# Patient Record
Sex: Female | Born: 1940
Health system: Southern US, Community
[De-identification: ages and names within clinical notes are randomized; demographics above are authoritative.]

## PROBLEM LIST (undated history)

## (undated) DIAGNOSIS — M1990 Unspecified osteoarthritis, unspecified site: Secondary | ICD-10-CM

## (undated) DIAGNOSIS — F3289 Other specified depressive episodes: Secondary | ICD-10-CM

## (undated) DIAGNOSIS — M899 Disorder of bone, unspecified: Secondary | ICD-10-CM

## (undated) DIAGNOSIS — T7840XA Allergy, unspecified, initial encounter: Secondary | ICD-10-CM

## (undated) DIAGNOSIS — K635 Polyp of colon: Secondary | ICD-10-CM

## (undated) DIAGNOSIS — K649 Unspecified hemorrhoids: Secondary | ICD-10-CM

## (undated) DIAGNOSIS — K5790 Diverticulosis of intestine, part unspecified, without perforation or abscess without bleeding: Secondary | ICD-10-CM

## (undated) DIAGNOSIS — E079 Disorder of thyroid, unspecified: Secondary | ICD-10-CM

## (undated) DIAGNOSIS — K219 Gastro-esophageal reflux disease without esophagitis: Secondary | ICD-10-CM

## (undated) DIAGNOSIS — K8020 Calculus of gallbladder without cholecystitis without obstruction: Secondary | ICD-10-CM

## (undated) DIAGNOSIS — K644 Residual hemorrhoidal skin tags: Secondary | ICD-10-CM

## (undated) DIAGNOSIS — E7800 Pure hypercholesterolemia, unspecified: Secondary | ICD-10-CM

## (undated) DIAGNOSIS — F411 Generalized anxiety disorder: Secondary | ICD-10-CM

## (undated) DIAGNOSIS — D649 Anemia, unspecified: Secondary | ICD-10-CM

## (undated) DIAGNOSIS — H409 Unspecified glaucoma: Secondary | ICD-10-CM

## (undated) DIAGNOSIS — H269 Unspecified cataract: Secondary | ICD-10-CM

## (undated) DIAGNOSIS — F329 Major depressive disorder, single episode, unspecified: Secondary | ICD-10-CM

## (undated) DIAGNOSIS — I10 Essential (primary) hypertension: Secondary | ICD-10-CM

## (undated) DIAGNOSIS — R7303 Prediabetes: Secondary | ICD-10-CM

## (undated) DIAGNOSIS — J309 Allergic rhinitis, unspecified: Secondary | ICD-10-CM

## (undated) DIAGNOSIS — M949 Disorder of cartilage, unspecified: Secondary | ICD-10-CM

## (undated) DIAGNOSIS — N6019 Diffuse cystic mastopathy of unspecified breast: Secondary | ICD-10-CM

## (undated) HISTORY — DX: Allergy, unspecified, initial encounter: T78.40XA

## (undated) HISTORY — DX: Unspecified cataract: H26.9

## (undated) HISTORY — PX: BREAST EXCISIONAL BIOPSY: SUR124

## (undated) HISTORY — DX: Generalized anxiety disorder: F41.1

## (undated) HISTORY — DX: Disorder of bone, unspecified: M94.9

## (undated) HISTORY — DX: Gastro-esophageal reflux disease without esophagitis: K21.9

## (undated) HISTORY — DX: Anemia, unspecified: D64.9

## (undated) HISTORY — DX: Unspecified glaucoma: H40.9

## (undated) HISTORY — DX: Other specified depressive episodes: F32.89

## (undated) HISTORY — DX: Disorder of thyroid, unspecified: E07.9

## (undated) HISTORY — DX: Unspecified hemorrhoids: K64.9

## (undated) HISTORY — DX: Polyp of colon: K63.5

## (undated) HISTORY — PX: TONSILLECTOMY: SUR1361

## (undated) HISTORY — DX: Diverticulosis of intestine, part unspecified, without perforation or abscess without bleeding: K57.90

## (undated) HISTORY — DX: Calculus of gallbladder without cholecystitis without obstruction: K80.20

## (undated) HISTORY — DX: Allergic rhinitis, unspecified: J30.9

## (undated) HISTORY — DX: Pure hypercholesterolemia, unspecified: E78.00

## (undated) HISTORY — DX: Essential (primary) hypertension: I10

## (undated) HISTORY — DX: Unspecified osteoarthritis, unspecified site: M19.90

## (undated) HISTORY — DX: Major depressive disorder, single episode, unspecified: F32.9

## (undated) HISTORY — PX: POLYPECTOMY: SHX149

## (undated) HISTORY — PX: CHOLECYSTECTOMY: SHX55

## (undated) HISTORY — DX: Diffuse cystic mastopathy of unspecified breast: N60.19

## (undated) HISTORY — DX: Disorder of bone, unspecified: M89.9

## (undated) HISTORY — DX: Residual hemorrhoidal skin tags: K64.4

## (undated) HISTORY — PX: HAND SURGERY: SHX662

## (undated) HISTORY — DX: Disorder of cartilage, unspecified: M89.9

## (undated) HISTORY — PX: HEMORRHOID SURGERY: SHX153

---

## 1998-06-16 ENCOUNTER — Other Ambulatory Visit: Admission: RE | Admit: 1998-06-16 | Discharge: 1998-06-16 | Payer: Self-pay | Admitting: Obstetrics and Gynecology

## 1999-05-02 ENCOUNTER — Other Ambulatory Visit: Admission: RE | Admit: 1999-05-02 | Discharge: 1999-05-02 | Payer: Self-pay | Admitting: Obstetrics and Gynecology

## 1999-05-02 ENCOUNTER — Encounter (INDEPENDENT_AMBULATORY_CARE_PROVIDER_SITE_OTHER): Payer: Self-pay | Admitting: Specialist

## 1999-10-14 HISTORY — PX: BUNIONECTOMY: SHX129

## 2000-03-18 ENCOUNTER — Other Ambulatory Visit: Admission: RE | Admit: 2000-03-18 | Discharge: 2000-03-18 | Payer: Self-pay | Admitting: Obstetrics and Gynecology

## 2000-06-13 HISTORY — PX: OTHER SURGICAL HISTORY: SHX169

## 2001-05-21 ENCOUNTER — Other Ambulatory Visit: Admission: RE | Admit: 2001-05-21 | Discharge: 2001-05-21 | Payer: Self-pay | Admitting: Family Medicine

## 2002-01-13 HISTORY — PX: COLONOSCOPY: SHX174

## 2003-04-16 HISTORY — PX: OTHER SURGICAL HISTORY: SHX169

## 2003-05-04 ENCOUNTER — Other Ambulatory Visit: Admission: RE | Admit: 2003-05-04 | Discharge: 2003-05-04 | Payer: Self-pay | Admitting: Family Medicine

## 2003-07-07 ENCOUNTER — Other Ambulatory Visit: Payer: Self-pay

## 2004-03-07 ENCOUNTER — Ambulatory Visit (HOSPITAL_COMMUNITY): Admission: RE | Admit: 2004-03-07 | Discharge: 2004-03-07 | Payer: Self-pay | Admitting: Obstetrics and Gynecology

## 2004-03-07 ENCOUNTER — Encounter (INDEPENDENT_AMBULATORY_CARE_PROVIDER_SITE_OTHER): Payer: Self-pay | Admitting: Specialist

## 2004-06-06 ENCOUNTER — Ambulatory Visit: Payer: Self-pay | Admitting: Family Medicine

## 2004-07-04 ENCOUNTER — Other Ambulatory Visit: Admission: RE | Admit: 2004-07-04 | Discharge: 2004-07-04 | Payer: Self-pay | Admitting: Family Medicine

## 2004-07-04 ENCOUNTER — Ambulatory Visit: Payer: Self-pay | Admitting: Family Medicine

## 2004-07-04 LAB — CONVERTED CEMR LAB: Pap Smear: NORMAL

## 2004-07-10 ENCOUNTER — Ambulatory Visit: Payer: Self-pay | Admitting: Family Medicine

## 2004-07-20 ENCOUNTER — Ambulatory Visit: Payer: Self-pay | Admitting: Family Medicine

## 2005-01-04 ENCOUNTER — Ambulatory Visit: Payer: Self-pay | Admitting: Family Medicine

## 2005-02-14 ENCOUNTER — Ambulatory Visit: Payer: Self-pay | Admitting: Family Medicine

## 2005-03-27 ENCOUNTER — Ambulatory Visit: Payer: Self-pay | Admitting: Family Medicine

## 2005-04-26 ENCOUNTER — Ambulatory Visit: Payer: Self-pay | Admitting: Internal Medicine

## 2005-05-10 ENCOUNTER — Ambulatory Visit: Payer: Self-pay | Admitting: Internal Medicine

## 2005-08-26 ENCOUNTER — Ambulatory Visit: Payer: Self-pay | Admitting: Family Medicine

## 2005-11-13 ENCOUNTER — Ambulatory Visit: Payer: Self-pay | Admitting: Family Medicine

## 2006-03-13 ENCOUNTER — Ambulatory Visit: Payer: Self-pay | Admitting: Family Medicine

## 2006-04-15 DIAGNOSIS — N6019 Diffuse cystic mastopathy of unspecified breast: Secondary | ICD-10-CM

## 2006-04-15 HISTORY — DX: Diffuse cystic mastopathy of unspecified breast: N60.19

## 2006-10-24 ENCOUNTER — Encounter: Payer: Self-pay | Admitting: Family Medicine

## 2006-10-24 DIAGNOSIS — F411 Generalized anxiety disorder: Secondary | ICD-10-CM | POA: Insufficient documentation

## 2006-10-24 DIAGNOSIS — K649 Unspecified hemorrhoids: Secondary | ICD-10-CM | POA: Insufficient documentation

## 2006-10-24 DIAGNOSIS — M1990 Unspecified osteoarthritis, unspecified site: Secondary | ICD-10-CM | POA: Insufficient documentation

## 2006-10-24 DIAGNOSIS — F418 Other specified anxiety disorders: Secondary | ICD-10-CM

## 2006-10-24 DIAGNOSIS — N6019 Diffuse cystic mastopathy of unspecified breast: Secondary | ICD-10-CM

## 2006-10-24 DIAGNOSIS — E7800 Pure hypercholesterolemia, unspecified: Secondary | ICD-10-CM | POA: Insufficient documentation

## 2006-10-24 DIAGNOSIS — I10 Essential (primary) hypertension: Secondary | ICD-10-CM

## 2006-11-03 ENCOUNTER — Ambulatory Visit: Payer: Self-pay | Admitting: Family Medicine

## 2007-01-05 ENCOUNTER — Ambulatory Visit: Payer: Self-pay | Admitting: Family Medicine

## 2007-01-05 DIAGNOSIS — M810 Age-related osteoporosis without current pathological fracture: Secondary | ICD-10-CM | POA: Insufficient documentation

## 2007-01-05 DIAGNOSIS — M8580 Other specified disorders of bone density and structure, unspecified site: Secondary | ICD-10-CM

## 2007-01-07 LAB — CONVERTED CEMR LAB
ALT: 17 units/L (ref 0–35)
AST: 21 units/L (ref 0–37)
Albumin: 3.9 g/dL (ref 3.5–5.2)
Alkaline Phosphatase: 73 units/L (ref 39–117)
BUN: 20 mg/dL (ref 6–23)
Basophils Absolute: 0 10*3/uL (ref 0.0–0.1)
Basophils Relative: 0 % (ref 0.0–1.0)
Bilirubin, Direct: 0.1 mg/dL (ref 0.0–0.3)
CO2: 29 meq/L (ref 19–32)
Calcium: 10.4 mg/dL (ref 8.4–10.5)
Chloride: 102 meq/L (ref 96–112)
Cholesterol: 180 mg/dL (ref 0–200)
Creatinine, Ser: 0.8 mg/dL (ref 0.4–1.2)
Eosinophils Absolute: 0.1 10*3/uL (ref 0.0–0.6)
Eosinophils Relative: 1.8 % (ref 0.0–5.0)
GFR calc Af Amer: 93 mL/min
GFR calc non Af Amer: 77 mL/min
Glucose, Bld: 85 mg/dL (ref 70–99)
HCT: 35.9 % — ABNORMAL LOW (ref 36.0–46.0)
HDL: 40.9 mg/dL (ref >39.0)
Hemoglobin: 12.3 g/dL (ref 12.0–15.0)
LDL Cholesterol: 122 mg/dL — ABNORMAL HIGH (ref 0–99)
Lymphocytes Relative: 43.8 % (ref 12.0–46.0)
MCHC: 34.1 g/dL (ref 30.0–36.0)
MCV: 95.3 fL (ref 78.0–100.0)
Monocytes Absolute: 0.6 10*3/uL (ref 0.2–0.7)
Monocytes Relative: 11.5 % — ABNORMAL HIGH (ref 3.0–11.0)
Neutro Abs: 2.1 10*3/uL (ref 1.4–7.7)
Neutrophils Relative %: 42.9 % — ABNORMAL LOW (ref 43.0–77.0)
Platelets: 228 10*3/uL (ref 150–400)
Potassium: 4.3 meq/L (ref 3.5–5.1)
RBC: 3.77 M/uL — ABNORMAL LOW (ref 3.87–5.11)
RDW: 13.1 % (ref 11.5–14.6)
Sodium: 140 meq/L (ref 135–145)
TSH: 2.04 microintl units/mL (ref 0.35–5.50)
Total Bilirubin: 0.8 mg/dL (ref 0.3–1.2)
Total CHOL/HDL Ratio: 4.4
Total Protein: 6.6 g/dL (ref 6.0–8.3)
Triglycerides: 84 mg/dL (ref 0–149)
VLDL: 17 mg/dL (ref 0–40)
WBC: 5 10*3/uL (ref 4.5–10.5)

## 2007-03-26 ENCOUNTER — Ambulatory Visit: Payer: Self-pay | Admitting: Family Medicine

## 2007-07-28 ENCOUNTER — Telehealth: Payer: Self-pay | Admitting: Family Medicine

## 2007-09-23 ENCOUNTER — Encounter: Admission: RE | Admit: 2007-09-23 | Discharge: 2007-09-23 | Payer: Self-pay | Admitting: Orthopedic Surgery

## 2007-11-16 ENCOUNTER — Telehealth (INDEPENDENT_AMBULATORY_CARE_PROVIDER_SITE_OTHER): Payer: Self-pay | Admitting: *Deleted

## 2007-11-16 ENCOUNTER — Encounter: Payer: Self-pay | Admitting: Family Medicine

## 2008-02-26 ENCOUNTER — Ambulatory Visit: Payer: Self-pay | Admitting: Family Medicine

## 2008-03-02 ENCOUNTER — Encounter: Payer: Self-pay | Admitting: Family Medicine

## 2008-03-02 ENCOUNTER — Other Ambulatory Visit: Admission: RE | Admit: 2008-03-02 | Discharge: 2008-03-02 | Payer: Self-pay | Admitting: Family Medicine

## 2008-03-02 ENCOUNTER — Ambulatory Visit: Payer: Self-pay | Admitting: Family Medicine

## 2008-03-02 DIAGNOSIS — J309 Allergic rhinitis, unspecified: Secondary | ICD-10-CM | POA: Insufficient documentation

## 2008-03-07 ENCOUNTER — Ambulatory Visit: Payer: Self-pay | Admitting: Family Medicine

## 2008-03-08 LAB — CONVERTED CEMR LAB
ALT: 15 U/L
AST: 18 U/L
Albumin: 3.5 g/dL
Alkaline Phosphatase: 59 U/L
BUN: 24 mg/dL — ABNORMAL HIGH
Basophils Absolute: 0 K/uL
Basophils Relative: 0.4 %
Bilirubin, Direct: 0.1 mg/dL
CO2: 30 meq/L
Calcium: 9.4 mg/dL
Chloride: 104 meq/L
Cholesterol: 165 mg/dL
Creatinine, Ser: 0.7 mg/dL
Eosinophils Absolute: 0 K/uL
Eosinophils Relative: 1 %
GFR calc Af Amer: 108 mL/min
GFR calc non Af Amer: 89 mL/min
Glucose, Bld: 84 mg/dL
HCT: 37.1 %
HDL: 40.4 mg/dL
Hemoglobin: 12.8 g/dL
LDL Cholesterol: 114 mg/dL — ABNORMAL HIGH
Lymphocytes Relative: 40 %
MCHC: 34.4 g/dL
MCV: 93.1 fL
Monocytes Absolute: 0.6 K/uL
Monocytes Relative: 11.2 %
Neutro Abs: 2.4 K/uL
Neutrophils Relative %: 47.4 %
Platelets: 208 K/uL
Potassium: 4.4 meq/L
RBC: 3.98 M/uL
RDW: 12.4 %
Sodium: 139 meq/L
TSH: 1.65 u[IU]/mL
Total Bilirubin: 0.9 mg/dL
Total CHOL/HDL Ratio: 4.1
Total Protein: 6.7 g/dL
Triglycerides: 53 mg/dL
VLDL: 11 mg/dL
WBC: 5 10*3/microliter

## 2008-03-09 LAB — CONVERTED CEMR LAB: Vit D, 1,25-Dihydroxy: 49 (ref 30–89)

## 2008-04-07 ENCOUNTER — Ambulatory Visit: Payer: Self-pay | Admitting: Family Medicine

## 2008-04-07 LAB — CONVERTED CEMR LAB
OCCULT 1: NEGATIVE
OCCULT 2: NEGATIVE
OCCULT 3: NEGATIVE

## 2008-04-18 ENCOUNTER — Encounter: Payer: Self-pay | Admitting: Family Medicine

## 2008-04-25 ENCOUNTER — Encounter (INDEPENDENT_AMBULATORY_CARE_PROVIDER_SITE_OTHER): Payer: Self-pay | Admitting: *Deleted

## 2008-08-22 ENCOUNTER — Telehealth: Payer: Self-pay | Admitting: Family Medicine

## 2009-02-16 ENCOUNTER — Ambulatory Visit: Payer: Self-pay | Admitting: Family Medicine

## 2009-04-10 ENCOUNTER — Telehealth: Payer: Self-pay | Admitting: Family Medicine

## 2009-12-19 ENCOUNTER — Telehealth: Payer: Self-pay | Admitting: Family Medicine

## 2010-02-23 ENCOUNTER — Telehealth (INDEPENDENT_AMBULATORY_CARE_PROVIDER_SITE_OTHER): Payer: Self-pay | Admitting: *Deleted

## 2010-02-26 ENCOUNTER — Ambulatory Visit: Payer: Self-pay | Admitting: Family Medicine

## 2010-02-26 LAB — CONVERTED CEMR LAB
ALT: 13 units/L (ref 0–35)
Basophils Absolute: 0 10*3/uL (ref 0.0–0.1)
Calcium: 9.6 mg/dL (ref 8.4–10.5)
Creatinine, Ser: 0.8 mg/dL (ref 0.4–1.2)
Glucose, Bld: 101 mg/dL — ABNORMAL HIGH (ref 70–99)
HCT: 35.9 % — ABNORMAL LOW (ref 36.0–46.0)
HDL: 49.3 mg/dL (ref >39.00)
Lymphs Abs: 1.5 10*3/uL (ref 0.7–4.0)
MCV: 94.7 fL (ref 78.0–100.0)
Monocytes Absolute: 0.4 10*3/uL (ref 0.1–1.0)
Neutrophils Relative %: 47.4 % (ref 43.0–77.0)
Phosphorus: 3.5 mg/dL (ref 2.3–4.6)
Platelets: 196 10*3/uL (ref 150.0–400.0)
Potassium: 4.5 meq/L (ref 3.5–5.1)
RDW: 13.7 % (ref 11.5–14.6)
Sodium: 139 meq/L (ref 135–145)
TSH: 2.4 microintl units/mL (ref 0.35–5.50)
Total Bilirubin: 0.6 mg/dL (ref 0.3–1.2)
Total CHOL/HDL Ratio: 4
Total Protein: 6.4 g/dL (ref 6.0–8.3)
Triglycerides: 71 mg/dL (ref 0.0–149.0)
VLDL: 14.2 mg/dL (ref 0.0–40.0)

## 2010-02-28 ENCOUNTER — Ambulatory Visit: Payer: Self-pay | Admitting: Family Medicine

## 2010-03-09 ENCOUNTER — Encounter: Admission: RE | Admit: 2010-03-09 | Discharge: 2010-03-09 | Payer: Self-pay | Admitting: Family Medicine

## 2010-03-09 LAB — HM MAMMOGRAPHY: HM Mammogram: NORMAL

## 2010-03-15 ENCOUNTER — Encounter: Payer: Self-pay | Admitting: Family Medicine

## 2010-04-13 ENCOUNTER — Encounter: Payer: Self-pay | Admitting: Internal Medicine

## 2010-05-14 ENCOUNTER — Ambulatory Visit
Admission: RE | Admit: 2010-05-14 | Discharge: 2010-05-14 | Payer: Self-pay | Source: Home / Self Care | Attending: Family Medicine | Admitting: Family Medicine

## 2010-05-14 DIAGNOSIS — J069 Acute upper respiratory infection, unspecified: Secondary | ICD-10-CM | POA: Insufficient documentation

## 2010-05-15 NOTE — Letter (Signed)
Summary: Results Follow up Letter  [place] [facility]  [address] [place], [place] [address]   Phone: [phone]  Fax: [phone]    03/15/2010 MRN: [id number]  RITTA HAMMES [address] [place], [place]  [address]  Dear Ms. GLADWIN,  The following are the results of your recent test(s):  Test         Result    Pap Smear:        Normal _____  Not Normal _____ Comments: ______________________________________________________ Cholesterol: LDL(Bad cholesterol):         Your goal is less than:         HDL (Good cholesterol):       Your goal is more than: Comments:  ______________________________________________________ Mammogram:        Normal __X___  Not Normal _____ Comments: Please repeat in one year.  ___________________________________________________________________ Hemoccult:        Normal _____  Not normal _______ Comments:    _____________________________________________________________________ Other Tests:    We routinely do not discuss normal results over the telephone.  If you desire a copy of the results, or you have any questions about this information we can discuss them at your next office visit.   Sincerely,     Dr. Roxy Manns

## 2010-05-15 NOTE — Progress Notes (Signed)
----   Converted from flag ---- ---- 02/22/2010 6:41 PM, Colon Flattery Tower MD wrote: please check renal / hepatic, cbc with diff/ tsh and lipid for 401.1 and 272  ---- 02/22/2010 1:24 PM, Liane Comber CMA (AAMA) wrote: Lab orders please! Good Morning! This pt is scheduled for cpx labs Monday, which labs to draw and dx codes to use? Thanks Tasha ------------------------------

## 2010-05-15 NOTE — Assessment & Plan Note (Signed)
Summary: CPX/RI   Vital Signs:  Patient profile:   70 year old female Height:      61.5 inches Weight:      133.12 pounds BMI:     24.83 Temp:     98.4 degrees F oral Pulse rate:   64 / minute Pulse rhythm:   regular BP sitting:   120 / 78  (left arm) Cuff size:   large  Vitals Entered By: Benny Lennert CMA Duncan Dull) (February 28, 2010 10:34 AM)  History of Present Illness: Chief complaint management of medications and check up of chronic med problems also rev health mt list   is doing very well    wt is stable with bmi 24  120/78- great HTN control   osteopenia - dexa 05-- due for bone density test  ca and D-- is good with that  is exercising - walking and swimming    colonosc 07- r echeck 5 y with hx of polyp and fam hx of ca  pap 09 no symptoms   mam 1/10  is overdue  self exam = no lumps , but has fibrocystic    td 07 flu shot today pneumovax  zoster status  good labs chol up a bit with LDL 134 from 114 is still eating very very well - totally low fat  no red meat or fried foods , does eat egg yolks  loves shrimp    Allergies (verified): No Known Drug Allergies  Past History:  Past Medical History: Last updated: 03/02/2008 Anxiety Depression Hypertension Osteoarthritis- hands osteopenia  colon polyps all rhinitis- post nasal drip  Past Surgical History: Last updated: 03/02/2008 Cholecystectomy Hemorrhoidectomy Hand surgery, DIP fusion- left Bunion surgery left foot (10/1999) Dexa- osteopenia (06/2000) Colonoscopy- polyp, ext hemorrhoids, diverticulosis (01/2002) Dexa- stable (04/2003) Colonoscopy- diverticulosis (04/2005)  Family History: Last updated: 03/02/2008 Father: arthritis, lung cancer- smoker Mother: stroke, CAD- at 51 Siblings: none MGM hemm cva, uterine ca P uncle bone ca brother - colon and liver cancer (? primary colon )   Social History: Last updated: 03/02/2008 Marital Status: Married Children:  2 Occupation: works for [employer]  Never Smoked occasional alcohol - wine   Risk Factors: Smoking Status: never (02/26/2008)  Review of Systems General:  Denies fatigue, loss of appetite, and malaise. Eyes:  Denies blurring and eye irritation. CV:  Denies chest pain or discomfort, lightheadness, and palpitations. Resp:  Denies cough and shortness of breath. GI:  Denies abdominal pain, change in bowel habits, indigestion, and nausea. GU:  Denies discharge and urinary frequency. MS:  Denies joint pain, joint swelling, muscle aches, cramps, and muscle weakness. Derm:  Denies itching, lesion(s), poor wound healing, and rash. Neuro:  Denies numbness and tingling. Psych:  Complains of depression; denies anxiety; depression is very well controlled on current med . Endo:  Denies cold intolerance, excessive thirst, excessive urination, and heat intolerance. Heme:  Denies abnormal bruising and bleeding.  Physical Exam  Breasts:  No mass, nodules, thickening, tenderness, bulging, retraction, inflamation, nipple discharge or skin changes noted.   Skin:  small seb cyst on back of neck   Impression & Recommendations:  Problem # 1:  OTHER SCREENING MAMMOGRAM (ICD-V76.12) Assessment Comment Only annual mammogram scheduled adv pt to continue regular self breast exams non remarkable breast exam today  Orders: Radiology Referral (Radiology)  Problem # 2:  OSTEOPENIA (ICD-733.90) Assessment: Unchanged hold evista since on it for at least 5 y check dexa rev ca and vit D and exercise req Her  updated medication list for this problem includes:    Evista 60 Mg Tabs (Raloxifene hcl) .Marland Kitchen... Take one by mouth daily  Orders: Radiology Referral (Radiology)  Problem # 3:  Hx of HYPERCHOLESTEROLEMIA (ICD-272.0) Assessment: Deteriorated  rev labs LDL is up despite the good diet  ? genetic  continue to watch over time and rec statin in future if needed rev low sat fat diet   Labs  Reviewed: SGOT: 18 (02/26/2010)   SGPT: 13 (02/26/2010)   HDL:49.30 (02/26/2010), 40.4 (03/07/2008)  LDL:134 (02/26/2010), 114 (03/07/2008)  Chol:197 (02/26/2010), 165 (03/07/2008)  Trig:71.0 (02/26/2010), 53 (03/07/2008)  Problem # 4:  HYPERTENSION (ICD-401.9) Assessment: Unchanged  good control after wt loss and with lifestyle  will continue to follow  labs rev with pt   BP today: 120/78 Prior BP: 130/84 (03/02/2008)  Labs Reviewed: K+: 4.5 (02/26/2010) Creat: : 0.8 (02/26/2010)   Chol: 197 (02/26/2010)   HDL: 49.30 (02/26/2010)   LDL: 134 (02/26/2010)   TG: 71.0 (02/26/2010)  Problem # 5:  DEPRESSION (ICD-311) Assessment: Unchanged very well controlled with prozac - will continue without change  enc further exercise  Her updated medication list for this problem includes:    Prozac 20 Mg Caps (Fluoxetine hcl) .Marland Kitchen... Take one by mouth daily  Complete Medication List: 1)  Allegra 60 Mg Tabs (Fexofenadine hcl) .... Take one by mouth daily 2)  Prozac 20 Mg Caps (Fluoxetine hcl) .... Take one by mouth daily 3)  Evista 60 Mg Tabs (Raloxifene hcl) .... Take one by mouth daily 4)  Calcium With Vit D 1200 Mg  .... Take by mouth as directed 5)  Multiple Vitamins Tabs (Multiple vitamin) .... Take by mouth as directed 6)  Naprosyn 500 Mg Tabs (Naproxen) .Marland Kitchen.. 1 by mouth two times a day prn 7)  Fish Oil 300 Mg Caps (Omega-3 fatty acids) .... One tablet daily  Other Orders: Flu Vaccine 13yrs + MEDICARE PATIENTS (T5573) Administration Flu vaccine - MCR (G0008) Pneumococcal Vaccine (22025) Admin 1st Vaccine (42706)  Patient Instructions: 1)  you can raise your HDL (good cholesterol) by increasing exercise and eating omega 3 fatty acid supplement like fish oil or flax seed oil over the counter 2)  you can lower LDL (bad cholesterol) by limiting saturated fats in diet like red meat, fried foods, egg yolks, fatty breakfast meats, high fat dairy products and shellfish  3)  if you are interested  in shingles vaccine - call your ins co 4)  call us to schedule in jan/ feb if you want one  5)  we will schedule mammogram at check out  Prescriptions: PROZAC 20 MG  CAPS (FLUOXETINE HCL) take one by mouth daily  #90 x 3   Entered and Authorized by:   Judith Part MD   Signed by:   Judith Part MD on 02/28/2010   Method used:   Print then Give to Patient   RxID:   [id number]    Orders Added: 1)  Flu Vaccine 7yrs + MEDICARE PATIENTS [Q2039] 2)  Administration Flu vaccine - MCR [G0008] 3)  Pneumococcal Vaccine [90732] 4)  Admin 1st Vaccine [37106] 5)  Radiology Referral [Radiology] 6)  Radiology Referral [Radiology] 7)  Est. Patient Level IV [26948]   Immunizations Administered:  Pneumonia Vaccine:    Vaccine Type: Pneumovax (Medicare)    Site: right deltoid    Mfr: Merck    Dose: 0.5 ml    Route: [place]    Given by: Benny Lennert CMA (AAMA)  Exp. Date: 08/10/2011    Lot #: [id number]    VIS given: 03/20/09 version given February 28, 2010.   Immunizations Administered:  Pneumonia Vaccine:    Vaccine Type: Pneumovax (Medicare)    Site: right deltoid    Mfr: Merck    Dose: 0.5 ml    Route: [place]    Given by: Benny Lennert CMA (AAMA)    Exp. Date: 08/10/2011    Lot #: [id number]    VIS given: 03/20/09 version given February 28, 2010.  Current Allergies (reviewed today): No known allergies              Flu Vaccine Consent Questions     Do you have a history of severe allergic reactions to this vaccine? no    Any prior history of allergic reactions to egg and/or gelatin? no    Do you have a sensitivity to the preservative Thimersol? no    Do you have a past history of Guillan-Barre Syndrome? no    Do you currently have an acute febrile illness? no    Have you ever had a severe reaction to latex? no    Vaccine information given and explained to patient? yes    Are you currently pregnant? no    Lot Number:[id number]   Exp Date:10/13/2010   Site Given   Left Deltoid [email or web address]

## 2010-05-15 NOTE — Progress Notes (Signed)
Summary: Fluoxetine 20mg   Phone Note Refill Request Call back at [phone] Message from:  [facility] [address] on December 19, 2009 4:30 PM  Refills Requested: Medication #1:  PROZAC 20 MG  CAPS take one by mouth daily   Last Refilled: 10/23/2009 [facility] [address] electronically request refill on Prozac 20mg . last refilled 10/23/09. I called pt and scheduled CPX on Mon 12/25/09 at 9:30am.Please advise. Lewanda Rife LPN  December 19, 2009 4:38 PM    Method Requested: Telephone to Pharmacy Initial call taken by: Lewanda Rife LPN,  December 19, 2009 4:38 PM  Follow-up for Phone Call        Medication phoned to [facility] [address] pharmacy as instructed. Lewanda Rife LPN  December 19, 2009 5:13 PM     New/Updated Medications: PROZAC 20 MG  CAPS (FLUOXETINE HCL) take one by mouth daily Prescriptions: PROZAC 20 MG  CAPS (FLUOXETINE HCL) take one by mouth daily  #30 x 1   Entered and Authorized by:   Judith Part MD   Signed by:   Lewanda Rife LPN on 45/40/9811   Method used:   Telephoned to ...       [facility] [address] (retail)       [address], [address]       [place], [place]  [address]       Ph: [phone]       Fax: [phone]   RxID:   [id number]

## 2010-05-17 NOTE — Letter (Signed)
Summary: Colonoscopy Letter  [facility]  [address] [place], [place] [address]   Phone: [phone]  Fax: [phone]      April 13, 2010 MRN: [id number]   DENELL COTHERN [address] [place], [place]  [address]   Dear Ms. CATALFAMO,   According to your medical record, it is time for you to schedule a Colonoscopy. The American Cancer Society recommends this procedure as a method to detect early colon cancer. Patients with a family history of colon cancer, or a personal history of colon polyps or inflammatory bowel disease are at increased risk.  This letter has been generated based on the recommendations made at the time of your procedure. If you feel that in your particular situation this may no longer apply, please contact our office.  Please call our office at [phone] to schedule this appointment or to update your records at your earliest convenience.  Thank you for cooperating with [place] to provide you with the very best care possible.   Sincerely,  Wilhemina Bonito. Marina Goodell, M.D.  [facility] [phone]

## 2010-05-22 ENCOUNTER — Telehealth: Payer: Self-pay | Admitting: Family Medicine

## 2010-05-23 NOTE — Assessment & Plan Note (Signed)
Summary: DISCUSS BONE DENSITY,ZOSTAVAX/CLE   Vital Signs:  Patient profile:   70 year old female Height:      61.5 inches Weight:      137.50 pounds BMI:     25.65 Temp:     98.4 degrees F oral Pulse rate:   60 / minute Pulse rhythm:   regular BP sitting:   142 / 80  (left arm) Cuff size:   large  Vitals Entered By: Lewanda Rife LPN (May 14, 2010 4:03 PM) CC: discuss bone density, head congested, when blows nose has yellow mucus and rt eye watering with non productive cough..   History of Present Illness: dexa shows osteopenia - slt worse since last check  LS -1.6 T score FN is -1.8  is good about ca and exercise no broken bones  no one in family has OP she is petite -- that is a risk factor   never been on bisphosphenates no gerd  never had jaw tumor  no major dental problems   used to have acid reflux before weight loss would rather avoid med if possible - bisphosphenates   she was on evista for over 5 years   calcium and vit D  bad cold -- with congestion and yellow mucous  very watery r eye  also blew a blood vessel in that eye -- last week  no colored discharge from her eye  sneezing and blowing nose a lot  nyquil and dayquil is getting better now  no fever now - getting better   will not get shingles shot since is sick    Allergies (verified): No Known Drug Allergies  Past History:  Past Medical History: Last updated: 03/02/2008 Anxiety Depression Hypertension Osteoarthritis- hands osteopenia  colon polyps all rhinitis- post nasal drip  Past Surgical History: Last updated: 03/02/2008 Cholecystectomy Hemorrhoidectomy Hand surgery, DIP fusion- left Bunion surgery left foot (10/1999) Dexa- osteopenia (06/2000) Colonoscopy- polyp, ext hemorrhoids, diverticulosis (01/2002) Dexa- stable (04/2003) Colonoscopy- diverticulosis (04/2005)  Family History: Last updated: 03/02/2008 Father: arthritis, lung cancer- smoker Mother: stroke,  CAD- at 3 Siblings: none MGM hemm cva, uterine ca P uncle bone ca brother - colon and liver cancer (? primary colon )   Social History: Last updated: 03/02/2008 Marital Status: Married Children: 2 Occupation: works for [employer]  Never Smoked occasional alcohol - wine   Risk Factors: Smoking Status: never (02/26/2008)  Review of Systems General:  Complains of fatigue; denies chills and fever. Eyes:  Denies blurring and eye irritation. ENT:  Complains of hoarseness, nasal congestion, postnasal drainage, sinus pressure, and sore throat; denies earache. CV:  Denies chest pain or discomfort and palpitations. Resp:  Complains of cough; denies pleuritic and sputum productive. GI:  Denies abdominal pain, indigestion, nausea, and vomiting. GU:  Denies urinary frequency. Derm:  Denies itching, lesion(s), poor wound healing, and rash. Neuro:  Denies numbness and tingling. Endo:  Denies excessive thirst and excessive urination. Heme:  Denies abnormal bruising and bleeding.  Physical Exam  General:  Well-developed,well-nourished,in no acute distress; alert,appropriate and cooperative throughout examination Head:  normocephalic, atraumatic, and no abnormalities observed.  no sinus tenderness Eyes:  resolving conj hemorrhage in R eye medially some clear tearing Ears:  R ear normal and L ear normal.   Nose:  nares are injected and congested bilaterally  Mouth:  pharynx pink and moist, no erythema, and no exudates.  some post nasal drip Neck:  supple with full rom and no masses or thyromegally, no JVD or carotid  bruit  Chest Wall:  No deformities, masses, or tenderness noted. Lungs:  Normal respiratory effort, chest expands symmetrically. Lungs are clear to auscultation, no crackles or wheezes. Msk:  petite frame no kyphosis  Skin:  Intact without suspicious lesions or rashes Cervical Nodes:  No lymphadenopathy noted Psych:  normal affect, talkative and pleasant    Impression &  Recommendations:  Problem # 1:  OSTEOPENIA (ICD-733.90) Assessment Deteriorated had a long discussion about osteopenia and choices for tx  no major risk factors except petite frame  off evista since over 5 years disc opt of bisphosphenate pt choose to hold off for now and continue ca/ d / exercise will re check dexa 2 y disc safety as well given handout from [facility]af[facility] on OP  The following medications were removed from the medication list:    Evista 60 Mg Tabs (Raloxifene hcl) .Marland Kitchen... Take one by mouth daily  Problem # 2:  VIRAL URI (ICD-465.9) Assessment: New with watery eyes (worse on R due to recent conj hem)  recommend sympt care- see pt instructions   pt advised to update me if symptoms worsen or do not improve will put off zostavax until better with this  Her updated medication list for this problem includes:    Allegra 60 Mg Tabs (Fexofenadine hcl) .Marland Kitchen... Take one by mouth daily    Naprosyn 500 Mg Tabs (Naproxen) .Marland Kitchen... 1 by mouth two times a day as needed  Problem # 3:  Preventive Health Care (ICD-V70.0) Assessment: Comment Only will return for zostavax later when completely over her uri  Complete Medication List: 1)  Allegra 60 Mg Tabs (Fexofenadine hcl) .... Take one by mouth daily 2)  Prozac 20 Mg Caps (Fluoxetine hcl) .... Take one by mouth daily 3)  Calcium With Vit D 1200 Mg  .... Take by mouth as directed 4)  Multiple Vitamins Tabs (Multiple vitamin) .... Take by mouth as directed 5)  Naprosyn 500 Mg Tabs (Naproxen) .Marland Kitchen.. 1 by mouth two times a day as needed 6)  Fish Oil 300 Mg Caps (Omega-3 fatty acids) .... One tablet daily  Patient Instructions: 1)  continue calcium and vitamin d  2)  continue good exercise  3)  we will plan to check bone density again in about 2 years  4)  you can try mucinex over the counter twice daily as directed and nasal saline spray for congestion 5)  tylenol over the counter as directed may help with aches, headache and fever 6)  call if  symptoms worsen or if not improved in 4-5 days  7)  if eye worsens - let me or your eye doctor know    Orders Added: 1)  Est. Patient Level IV [04540]    Current Allergies (reviewed today): No known allergies

## 2010-05-25 ENCOUNTER — Encounter: Payer: Self-pay | Admitting: Family Medicine

## 2010-05-31 NOTE — Progress Notes (Signed)
Summary: wants rx for shingles vaccine  Phone Note Call from Patient Call back at [phone]   Caller: Patient Call For: Judith Part MD Summary of Call: Patient is asking for wrtten rx for the shingles vaccine to pick up. She has already called her ins to verify that is is covered. Please call patient when rx is ready. Initial call taken by: Melody Comas,  May 22, 2010 4:06 PM  Follow-up for Phone Call        printed in put in nurse in box for pickup  Follow-up by: Judith Part MD,  May 22, 2010 4:53 PM  Additional Follow-up for Phone Call Additional follow up Details #1::        Patient notified as instructed by telephone. Prescription left at front desk. Lewanda Rife LPN  May 23, 2010 11:23 AM     New/Updated Medications: ZOSTAVAX 29562 UNT/0.65ML SOLR (ZOSTER VACCINE LIVE) inject times one as directed Prescriptions: ZOSTAVAX 13086 UNT/0.65ML SOLR (ZOSTER VACCINE LIVE) inject times one as directed  #1 dose x 0   Entered and Authorized by:   Judith Part MD   Signed by:   Judith Part MD on 05/22/2010   Method used:   Print then Give to Patient   RxID:   [id number]

## 2010-05-31 NOTE — Miscellaneous (Signed)
Summary: Zostavax    Clinical Lists Changes  Observations: Added new observation of ZOSTAVAX: Zostavax (05/24/2010 15:45)      Immunization History:  Zostavax History:    Zostavax:  zostavax (05/24/2010) Zostavax was given 05/24/10 at [facility] [phone]. Lewanda Rife LPN  May 25, 2010 3:46 PM

## 2010-08-31 NOTE — Op Note (Signed)
NAME:  Sierra Sparks, DUET NO.:  [id number]   MEDICAL RECORD NO.:  [id number]          PATIENT TYPE:  AMB   LOCATION:  SDC                           FACILITY:  [facility]   PHYSICIAN:  Miguel Aschoff, M.D.       DATE OF BIRTH:  08/28/1940   DATE OF PROCEDURE:  03/07/2004  DATE OF DISCHARGE:                                 OPERATIVE REPORT   PREOPERATIVE DIAGNOSES:  Postmenopausal bleeding, cervical polyp.   POSTOPERATIVE DIAGNOSES:  Postmenopausal bleeding, cervical polyp. Large  anterior wall endometrial polyp.   PROCEDURE:  Cervical dilatation, hysteroscopy, removal of endometrial polyp,  uterine curettage.   SURGEON:  Miguel Aschoff, M.D.   ANESTHESIA:  General.   COMPLICATIONS:  None.   JUSTIFICATION:  The patient is a 70 year old white female who presented to  the office with postmenopausal bleeding who was noted to have a cervical  polyp. The polyp was removed without difficulty but the patient returned to  the office three weeks later reporting renewed vaginal bleeding at this  time.  Additional polypoid tissue was seen coming from the cervix. She is  therefore being brought to the operating room at this time to undergo  hysteroscopy and D&C to see if there are multiple polyps within the confines  of the uterine cavity. The risks and benefits of the procedure had been  discussed with the patient, informed consent has been obtained.   DESCRIPTION OF PROCEDURE:  The patient was taken to the operating room,  placed in a supine position and general anesthesia was administered without  difficulty. She was then placed in the dorsal lithotomy position, prepped  and draped in the usual sterile fashion.  The bladder was catheterized,  examination was carried out. This revealed normal external genitalia, normal  Bartholin and Skene's gland, normal urethra. The vaginal vault was without  gross lesion. The cervix had what appeared to be another cervical polyp  presenting. The  uterus was noted to be anterior, normal size and shape. The  adnexa revealed no masses. At this point, a speculum was placed in the  vaginal vault, the anterior cervical lip was grasped with a tenaculum and  the endocervical canal was dilated using serial Pratt dilators until a #25  Pratt dilator could be passed.  At this point, the hysteroscope was advanced  under direct visualization, the posterior surface of the uterine cavity was  unremarkable, no lesions were noted.  However, anteriorly a large polypoid  mass was found.  At this point, the hysteroscope was removed, polyp forceps  were introduced and it was possible to remove this very large endometrial  polyp in toto and this was sent for histologic examination.  Following this,  the hysteroscope was reintroduced, the anterior mass was noted to be absent  and at this point the hysteroscope was removed and vigorous curettage was  carried out and additional curettings were sent for histologic study.  The  estimated blood loss from the procedure was approximately 20 mL with no  other abnormalities being noted. The procedure was completed, all  instruments were removed. The patient was reversed  from the anesthetic and  taken to the recovery room in satisfactory condition.   Plans are for the patient to be discharged home.   MEDICATIONS:  Cipro 200 mg twice a day x3 days, Darvocet-N 100, 1 every 4  hours as needed for pain.   FOLLOW UP:  She is to call Wednesday, November 30, for her pathology report.  She is to call for any problems such as fever, pain or heavy bleeding.      AR/MEDQ  D:  03/07/2004  T:  03/07/2004  Job:  [id number]

## 2011-01-14 ENCOUNTER — Telehealth: Payer: Self-pay | Admitting: *Deleted

## 2011-01-14 NOTE — Telephone Encounter (Signed)
Patient injured her thumb Thursday evening driving a golf cart. Patient states that her thumb and hand are purple, swollen and painful. Patient wants to be seen. No appointments available today or tomorrow. Please advise.

## 2011-01-14 NOTE — Telephone Encounter (Signed)
Please let her know that I am sorry about the turnaround time and am still an hour behind so cannot see her today  If it really hurts I would have her go to an urgent care tonight or put in for first availible tomorrow  I could add tomorrow at 4:15 if it is open (unsure if it is )- but she may not want to wait that long -- and would need to make sure Terri would still be here if I don't get to her until after 5  Also I think [facility] in [place] does and after hours clinic-may be a good idea

## 2011-01-14 NOTE — Telephone Encounter (Signed)
Patient notified as instructed by telephone. Pt said she is having fair amt of pain in the thumb and she will go to urgent care across from [facility]. Pt said she will ask them to send record to Dr Milinda Antis and will call back with diagnosis and how she is doing tomorrow.

## 2011-01-17 NOTE — Telephone Encounter (Signed)
Left vm for pt to callback 

## 2011-01-18 NOTE — Telephone Encounter (Signed)
I'm glad she is doing ok -- I would have ended up sending her there anyway if she had a fx Wish her speedy healing and let me know if she needs anything Please send for not -thanks

## 2011-01-18 NOTE — Telephone Encounter (Signed)
Pt called back and said that her thumb is broken.  She went to [facility] and had thumb set, with cast.  She just wanted to let you know.

## 2011-01-18 NOTE — Telephone Encounter (Signed)
Patient notified as instructed by telephone. Pt has already requested Delbert Harness send notes to Dr Milinda Antis but I will ck next week also.-

## 2011-01-23 NOTE — Telephone Encounter (Signed)
Spoke with Bonita Quin at [facility] and pt has not signed record release. Spoke with pt and she was seen yesterday at [facility] and recasted and everything looks good; bone in alignment. Pt has appt with ortho dr in 3 weeks again and will sign record release.

## 2011-02-13 ENCOUNTER — Ambulatory Visit (INDEPENDENT_AMBULATORY_CARE_PROVIDER_SITE_OTHER): Payer: Medicare Other

## 2011-02-13 DIAGNOSIS — Z23 Encounter for immunization: Secondary | ICD-10-CM

## 2011-02-21 ENCOUNTER — Other Ambulatory Visit: Payer: Self-pay

## 2011-02-28 ENCOUNTER — Encounter: Payer: Self-pay | Admitting: Family Medicine

## 2011-03-02 ENCOUNTER — Other Ambulatory Visit: Payer: Self-pay | Admitting: Family Medicine

## 2011-03-04 NOTE — Telephone Encounter (Signed)
Will refill electronically  

## 2011-04-16 HISTORY — PX: COLONOSCOPY: SHX174

## 2011-05-06 ENCOUNTER — Encounter: Payer: Self-pay | Admitting: Family Medicine

## 2011-05-07 ENCOUNTER — Ambulatory Visit (INDEPENDENT_AMBULATORY_CARE_PROVIDER_SITE_OTHER): Payer: Medicare Other | Admitting: Family Medicine

## 2011-05-07 ENCOUNTER — Telehealth: Payer: Self-pay | Admitting: Family Medicine

## 2011-05-07 ENCOUNTER — Encounter: Payer: Self-pay | Admitting: Family Medicine

## 2011-05-07 VITALS — BP 150/92 | HR 66 | Temp 98.9°F | Ht 62.0 in | Wt 130.0 lb

## 2011-05-07 DIAGNOSIS — I10 Essential (primary) hypertension: Secondary | ICD-10-CM

## 2011-05-07 DIAGNOSIS — H6500 Acute serous otitis media, unspecified ear: Secondary | ICD-10-CM

## 2011-05-07 DIAGNOSIS — M949 Disorder of cartilage, unspecified: Secondary | ICD-10-CM

## 2011-05-07 DIAGNOSIS — E7800 Pure hypercholesterolemia, unspecified: Secondary | ICD-10-CM

## 2011-05-07 MED ORDER — FLUTICASONE PROPIONATE 50 MCG/ACT NA SUSP: 2.0000 | Freq: Every day | NASAL | Status: DC

## 2011-05-07 NOTE — Telephone Encounter (Signed)
Message copied by Judy Pimple on Tue May 07, 2011  8:16 PM ------      Message from: Baldomero Lamy      Created: Thu May 02, 2011 10:47 AM      Regarding: Cpx labs Wed  1/23       Please order  future cpx labs for pt's upcomming lab appt.      Thanks      Rodney Booze

## 2011-05-07 NOTE — Progress Notes (Signed)
  Subjective:    Patient ID: Sierra Sparks, female    DOB: 12-Nov-1940, 71 y.o.   MRN: [id number]  HPI CC: sinusitis?  Several weeks ago with flu sxs - aching, fever, cough.  Now feeling R ear congestion, clogged up, muffled.  Now continued cough as well productive of mild mucous.  Never ear pain.  No ringing in ear.  No PNDrainage  Tried alka seltzer night time.  Also tried mucous relief.  No fevers/chills in over 1 wk.  No abd pain, n/v/d, ST, tooth pain.  No h/o smoking, no h/o asthma, COPD.  Grandchildren sick at home.  BP Readings from Last 3 Encounters:  05/07/11 150/92  05/14/10 142/80  02/28/10 120/78    Review of Systems Per HPI    Objective:   Physical Exam  Vitals reviewed. Constitutional: She appears well-developed and well-nourished. No distress.  HENT:  Head: Normocephalic and atraumatic.  Right Ear: Hearing, external ear and ear canal normal.  Left Ear: Hearing, tympanic membrane, external ear and ear canal normal.  Nose: No mucosal edema or rhinorrhea. Right sinus exhibits no maxillary sinus tenderness and no frontal sinus tenderness. Left sinus exhibits no maxillary sinus tenderness and no frontal sinus tenderness.  Mouth/Throat: Uvula is midline, oropharynx is clear and moist and mucous membranes are normal. No oropharyngeal exudate, posterior oropharyngeal edema, posterior oropharyngeal erythema or tonsillar abscesses.       R TM retracted, fluid behind TM, no bulging or erythema  Eyes: Conjunctivae and EOM are normal. Pupils are equal, round, and reactive to light. No scleral icterus.  Neck: Normal range of motion. Neck supple.  Cardiovascular: Normal rate, regular rhythm, normal heart sounds and intact distal pulses.   No murmur heard. Pulmonary/Chest: Effort normal and breath sounds normal. No respiratory distress. She has no wheezes. She has no rales.  Lymphadenopathy:    She has no cervical adenopathy.  Skin: Skin is warm and dry. No rash noted.     Assessment & Plan:

## 2011-05-07 NOTE — Patient Instructions (Addendum)
I think you have residual serous otitis or fluid in the ear leading to pressure change. Should get better with time May use nasal saline and flonase (nasal steroid) for symptomatic relief and hopefully help speed recovery process. Push fluids and rest. Lungs sound clear today. If not improving after another 2 weeks, let me know.

## 2011-05-07 NOTE — Assessment & Plan Note (Signed)
On right side. Supportive care. Reassured should improve with time.  If not, consider referral to ENT. May use flonase and nasal saline. Cough - anticipate post viral process.  Should improve with time.  Declines cough syrup.

## 2011-05-08 ENCOUNTER — Other Ambulatory Visit (INDEPENDENT_AMBULATORY_CARE_PROVIDER_SITE_OTHER): Payer: Medicare Other

## 2011-05-08 DIAGNOSIS — I10 Essential (primary) hypertension: Secondary | ICD-10-CM

## 2011-05-08 DIAGNOSIS — E7800 Pure hypercholesterolemia, unspecified: Secondary | ICD-10-CM

## 2011-05-08 DIAGNOSIS — M949 Disorder of cartilage, unspecified: Secondary | ICD-10-CM

## 2011-05-08 LAB — CBC WITH DIFFERENTIAL/PLATELET
Basophils Absolute: 0 10*3/uL (ref 0.0–0.1)
Eosinophils Absolute: 0 10*3/uL (ref 0.0–0.7)
HCT: 34.4 % — ABNORMAL LOW (ref 36.0–46.0)
Lymphs Abs: 1.5 10*3/uL (ref 0.7–4.0)
MCHC: 34.1 g/dL (ref 30.0–36.0)
MCV: 93.6 fl (ref 78.0–100.0)
Monocytes Absolute: 0.7 10*3/uL (ref 0.1–1.0)
Platelets: 355 10*3/uL (ref 150.0–400.0)
RDW: 13.8 % (ref 11.5–14.6)

## 2011-05-08 LAB — COMPREHENSIVE METABOLIC PANEL
ALT: 15 U/L (ref 0–35)
Alkaline Phosphatase: 82 U/L (ref 39–117)
Glucose, Bld: 89 mg/dL (ref 70–99)
Sodium: 141 mEq/L (ref 135–145)
Total Bilirubin: 0.5 mg/dL (ref 0.3–1.2)
Total Protein: 7.5 g/dL (ref 6.0–8.3)

## 2011-05-08 LAB — LIPID PANEL
Cholesterol: 169 mg/dL (ref 0–200)
LDL Cholesterol: 118 mg/dL — ABNORMAL HIGH (ref 0–99)
VLDL: 13.6 mg/dL (ref 0.0–40.0)

## 2011-05-09 LAB — VITAMIN D 25 HYDROXY (VIT D DEFICIENCY, FRACTURES): Vit D, 25-Hydroxy: 60 ng/mL (ref 30–89)

## 2011-05-15 ENCOUNTER — Encounter: Payer: Self-pay | Admitting: Family Medicine

## 2011-05-15 ENCOUNTER — Ambulatory Visit (INDEPENDENT_AMBULATORY_CARE_PROVIDER_SITE_OTHER): Payer: Medicare Other | Admitting: Family Medicine

## 2011-05-15 ENCOUNTER — Encounter: Payer: Self-pay | Admitting: Internal Medicine

## 2011-05-15 VITALS — BP 140/76 | HR 76 | Temp 98.9°F | Ht 62.0 in | Wt 128.2 lb

## 2011-05-15 DIAGNOSIS — I10 Essential (primary) hypertension: Secondary | ICD-10-CM

## 2011-05-15 DIAGNOSIS — E7800 Pure hypercholesterolemia, unspecified: Secondary | ICD-10-CM

## 2011-05-15 DIAGNOSIS — M949 Disorder of cartilage, unspecified: Secondary | ICD-10-CM

## 2011-05-15 DIAGNOSIS — H6500 Acute serous otitis media, unspecified ear: Secondary | ICD-10-CM

## 2011-05-15 DIAGNOSIS — Z86010 Personal history of colonic polyps: Secondary | ICD-10-CM

## 2011-05-15 DIAGNOSIS — Z1231 Encounter for screening mammogram for malignant neoplasm of breast: Secondary | ICD-10-CM | POA: Insufficient documentation

## 2011-05-15 DIAGNOSIS — D649 Anemia, unspecified: Secondary | ICD-10-CM | POA: Insufficient documentation

## 2011-05-15 DIAGNOSIS — M899 Disorder of bone, unspecified: Secondary | ICD-10-CM

## 2011-05-15 MED ORDER — FLUTICASONE PROPIONATE 50 MCG/ACT NA SUSP: 2.0000 | Freq: Every day | NASAL | Status: DC

## 2011-05-15 NOTE — Patient Instructions (Signed)
We will schedule colonoscopy and mammogram at check out  Keep up great diet  Get back to exercise  Start flonase for your ears , Update if not starting to improve in a week or if worsening

## 2011-05-15 NOTE — Progress Notes (Signed)
Subjective:    Patient ID: Sierra Sparks, female    DOB: [birth year], 71 y.o.   MRN: [id number]  HPI Here for check up of chronic medical conditions and to review health mt list  Also serous OM Is more forgetful lately - forgets proper names of places  Not getting lost , no confusion Family does not notice problems    Not swimming since breaking her hand in fall  Now can finally getting back to it      Wt is down 2 lb with bmi of 23  bp is  140/76   Today-- less exercise  No cp or palpitations or headaches or edema  No side effects to medicines  Having troubles with serous OM - fluid in ears Saw Dr Reece Agar - and is not getting any better  Was in  R ear for 1 mo and now in L  Started using flonase-- did not get a px -- and instead got cromolyn sodium  No fever or pain  Head feels like it is in a barrel    Labs show new anemia 11.8 hb Lab Results  Component Value Date   WBC 6.0 05/08/2011   HGB 11.8* 05/08/2011   HCT 34.4* 05/08/2011   MCV 93.6 05/08/2011   PLT 355.0 05/08/2011   last hb 12.3- Does not eat red meat anymore  Last year was denied giving blood due to mild anemia  colonosc 1/07 with tics-- due for her 5 year f/u   Lipids are well controlled Lab Results  Component Value Date   CHOL 169 05/08/2011   CHOL 197 02/26/2010   CHOL 165 03/07/2008   Lab Results  Component Value Date   HDL 37.10* 05/08/2011   HDL 49.30 02/26/2010   HDL 40.4 03/07/2008   Lab Results  Component Value Date   LDLCALC 118* 05/08/2011   LDLCALC 134* 02/26/2010   LDLCALC 114* 03/07/2008   Lab Results  Component Value Date   TRIG 68.0 05/08/2011   TRIG 71.0 02/26/2010   TRIG 53 03/07/2008   Lab Results  Component Value Date   CHOLHDL 5 05/08/2011   CHOLHDL 4 02/26/2010   CHOLHDL 4.1 CALC 03/07/2008   No results found for this basename: LDLDIRECT   with diet   mammo 11/11 Self exam - no lumps on self exam   Last gyn exam - was one year ago  No symptoms, no abn paps or new  partners   dexa 11/11- osteopenia  Ca and D  -- is taking that  Getting back to exercise   Patient Active Problem List  Diagnoses  . HYPERCHOLESTEROLEMIA  . ANXIETY  . DEPRESSION  . HYPERTENSION  . HEMORRHOIDS  . ALLERGIC RHINITIS  . FIBROCYSTIC BREAST DISEASE  . OSTEOARTHRITIS  . OSTEOPENIA  . VIRAL URI  . Acute serous otitis media  . Other screening mammogram  . History of colon polyps  . Anemia, mild   Past Medical History  Diagnosis Date  . Anxiety state, unspecified   . Depressive disorder, not elsewhere classified   . Unspecified essential hypertension   . Osteoarthrosis, unspecified whether generalized or localized, unspecified site     hands  . Disorder of bone and cartilage, unspecified   . Colon polyps   . Allergic rhinitis, cause unspecified   . Diffuse cystic mastopathy   . Unspecified hemorrhoids without mention of complication   . Pure hypercholesterolemia   . Diverticulosis   . External hemorrhoids    Past Surgical History  Procedure Date  . Cholecystectomy   . Hemorrhoid surgery   . Hand surgery     DIP fusion-left  . Bunionectomy 7/01    left   . Colonoscopy 10/03    polyp; hemorrhoids;diverticulosis  . Dexa 3/02    osteopenia  . Colonoscopy 1/07    diverticulosis  . Dexa 1/05    Stable   History  Substance Use Topics  . Smoking status: Never Smoker   . Smokeless tobacco: Not on file  . Alcohol Use: Yes     Occasional-wine   Family History  Problem Relation Age of Onset  . Arthritis Father   . Lung cancer Father     + smoker  . Stroke Mother   . Coronary artery disease Mother 92  . Stroke Maternal Grandmother     hemorrhagic  . Uterine cancer Maternal Grandmother   . Bone cancer Paternal Uncle   . Colon cancer Brother     ? primary colon  . Liver cancer Brother    No Known Allergies Current Outpatient Prescriptions on File Prior to Visit  Medication Sig Dispense Refill  . Calcium Carbonate-Vit D-Min (CALCIUM 1200)  1200-1000 MG-UNIT CHEW Chew by mouth daily.      . fexofenadine (ALLEGRA) 60 MG tablet Take 60 mg by mouth daily.      Marland Kitchen FLUoxetine (PROZAC) 20 MG capsule TAKE ONE CAPSULE BY MOUTH EVERY DAY  90 capsule  3  . Multiple Vitamin (MULTIVITAMIN) tablet Take 1 tablet by mouth daily.      . naproxen (NAPROSYN) 500 MG tablet Take 500 mg by mouth 2 (two) times daily with a meal.      . Omega-3 Fatty Acids (FISH OIL) 300 MG CAPS Take 1 capsule by mouth daily.         Review of Systems Review of Systems  Constitutional: Negative for fever, appetite change, fatigue and unexpected weight change.  Eyes: Negative for pain and visual disturbance.  ENT pos for ear fullness and diff hearing  Respiratory: Negative for cough and shortness of breath.   Cardiovascular: Negative for cp or palpitations    Gastrointestinal: Negative for nausea, diarrhea and constipation.  Genitourinary: Negative for urgency and frequency.  Skin: Negative for pallor or rash   Neurological: Negative for weakness, light-headedness, numbness and headaches.  Hematological: Negative for adenopathy. Does not bruise/bleed easily.  Psychiatric/Behavioral: Negative for dysphoric mood. The patient is not nervous/anxious.          Objective:   Physical Exam  Constitutional: She appears well-developed and well-nourished. No distress.  HENT:  Head: Normocephalic and atraumatic.  Right Ear: External ear normal.  Left Ear: External ear normal.  Nose: Nose normal.  Mouth/Throat: Oropharynx is clear and moist. No oropharyngeal exudate.       Nares boggy TMs dull No sinus tenderness  Eyes: Conjunctivae and EOM are normal. Pupils are equal, round, and reactive to light. Right eye exhibits no discharge. Left eye exhibits no discharge.  Neck: Normal range of motion. Neck supple. No JVD present. Carotid bruit is not present. No thyromegaly present.  Cardiovascular: Normal rate, regular rhythm, normal heart sounds and intact distal pulses.   Exam reveals no gallop.   Pulmonary/Chest: Effort normal and breath sounds normal. No respiratory distress. She has no wheezes. She exhibits no tenderness.  Abdominal: Soft. Bowel sounds are normal. She exhibits no distension, no abdominal bruit and no mass. There is no tenderness.  Genitourinary: No breast swelling, tenderness, discharge or bleeding.  Breast exam: No mass, nodules, thickening, tenderness, bulging, retraction, inflamation, nipple discharge or skin changes noted.  No axillary or clavicular LA.  Chaperoned exam.    Musculoskeletal: Normal range of motion. She exhibits no edema and no tenderness.  Lymphadenopathy:    She has no cervical adenopathy.  Neurological: She is alert. She has normal reflexes. No cranial nerve deficit. She exhibits normal muscle tone. Coordination normal.  Skin: Skin is warm and dry. No rash noted. No erythema. No pallor.  Psychiatric: She has a normal mood and affect.          Assessment & Plan:

## 2011-06-27 ENCOUNTER — Ambulatory Visit (AMBULATORY_SURGERY_CENTER): Payer: Medicare Other | Admitting: *Deleted

## 2011-06-27 VITALS — Ht 62.0 in | Wt 131.2 lb

## 2011-06-27 DIAGNOSIS — Z1211 Encounter for screening for malignant neoplasm of colon: Secondary | ICD-10-CM

## 2011-06-27 MED ORDER — PEG-KCL-NACL-NASULF-NA ASC-C 100 G PO SOLR: 1.0000 | Freq: Once | ORAL | Status: DC

## 2011-07-03 ENCOUNTER — Ambulatory Visit: Payer: Self-pay | Admitting: Family Medicine

## 2011-07-05 ENCOUNTER — Encounter: Payer: Self-pay | Admitting: Family Medicine

## 2011-07-08 ENCOUNTER — Encounter: Payer: Self-pay | Admitting: *Deleted

## 2011-07-11 ENCOUNTER — Ambulatory Visit (AMBULATORY_SURGERY_CENTER): Payer: Medicare Other | Admitting: Internal Medicine

## 2011-07-11 ENCOUNTER — Telehealth: Payer: Self-pay | Admitting: Internal Medicine

## 2011-07-11 ENCOUNTER — Encounter: Payer: Self-pay | Admitting: Internal Medicine

## 2011-07-11 VITALS — BP 145/89 | HR 60 | Temp 98.3°F | Resp 14 | Ht 62.0 in | Wt 131.0 lb

## 2011-07-11 DIAGNOSIS — Z1211 Encounter for screening for malignant neoplasm of colon: Secondary | ICD-10-CM

## 2011-07-11 DIAGNOSIS — Z86010 Personal history of colonic polyps: Secondary | ICD-10-CM

## 2011-07-11 MED ORDER — SODIUM CHLORIDE 0.9 % IV SOLN: 500.0000 mL | INTRAVENOUS | Status: DC

## 2011-07-11 NOTE — Patient Instructions (Signed)
YOU HAD AN ENDOSCOPIC PROCEDURE TODAY AT THE [facility]: Refer to the procedure report that was given to you for any specific questions about what was found during the examination.  If the procedure report does not answer your questions, please call your gastroenterologist to clarify.  If you requested that your care partner not be given the details of your procedure findings, then the procedure report has been included in a sealed envelope for you to review at your convenience later.  YOU SHOULD EXPECT: Some feelings of bloating in the abdomen. Passage of more gas than usual.  Walking can help get rid of the air that was put into your GI tract during the procedure and reduce the bloating. If you had a lower endoscopy (such as a colonoscopy or flexible sigmoidoscopy) you may notice spotting of blood in your stool or on the toilet paper. If you underwent a bowel prep for your procedure, then you may not have a normal bowel movement for a few days.  DIET: Your first meal following the procedure should be a light meal and then it is ok to progress to your normal diet.  A half-sandwich or bowl of soup is an example of a good first meal.  Heavy or fried foods are harder to digest and may make you feel nauseous or bloated.  Likewise meals heavy in dairy and vegetables can cause extra gas to form and this can also increase the bloating.  Drink plenty of fluids but you should avoid alcoholic beverages for 24 hours.  ACTIVITY: Your care partner should take you home directly after the procedure.  You should plan to take it easy, moving slowly for the rest of the day.  You can resume normal activity the day after the procedure however you should NOT DRIVE or use heavy machinery for 24 hours (because of the sedation medicines used during the test).    SYMPTOMS TO REPORT IMMEDIATELY: A gastroenterologist can be reached at any hour.  During normal business hours, 8:30 AM to 5:00 PM Monday through Friday,  call [phone].  After hours and on weekends, please call the GI answering service at [phone] who will take a message and have the physician on call contact you.   Following lower endoscopy (colonoscopy or flexible sigmoidoscopy):  Excessive amounts of blood in the stool  Significant tenderness or worsening of abdominal pains  Swelling of the abdomen that is new, acute  Fever of 100F or higher  Black, tarry-looking stools  FOLLOW UP: If any biopsies were taken you will be contacted by phone or by letter within the next 1-3 weeks.  Call your gastroenterologist if you have not heard about the biopsies in 3 weeks.  Our staff will call the home number listed on your records the next business day following your procedure to check on you and address any questions or concerns that you may have at that time regarding the information given to you following your procedure. This is a courtesy call and so if there is no answer at the home number and we have not heard from you through the emergency physician on call, we will assume that you have returned to your regular daily activities without incident.  SIGNATURES/CONFIDENTIALITY: You and/or your care partner have signed paperwork which will be entered into your electronic medical record.  These signatures attest to the fact that that the information above on your After Visit Summary has been reviewed and is understood.  Full responsibility of   the confidentiality of this discharge information lies with you and/or your care-partner.    Resume all medications. Information given on diverticulosis and high fiber diet with discharge instructions.

## 2011-07-11 NOTE — Progress Notes (Signed)
Patient did not experience any of the following events: a burn prior to discharge; a fall within the facility; wrong site/side/patient/procedure/implant event; or a hospital transfer or hospital admission upon discharge from the facility. (G8907) Patient did not have preoperative order for IV antibiotic SSI prophylaxis. (G8918)  

## 2011-07-11 NOTE — Op Note (Signed)
[facility] [address] [place], [place]  [address]  COLONOSCOPY PROCEDURE REPORT  PATIENT:  Sierra Sparks, Ju  MR#:  [id number] BIRTHDATE:  1940-10-10, 70 yrs. old  GENDER:  female ENDOSCOPIST:  Wilhemina Bonito. Eda Keys, MD REF. BY:  Surveillance Program Recall, PROCEDURE DATE:  07/11/2011 PROCEDURE:  Surveillance Colonoscopy ASA CLASS:  Class II INDICATIONS:  history of pre-cancerous (adenomatous) colon polyps, surveillance and high-risk screening ; index 2003 (TA); f/u 2007 MEDICATIONS:   MAC sedation, administered by CRNA, propofol (Diprivan) 250 mg IV  DESCRIPTION OF PROCEDURE:   After the risks benefits and alternatives of the procedure were thoroughly explained, informed consent was obtained.  Digital rectal exam was performed and revealed no abnormalities.   The LB 180AL [id number] endoscope was introduced through the anus and advanced to the cecum, which was identified by both the appendix and ileocecal valve, without limitations.  The quality of the prep was excellent, using MoviPrep.  The instrument was then slowly withdrawn as the colon was fully examined. <<PROCEDUREIMAGES>>  FINDINGS:  Scattered diverticula were found.  Otherwise normal colonoscopy without polyps, masses, vascular ectasias, or inflammatory changes.   Retroflexed views in the rectum revealed no abnormalities.    The time to cecum = 5:25  minutes. The scope was then withdrawn in  10:38  minutes from the cecum and the procedure completed.  COMPLICATIONS:  None  ENDOSCOPIC IMPRESSION: 1) Diverticula, scattered 2) Otherwise normal colonoscopy  RECOMMENDATIONS: 1) Follow up colonoscopy in 5 years  ______________________________ Wilhemina Bonito. Eda Keys, MD  CC:  The Patient;  Judy Pimple, MD  n. Rosalie DoctorWilhemina Bonito. Eda Keys at 07/11/2011 08:50 AM  Gaylan Gerold, [phone]

## 2011-07-11 NOTE — Telephone Encounter (Signed)
Error. No encounter 

## 2011-07-15 ENCOUNTER — Telehealth: Payer: Self-pay | Admitting: *Deleted

## 2011-07-15 NOTE — Telephone Encounter (Signed)
  Follow up Call-  Call back number 07/11/2011  Post procedure Call Back phone  # [phone]  Permission to leave phone message Yes     Patient questions:     Left mess. that we called to check on pt. After procedure last week.

## 2012-04-01 ENCOUNTER — Ambulatory Visit (INDEPENDENT_AMBULATORY_CARE_PROVIDER_SITE_OTHER): Payer: Medicare Other | Admitting: Family Medicine

## 2012-04-01 ENCOUNTER — Encounter: Payer: Self-pay | Admitting: Family Medicine

## 2012-04-01 VITALS — BP 146/82 | HR 63 | Temp 97.7°F | Ht 62.0 in | Wt 133.2 lb

## 2012-04-01 DIAGNOSIS — L723 Sebaceous cyst: Secondary | ICD-10-CM

## 2012-04-01 NOTE — Patient Instructions (Addendum)
We will do dermatology referral at check out  Keep area clean and cover if necessary

## 2012-04-01 NOTE — Assessment & Plan Note (Signed)
Did try to express material from it today- not successful Pt wants this removed in total No signs of infection Ref to dermatology for removal

## 2012-04-01 NOTE — Progress Notes (Signed)
Subjective:    Patient ID: Sierra Sparks, female    DOB: [birth year], 71 y.o.   MRN: [id number]  HPI Here for a lump on the back of her neck Thinks it is a cyst  Wants it gone No pain  Has been there for about 2 months- is getting bigger   Has not been sick or had infx or fever   Patient Active Problem List  Diagnosis  . HYPERCHOLESTEROLEMIA  . ANXIETY  . DEPRESSION  . HYPERTENSION  . HEMORRHOIDS  . ALLERGIC RHINITIS  . FIBROCYSTIC BREAST DISEASE  . OSTEOARTHRITIS  . OSTEOPENIA  . VIRAL URI  . Acute serous otitis media  . Other screening mammogram  . History of colon polyps  . Anemia, mild   Past Medical History  Diagnosis Date  . Anxiety state, unspecified   . Depressive disorder, not elsewhere classified   . Unspecified essential hypertension   . Osteoarthrosis, unspecified whether generalized or localized, unspecified site     hands  . Disorder of bone and cartilage, unspecified   . Colon polyps   . Allergic rhinitis, cause unspecified   . Diffuse cystic mastopathy   . Unspecified hemorrhoids without mention of complication   . Pure hypercholesterolemia   . Diverticulosis   . External hemorrhoids   . Anemia   . Osteoporosis    Past Surgical History  Procedure Date  . Cholecystectomy   . Hemorrhoid surgery   . Hand surgery     DIP fusion-left  . Bunionectomy 7/01    left   . Colonoscopy 10/03    polyp; hemorrhoids;diverticulosis  . Dexa 3/02    osteopenia  . Colonoscopy 1/07    diverticulosis  . Dexa 1/05    Stable  . Cesarean section 1981  . Tonsillectomy     childhood   History  Substance Use Topics  . Smoking status: Never Smoker   . Smokeless tobacco: Never Used  . Alcohol Use: 0.0 oz/week    0.5 drink(s) per week     Comment: Occasional-wine   Family History  Problem Relation Age of Onset  . Arthritis Father   . Lung cancer Father     + smoker  . Stroke Mother   . Coronary artery disease Mother 61  . Stroke Maternal  Grandmother     hemorrhagic  . Uterine cancer Maternal Grandmother   . Bone cancer Paternal Uncle   . Colon cancer Brother     ? primary colon  . Liver cancer Brother   . Esophageal cancer Neg Hx   . Rectal cancer Neg Hx   . Stomach cancer Neg Hx    No Known Allergies Current Outpatient Prescriptions on File Prior to Visit  Medication Sig Dispense Refill  . Calcium Carbonate-Vit D-Min (CALCIUM 1200) 1200-1000 MG-UNIT CHEW Chew by mouth daily.      . fexofenadine (ALLEGRA) 60 MG tablet Take 60 mg by mouth daily.      Marland Kitchen FLUoxetine (PROZAC) 20 MG capsule TAKE ONE CAPSULE BY MOUTH EVERY DAY  90 capsule  3  . Multiple Vitamin (MULTIVITAMIN) tablet Take 1 tablet by mouth daily.      . naproxen (NAPROSYN) 500 MG tablet Take 500 mg by mouth 2 (two) times daily with a meal.          Review of Systems     Objective:   Physical Exam  Constitutional: She appears well-developed and well-nourished. No distress.  HENT:  Head: Atraumatic.  Neck: Normal range of motion.  Neck supple.  Lymphadenopathy:    She has no cervical adenopathy.  Skin: Skin is warm and dry. No rash noted. No erythema. No pallor.       .5- 1 cm firm round superficial knot on back of neck- no redness or warmth or tenderness Attempt to express any material was unsuccessful  Psychiatric: She has a normal mood and affect.          Assessment & Plan:

## 2012-04-14 ENCOUNTER — Ambulatory Visit (INDEPENDENT_AMBULATORY_CARE_PROVIDER_SITE_OTHER): Payer: Medicare Other

## 2012-04-14 DIAGNOSIS — Z23 Encounter for immunization: Secondary | ICD-10-CM

## 2012-06-20 ENCOUNTER — Other Ambulatory Visit: Payer: Self-pay | Admitting: Family Medicine

## 2012-06-22 NOTE — Telephone Encounter (Signed)
She can have 12 mo of refils, thanks

## 2012-06-22 NOTE — Telephone Encounter (Signed)
Ok to refill 

## 2012-08-04 ENCOUNTER — Telehealth: Payer: Self-pay | Admitting: Family Medicine

## 2012-08-04 DIAGNOSIS — M949 Disorder of cartilage, unspecified: Secondary | ICD-10-CM

## 2012-08-04 DIAGNOSIS — E7800 Pure hypercholesterolemia, unspecified: Secondary | ICD-10-CM

## 2012-08-04 DIAGNOSIS — I10 Essential (primary) hypertension: Secondary | ICD-10-CM

## 2012-08-04 DIAGNOSIS — D649 Anemia, unspecified: Secondary | ICD-10-CM

## 2012-08-04 NOTE — Telephone Encounter (Signed)
Message copied by Judy Pimple on Tue Aug 04, 2012  5:52 PM ------      Message from: Alvina Chou      Created: Tue Jul 28, 2012 11:56 AM      Regarding: lab orders for Wednesday, 4.23.14       Patient is scheduled for CPX labs, please order future labs, Thanks , Terri       ------

## 2012-08-05 ENCOUNTER — Other Ambulatory Visit (INDEPENDENT_AMBULATORY_CARE_PROVIDER_SITE_OTHER): Payer: Medicare PPO

## 2012-08-05 DIAGNOSIS — E7800 Pure hypercholesterolemia, unspecified: Secondary | ICD-10-CM

## 2012-08-05 DIAGNOSIS — I10 Essential (primary) hypertension: Secondary | ICD-10-CM

## 2012-08-05 DIAGNOSIS — D649 Anemia, unspecified: Secondary | ICD-10-CM

## 2012-08-05 LAB — CBC WITH DIFFERENTIAL/PLATELET
Basophils Relative: 0.6 % (ref 0.0–3.0)
Eosinophils Relative: 2.9 % (ref 0.0–5.0)
HCT: 38.7 % (ref 36.0–46.0)
MCV: 92.6 fl (ref 78.0–100.0)
Monocytes Absolute: 0.4 10*3/uL (ref 0.1–1.0)
Monocytes Relative: 10.8 % (ref 3.0–12.0)
Neutrophils Relative %: 43.6 % (ref 43.0–77.0)
Platelets: 217 10*3/uL (ref 150.0–400.0)
RBC: 4.17 Mil/uL (ref 3.87–5.11)
WBC: 4 10*3/uL — ABNORMAL LOW (ref 4.5–10.5)

## 2012-08-05 LAB — COMPREHENSIVE METABOLIC PANEL
Albumin: 3.9 g/dL (ref 3.5–5.2)
BUN: 18 mg/dL (ref 6–23)
CO2: 29 mEq/L (ref 19–32)
Calcium: 9.6 mg/dL (ref 8.4–10.5)
GFR: 81.28 mL/min (ref >60.00)
Glucose, Bld: 90 mg/dL (ref 70–99)
Potassium: 4.1 mEq/L (ref 3.5–5.1)
Sodium: 139 mEq/L (ref 135–145)
Total Protein: 7 g/dL (ref 6.0–8.3)

## 2012-08-05 LAB — LIPID PANEL
Cholesterol: 201 mg/dL — ABNORMAL HIGH (ref 0–200)
Total CHOL/HDL Ratio: 5
Triglycerides: 117 mg/dL (ref 0.0–149.0)

## 2012-08-12 ENCOUNTER — Ambulatory Visit (INDEPENDENT_AMBULATORY_CARE_PROVIDER_SITE_OTHER): Payer: Medicare PPO | Admitting: Family Medicine

## 2012-08-12 ENCOUNTER — Encounter: Payer: Self-pay | Admitting: Family Medicine

## 2012-08-12 VITALS — BP 128/90 | HR 60 | Temp 98.4°F | Ht 61.75 in | Wt 131.2 lb

## 2012-08-12 DIAGNOSIS — J309 Allergic rhinitis, unspecified: Secondary | ICD-10-CM

## 2012-08-12 DIAGNOSIS — I10 Essential (primary) hypertension: Secondary | ICD-10-CM

## 2012-08-12 DIAGNOSIS — E7800 Pure hypercholesterolemia, unspecified: Secondary | ICD-10-CM

## 2012-08-12 DIAGNOSIS — M899 Disorder of bone, unspecified: Secondary | ICD-10-CM

## 2012-08-12 DIAGNOSIS — M949 Disorder of cartilage, unspecified: Secondary | ICD-10-CM

## 2012-08-12 DIAGNOSIS — Z0000 Encounter for general adult medical examination without abnormal findings: Secondary | ICD-10-CM

## 2012-08-12 DIAGNOSIS — H409 Unspecified glaucoma: Secondary | ICD-10-CM | POA: Insufficient documentation

## 2012-08-12 MED ORDER — AZELASTINE HCL 0.15 % NA SOLN: 2.0000 | Freq: Two times a day (BID) | NASAL | Status: DC

## 2012-08-12 NOTE — Progress Notes (Signed)
Subjective:    Patient ID: Sierra Sparks, female    DOB: Aug 18, 1940, 72 y.o.   MRN: [id number]  HPI I have personally reviewed the Medicare Annual Wellness questionnaire and have noted 1. The patient's medical and social history 2. Their use of alcohol, tobacco or illicit drugs 3. Their current medications and supplements 4. The patient's functional ability including ADL's, fall risks, home safety risks and hearing or visual             impairment. 5. Diet and physical activities 6. Evidence for depression or mood disorders  The patients weight, height, BMI have been recorded in the chart and visual acuity is per eye clinic.  I have made referrals, counseling and provided education to the patient based review of the above and I have provided the pt with a written personalized care plan for preventive services.  Is feeling great overall  [facility] - and had laser procedure - it went very well  Vision is ok   She has tickle in her throat- she cannot get rid of  Thinks it is allergies / post nasal drip related  She does clear her throat often No wheeze No heartburn On generic zyrtec A lot of sneezing  Not really congested   Brother died of throat cancer   See scanned forms.  Routine anticipatory guidance given to patient.  See health maintenance. Flu 12/13 vaccine Shingles 2/12 vaccine PNA 11/11 Tetanus 8/07 Colonoscopy 3/13 5 year recall due to family history Breast cancer screening 3/13 so is due - gets her mammograms  Self exam- no lumps or changes  Falls-none (in good shape and she exercises) Mood is fine , on prozac , stays very motivated and stays upbeat (prozac works so well for her ! Long term med) and no side effects  Pap was 11/09 -normal, and no abn pap smears and no gyn symptoms , no new partners  Advance directive - has a POA and living will  Cognitive function addressed- see scanned forms- and if abnormal then additional  documentation follows.  No problems whatsoever   Takes prozac- does great with that   PMH and SH reviewed  Meds, vitals, and allergies reviewed.   ROS: See HPI.  Otherwise negative.     bp is stable today  No cp or palpitations or headaches or edema  No side effects to medicines  BP Readings from Last 3 Encounters:  08/12/12 128/90  04/01/12 146/82  07/11/11 145/89      Hyperlipidemia Lab Results  Component Value Date   CHOL 201* 08/05/2012   CHOL 169 05/08/2011   CHOL 197 02/26/2010   Lab Results  Component Value Date   HDL 39.40 08/05/2012   HDL 01.02* 05/08/2011   HDL 49.30 02/26/2010   Lab Results  Component Value Date   LDLCALC 118* 05/08/2011   LDLCALC 134* 02/26/2010   LDLCALC 114* 03/07/2008   Lab Results  Component Value Date   TRIG 117.0 08/05/2012   TRIG 68.0 05/08/2011   TRIG 71.0 02/26/2010   Lab Results  Component Value Date   CHOLHDL 5 08/05/2012   CHOLHDL 5 05/08/2011   CHOLHDL 4 02/26/2010   Lab Results  Component Value Date   LDLDIRECT 143.2 08/05/2012   diet has not changed - very little fat/ she is very careful  Some shellfish - has been to the beach   Hx of anemia On iron- and hb is normal  Lab Results  Component Value Date   WBC 4.0* 08/05/2012   HGB 13.2 08/05/2012   HCT 38.7 08/05/2012   MCV 92.6 08/05/2012   PLT 217.0 08/05/2012    Patient Active Problem List   Diagnosis Date Noted  . Encounter for Medicare annual wellness exam 08/12/2012  . Glaucoma 08/12/2012  . Sebaceous cyst 04/01/2012  . Other screening mammogram 05/15/2011  . History of colon polyps 05/15/2011  . Anemia, mild 05/15/2011  . ALLERGIC RHINITIS 03/02/2008  . OSTEOPENIA 01/05/2007  . HYPERCHOLESTEROLEMIA 10/24/2006  . ANXIETY 10/24/2006  . DEPRESSION 10/24/2006  . HYPERTENSION 10/24/2006  . HEMORRHOIDS 10/24/2006  . FIBROCYSTIC BREAST DISEASE 10/24/2006  . OSTEOARTHRITIS 10/24/2006   Past Medical History  Diagnosis Date  . Anxiety state, unspecified    . Depressive disorder, not elsewhere classified   . Unspecified essential hypertension   . Osteoarthrosis, unspecified whether generalized or localized, unspecified site     hands  . Disorder of bone and cartilage, unspecified   . Colon polyps   . Allergic rhinitis, cause unspecified   . Diffuse cystic mastopathy   . Unspecified hemorrhoids without mention of complication   . Pure hypercholesterolemia   . Diverticulosis   . External hemorrhoids   . Anemia   . Osteoporosis    Past Surgical History  Procedure Laterality Date  . Cholecystectomy    . Hemorrhoid surgery    . Hand surgery      DIP fusion-left  . Bunionectomy  7/01    left   . Colonoscopy  10/03    polyp; hemorrhoids;diverticulosis  . Dexa  3/02    osteopenia  . Colonoscopy  1/07    diverticulosis  . Dexa  1/05    Stable  . Cesarean section  1981  . Tonsillectomy      childhood   History  Substance Use Topics  . Smoking status: Never Smoker   . Smokeless tobacco: Never Used  . Alcohol Use: 0.0 oz/week    0.5 drink(s) per week     Comment: Occasional-wine   Family History  Problem Relation Age of Onset  . Arthritis Father   . Lung cancer Father     + smoker  . Stroke Mother   . Coronary artery disease Mother 45  . Stroke Maternal Grandmother     hemorrhagic  . Uterine cancer Maternal Grandmother   . Bone cancer Paternal Uncle   . Colon cancer Brother     ? primary colon  . Liver cancer Brother   . Esophageal cancer Neg Hx   . Rectal cancer Neg Hx   . Stomach cancer Neg Hx    No Known Allergies Current Outpatient Prescriptions on File Prior to Visit  Medication Sig Dispense Refill  . Calcium Carbonate-Vit D-Min (CALCIUM 1200) 1200-1000 MG-UNIT CHEW Chew 1 tablet by mouth 2 (two) times daily.       Marland Kitchen FLUoxetine (PROZAC) 20 MG capsule TAKE ONE CAPSULE BY MOUTH EVERY DAY  90 capsule  3  . IRON PO Take 1 tablet by mouth daily.      . Multiple Vitamin (MULTIVITAMIN) tablet Take 1 tablet by mouth  daily.      . naproxen (NAPROSYN) 500 MG tablet Take 500 mg by mouth 2 (two) times daily with a meal.       No current facility-administered medications on file prior to visit.     Review of Systems Review of Systems  Constitutional: Negative for fever, appetite change, fatigue and unexpected weight change.  Eyes:  Negative for pain and visual disturbance.  ENT pos for tickle in throat/ cough/ neg for ear or sinus pain  Respiratory: Negative for cough and shortness of breath.   Cardiovascular: Negative for cp or palpitations    Gastrointestinal: Negative for nausea, diarrhea and constipation.  Genitourinary: Negative for urgency and frequency.  Skin: Negative for pallor or rash   Neurological: Negative for weakness, light-headedness, numbness and headaches.  Hematological: Negative for adenopathy. Does not bruise/bleed easily.  Psychiatric/Behavioral: Negative for dysphoric mood. The patient is not nervous/anxious.         Objective:   Physical Exam  Constitutional: She appears well-developed and well-nourished. No distress.  HENT:  Head: Normocephalic and atraumatic.  Right Ear: External ear normal.  Left Ear: External ear normal.  Nose: Nose normal.  Mouth/Throat: Oropharynx is clear and moist.  Nares are boggy Throat is clear   Eyes: Conjunctivae and EOM are normal. Pupils are equal, round, and reactive to light. Right eye exhibits no discharge. Left eye exhibits no discharge. No scleral icterus.  Neck: Normal range of motion. Neck supple. No JVD present. Carotid bruit is not present. No thyromegaly present.  Cardiovascular: Normal rate, regular rhythm, normal heart sounds and intact distal pulses.  Exam reveals no gallop.   Pulmonary/Chest: Effort normal and breath sounds normal. No respiratory distress. She has no wheezes.  No crackles  Abdominal: Soft. Bowel sounds are normal. She exhibits no distension, no abdominal bruit and no mass. There is no tenderness.   Genitourinary: No breast swelling, tenderness, discharge or bleeding.  Breast exam: No mass, nodules, thickening, tenderness, bulging, retraction, inflamation, nipple discharge or skin changes noted.  No axillary or clavicular LA.  Chaperoned exam.    Musculoskeletal: She exhibits no edema and no tenderness.  Lymphadenopathy:    She has no cervical adenopathy.  Neurological: She is alert. She has normal reflexes. No cranial nerve deficit. She exhibits normal muscle tone. Coordination normal.  Skin: Skin is warm and dry. No rash noted. No erythema. No pallor.  Psychiatric: She has a normal mood and affect.          Assessment & Plan:

## 2012-08-12 NOTE — Patient Instructions (Addendum)
Don't forget to make your mammogram appt at [facility]  Avoid red meat/ fried foods/ egg yolks/ fatty breakfast meats/ butter, cheese and high fat dairy/ and shellfish  -esp shellfish We will continue to watch cholesterol level  Try astepro nasal spray and follow up if no improvement  Continue the zyrtec

## 2012-08-13 NOTE — Assessment & Plan Note (Signed)
Suspect this is causing throat irritation/ drip/cough Trial of astapro ns and update

## 2012-08-13 NOTE — Assessment & Plan Note (Signed)
Rev last dexa - disc imp of ca and D No falls or fractures Good exercise

## 2012-08-13 NOTE — Assessment & Plan Note (Signed)
bp in fair control at this time  No changes needed  Disc lifstyle change with low sodium diet and exercise  Labs reviewed  

## 2012-08-13 NOTE — Assessment & Plan Note (Signed)
Lipids are up a bit - despite good diet Disc goals for lipids and reasons to control them Rev labs with pt Rev low sat fat diet in detail Will follow closely

## 2012-08-13 NOTE — Assessment & Plan Note (Signed)
Reviewed health habits including diet and exercise and skin cancer prevention Also reviewed health mt list, fam hx and immunizations  See HPI Very good self care

## 2013-03-17 ENCOUNTER — Ambulatory Visit (INDEPENDENT_AMBULATORY_CARE_PROVIDER_SITE_OTHER): Payer: Medicare PPO

## 2013-03-17 DIAGNOSIS — Z23 Encounter for immunization: Secondary | ICD-10-CM

## 2013-06-09 ENCOUNTER — Encounter: Payer: Self-pay | Admitting: Family Medicine

## 2013-06-09 ENCOUNTER — Ambulatory Visit (INDEPENDENT_AMBULATORY_CARE_PROVIDER_SITE_OTHER): Payer: Medicare PPO | Admitting: Family Medicine

## 2013-06-09 VITALS — BP 136/82 | HR 78 | Temp 98.5°F | Ht 61.75 in | Wt 138.2 lb

## 2013-06-09 DIAGNOSIS — B9689 Other specified bacterial agents as the cause of diseases classified elsewhere: Secondary | ICD-10-CM

## 2013-06-09 DIAGNOSIS — J019 Acute sinusitis, unspecified: Secondary | ICD-10-CM

## 2013-06-09 MED ORDER — AMOXICILLIN-POT CLAVULANATE 875-125 MG PO TABS: 1.0000 | ORAL_TABLET | Freq: Two times a day (BID) | ORAL | Status: DC

## 2013-06-09 NOTE — Progress Notes (Signed)
Pre visit review using our clinic review tool, if applicable. No additional management support is needed unless otherwise documented below in the visit note. 

## 2013-06-09 NOTE — Progress Notes (Signed)
Subjective:    Patient ID: Sierra Sparks, female    DOB: 23-Apr-1940, 73 y.o.   MRN: [id number]  HPI Here with symptoms of a sinus infection  Started as uri - 2 weeks ago Getting worse over the past week - facial pressure and thick yellow drainage Very congested   No fever  Some cough - a little congested   Patient Active Problem List   Diagnosis Date Noted  . Encounter for Medicare annual wellness exam 08/12/2012  . Glaucoma 08/12/2012  . Sebaceous cyst 04/01/2012  . Other screening mammogram 05/15/2011  . History of colon polyps 05/15/2011  . Anemia, mild 05/15/2011  . ALLERGIC RHINITIS 03/02/2008  . OSTEOPENIA 01/05/2007  . HYPERCHOLESTEROLEMIA 10/24/2006  . ANXIETY 10/24/2006  . DEPRESSION 10/24/2006  . HYPERTENSION 10/24/2006  . HEMORRHOIDS 10/24/2006  . FIBROCYSTIC BREAST DISEASE 10/24/2006  . OSTEOARTHRITIS 10/24/2006   Past Medical History  Diagnosis Date  . Anxiety state, unspecified   . Depressive disorder, not elsewhere classified   . Unspecified essential hypertension   . Osteoarthrosis, unspecified whether generalized or localized, unspecified site     hands  . Disorder of bone and cartilage, unspecified   . Colon polyps   . Allergic rhinitis, cause unspecified   . Diffuse cystic mastopathy   . Unspecified hemorrhoids without mention of complication   . Pure hypercholesterolemia   . Diverticulosis   . External hemorrhoids   . Anemia   . Osteoporosis    Past Surgical History  Procedure Laterality Date  . Cholecystectomy    . Hemorrhoid surgery    . Hand surgery      DIP fusion-left  . Bunionectomy  7/01    left   . Colonoscopy  10/03    polyp; hemorrhoids;diverticulosis  . Dexa  3/02    osteopenia  . Colonoscopy  1/07    diverticulosis  . Dexa  1/05    Stable  . Cesarean section  1981  . Tonsillectomy      childhood   History  Substance Use Topics  . Smoking status: Never Smoker   . Smokeless tobacco: Never Used  . Alcohol Use:  0.0 oz/week    0.5 drink(s) per week     Comment: Occasional-wine   Family History  Problem Relation Age of Onset  . Arthritis Father   . Lung cancer Father     + smoker  . Stroke Mother   . Coronary artery disease Mother 50  . Stroke Maternal Grandmother     hemorrhagic  . Uterine cancer Maternal Grandmother   . Bone cancer Paternal Uncle   . Colon cancer Brother     ? primary colon  . Liver cancer Brother   . Esophageal cancer Neg Hx   . Rectal cancer Neg Hx   . Stomach cancer Neg Hx    No Known Allergies Current Outpatient Prescriptions on File Prior to Visit  Medication Sig Dispense Refill  . Azelastine HCl 0.15 % SOLN Place 2 sprays into the nose 2 (two) times daily.  30 mL  11  . Calcium Carbonate-Vit D-Min (CALCIUM 1200) 1200-1000 MG-UNIT CHEW Chew 1 tablet by mouth 2 (two) times daily.       . Cetirizine HCl 10 MG CAPS Take 1 capsule by mouth daily.      . cyanocobalamin 500 MCG tablet Take 500 mcg by mouth daily.      Marland Kitchen FLUoxetine (PROZAC) 20 MG capsule TAKE ONE CAPSULE BY MOUTH EVERY DAY  90 capsule  3  . IRON PO Take 1 tablet by mouth daily.      . Multiple Vitamin (MULTIVITAMIN) tablet Take 1 tablet by mouth daily.      . naproxen (NAPROSYN) 500 MG tablet Take 500 mg by mouth 2 (two) times daily with a meal.       No current facility-administered medications on file prior to visit.      Review of Systems Review of Systems  Constitutional: Negative for fever, appetite change,  and unexpected weight change.  ENt pos for severe nasal congestion and facial pressure / neg for ST Eyes: Negative for pain and visual disturbance.  Respiratory: Negative for wheeze and shortness of breath.   Cardiovascular: Negative for cp or palpitations    Gastrointestinal: Negative for nausea, diarrhea and constipation.  Genitourinary: Negative for urgency and frequency.  Skin: Negative for pallor or rash   Neurological: Negative for weakness, light-headedness, numbness and  headaches.  Hematological: Negative for adenopathy. Does not bruise/bleed easily.  Psychiatric/Behavioral: Negative for dysphoric mood. The patient is not nervous/anxious.         Objective:   Physical Exam        Assessment & Plan:

## 2013-06-09 NOTE — Patient Instructions (Signed)
Drink lots of fluids Take the augmentin as directed Nasal saline as often as you need it  mucinex otc -with lots of water will help congestion  Ibuprofen with food helps also  Last resort- afrin nasal spray- 2 days only Update if not starting to improve in a week or if worsening

## 2013-06-10 NOTE — Assessment & Plan Note (Signed)
Cover with augmentin  Disc symptomatic care - see instructions on AVS  Update if not starting to improve in a week or if worsening   

## 2013-08-27 ENCOUNTER — Other Ambulatory Visit: Payer: Self-pay | Admitting: *Deleted

## 2013-08-27 MED ORDER — FLUOXETINE HCL 20 MG PO CAPS: ORAL_CAPSULE | ORAL | Status: DC

## 2013-08-27 NOTE — Telephone Encounter (Signed)
Fax refill request, please advise  

## 2013-08-27 NOTE — Telephone Encounter (Signed)
Will refill electronically  

## 2013-09-01 ENCOUNTER — Other Ambulatory Visit (INDEPENDENT_AMBULATORY_CARE_PROVIDER_SITE_OTHER): Payer: Medicare PPO

## 2013-09-01 ENCOUNTER — Telehealth: Payer: Self-pay | Admitting: Family Medicine

## 2013-09-01 DIAGNOSIS — M949 Disorder of cartilage, unspecified: Secondary | ICD-10-CM

## 2013-09-01 DIAGNOSIS — I10 Essential (primary) hypertension: Secondary | ICD-10-CM

## 2013-09-01 DIAGNOSIS — E7800 Pure hypercholesterolemia, unspecified: Secondary | ICD-10-CM

## 2013-09-01 DIAGNOSIS — M899 Disorder of bone, unspecified: Secondary | ICD-10-CM

## 2013-09-01 DIAGNOSIS — D649 Anemia, unspecified: Secondary | ICD-10-CM

## 2013-09-01 LAB — CBC WITH DIFFERENTIAL/PLATELET
BASOS ABS: 0 10*3/uL (ref 0.0–0.1)
Basophils Relative: 0.4 % (ref 0.0–3.0)
EOS ABS: 0.1 10*3/uL (ref 0.0–0.7)
Eosinophils Relative: 1.4 % (ref 0.0–5.0)
HEMATOCRIT: 37.5 % (ref 36.0–46.0)
Hemoglobin: 12.7 g/dL (ref 12.0–15.0)
LYMPHS ABS: 1.1 10*3/uL (ref 0.7–4.0)
Lymphocytes Relative: 24.3 % (ref 12.0–46.0)
MCHC: 34 g/dL (ref 30.0–36.0)
MCV: 94.1 fl (ref 78.0–100.0)
MONO ABS: 0.6 10*3/uL (ref 0.1–1.0)
MONOS PCT: 13.7 % — AB (ref 3.0–12.0)
Neutro Abs: 2.7 10*3/uL (ref 1.4–7.7)
Neutrophils Relative %: 60.2 % (ref 43.0–77.0)
PLATELETS: 219 10*3/uL (ref 150.0–400.0)
RBC: 3.98 Mil/uL (ref 3.87–5.11)
RDW: 14.3 % (ref 11.5–15.5)
WBC: 4.5 10*3/uL (ref 4.0–10.5)

## 2013-09-01 LAB — COMPREHENSIVE METABOLIC PANEL
ALK PHOS: 72 U/L (ref 39–117)
ALT: 16 U/L (ref 0–35)
AST: 20 U/L (ref 0–37)
Albumin: 4.1 g/dL (ref 3.5–5.2)
BILIRUBIN TOTAL: 0.6 mg/dL (ref 0.2–1.2)
BUN: 18 mg/dL (ref 6–23)
CO2: 29 meq/L (ref 19–32)
Calcium: 9.6 mg/dL (ref 8.4–10.5)
Chloride: 103 mEq/L (ref 96–112)
Creatinine, Ser: 0.7 mg/dL (ref 0.4–1.2)
GFR: 92.29 mL/min (ref >60.00)
Glucose, Bld: 84 mg/dL (ref 70–99)
Potassium: 4 mEq/L (ref 3.5–5.1)
Sodium: 139 mEq/L (ref 135–145)
Total Protein: 6.8 g/dL (ref 6.0–8.3)

## 2013-09-01 LAB — LIPID PANEL
Cholesterol: 201 mg/dL — ABNORMAL HIGH (ref 0–200)
HDL: 54.4 mg/dL (ref >39.00)
LDL CALC: 132 mg/dL — AB (ref 0–99)
Total CHOL/HDL Ratio: 4
Triglycerides: 75 mg/dL (ref 0.0–149.0)
VLDL: 15 mg/dL (ref 0.0–40.0)

## 2013-09-01 LAB — TSH: TSH: 1.25 u[IU]/mL (ref 0.35–4.50)

## 2013-09-01 NOTE — Telephone Encounter (Signed)
Message copied by Abner Greenspan on Wed Sep 01, 2013  7:23 AM ------      Message from: Ellamae Sia      Created: Wed Aug 25, 2013  4:49 PM      Regarding: Lab orders for Wednesday, 5.20.15       Patient is scheduled for CPX labs, please order future labs, Thanks , Sierra Sparks       ------

## 2013-09-02 LAB — VITAMIN D 25 HYDROXY (VIT D DEFICIENCY, FRACTURES): VIT D 25 HYDROXY: 53 ng/mL (ref 30–89)

## 2013-09-08 ENCOUNTER — Ambulatory Visit (INDEPENDENT_AMBULATORY_CARE_PROVIDER_SITE_OTHER): Payer: Medicare PPO | Admitting: Family Medicine

## 2013-09-08 ENCOUNTER — Encounter: Payer: Self-pay | Admitting: Family Medicine

## 2013-09-08 VITALS — BP 130/78 | HR 63 | Temp 98.7°F | Ht 61.75 in | Wt 133.5 lb

## 2013-09-08 DIAGNOSIS — Z0000 Encounter for general adult medical examination without abnormal findings: Secondary | ICD-10-CM

## 2013-09-08 DIAGNOSIS — Z23 Encounter for immunization: Secondary | ICD-10-CM

## 2013-09-08 DIAGNOSIS — E7800 Pure hypercholesterolemia, unspecified: Secondary | ICD-10-CM

## 2013-09-08 DIAGNOSIS — I10 Essential (primary) hypertension: Secondary | ICD-10-CM

## 2013-09-08 DIAGNOSIS — M949 Disorder of cartilage, unspecified: Secondary | ICD-10-CM

## 2013-09-08 DIAGNOSIS — M899 Disorder of bone, unspecified: Secondary | ICD-10-CM

## 2013-09-08 NOTE — Progress Notes (Signed)
Subjective:    Patient ID: Sierra Sparks, female    DOB: 1940/05/23, 73 y.o.   MRN: [id number]  HPI I have personally reviewed the Medicare Annual Wellness questionnaire and have noted 1. The patient's medical and social history 2. Their use of alcohol, tobacco or illicit drugs 3. Their current medications and supplements 4. The patient's functional ability including ADL's, fall risks, home safety risks and hearing or visual             impairment. 5. Diet and physical activities 6. Evidence for depression or mood disorders  The patients weight, height, BMI have been recorded in the chart and visual acuity is per eye clinic.  I have made referrals, counseling and provided education to the patient based review of the above and I have provided the pt with a written personalized care plan for preventive services.  Her son and grandchild live them now -in middle of a divorce  She is a bit tired  Otherwise is doing ok and taking very good care of herself   Legs and hips hurt a bit more this summer -not bad however    See scanned forms.  Routine anticipatory guidance given to patient.  See health maintenance. Colon cancer screening 3/13 - 5 year recall Breast cancer screening 3/13 - missed 2014 - she will schedule that  Self breast exam-no lumps or changes  Flu vaccine 12/14 Tetanus vaccine 8/07 Pneumovax 11/11   Zoster vaccine 2/12   Advance directive- has a living will  Cognitive function addressed- see scanned forms- and if abnormal then additional documentation follows. Fine/ no major problems   PMH and SH reviewed  Meds, vitals, and allergies reviewed.   ROS: See HPI.  Otherwise negative.    bp is stable today  No cp or palpitations or headaches or edema  No side effects to medicines  BP Readings from Last 3 Encounters:  09/08/13 130/78  06/09/13 136/82  08/12/12 128/90      Osteopenia dexa11/11 Would like to schedule one  Ca and D fx hx -no fractures    Hyperlipidemia Lab Results  Component Value Date   CHOL 201* 09/01/2013   CHOL 201* 08/05/2012   CHOL 169 05/08/2011   Lab Results  Component Value Date   HDL 54.40 09/01/2013   HDL 39.40 08/05/2012   HDL 37.10* 05/08/2011   Lab Results  Component Value Date   LDLCALC 132* 09/01/2013   LDLCALC 118* 05/08/2011   LDLCALC 134* 02/26/2010   Lab Results  Component Value Date   TRIG 75.0 09/01/2013   TRIG 117.0 08/05/2012   TRIG 68.0 05/08/2011   Lab Results  Component Value Date   CHOLHDL 4 09/01/2013   CHOLHDL 5 08/05/2012   CHOLHDL 5 05/08/2011   Lab Results  Component Value Date   LDLDIRECT 143.2 08/05/2012   improved -better HDL    Patient Active Problem List   Diagnosis Date Noted  . Encounter for Medicare annual wellness exam 08/12/2012  . Glaucoma 08/12/2012  . Sebaceous cyst 04/01/2012  . Other screening mammogram 05/15/2011  . History of colon polyps 05/15/2011  . Anemia, mild 05/15/2011  . ALLERGIC RHINITIS 03/02/2008  . OSTEOPENIA 01/05/2007  . HYPERCHOLESTEROLEMIA 10/24/2006  . ANXIETY 10/24/2006  . DEPRESSION 10/24/2006  . HYPERTENSION 10/24/2006  . HEMORRHOIDS 10/24/2006  . FIBROCYSTIC BREAST DISEASE 10/24/2006  . OSTEOARTHRITIS 10/24/2006   Past Medical History  Diagnosis Date  . Anxiety state, unspecified   . Depressive disorder, not elsewhere classified   .  Unspecified essential hypertension   . Osteoarthrosis, unspecified whether generalized or localized, unspecified site     hands  . Disorder of bone and cartilage, unspecified   . Colon polyps   . Allergic rhinitis, cause unspecified   . Diffuse cystic mastopathy   . Unspecified hemorrhoids without mention of complication   . Pure hypercholesterolemia   . Diverticulosis   . External hemorrhoids   . Anemia   . Osteoporosis    Past Surgical History  Procedure Laterality Date  . Cholecystectomy    . Hemorrhoid surgery    . Hand surgery      DIP fusion-left  . Bunionectomy  7/01    left    . Colonoscopy  10/03    polyp; hemorrhoids;diverticulosis  . Dexa  3/02    osteopenia  . Colonoscopy  1/07    diverticulosis  . Dexa  1/05    Stable  . Cesarean section  1981  . Tonsillectomy      childhood   History  Substance Use Topics  . Smoking status: Never Smoker   . Smokeless tobacco: Never Used  . Alcohol Use: 0.0 oz/week    0.5 drink(s) per week     Comment: Occasional-wine   Family History  Problem Relation Age of Onset  . Arthritis Father   . Lung cancer Father     + smoker  . Stroke Mother   . Coronary artery disease Mother 2  . Stroke Maternal Grandmother     hemorrhagic  . Uterine cancer Maternal Grandmother   . Bone cancer Paternal Uncle   . Colon cancer Brother     ? primary colon  . Liver cancer Brother   . Esophageal cancer Neg Hx   . Rectal cancer Neg Hx   . Stomach cancer Neg Hx    No Known Allergies Current Outpatient Prescriptions on File Prior to Visit  Medication Sig Dispense Refill  . amoxicillin-clavulanate (AUGMENTIN) 875-125 MG per tablet Take 1 tablet by mouth 2 (two) times daily.  20 tablet  0  . Azelastine HCl 0.15 % SOLN Place 2 sprays into the nose 2 (two) times daily.  30 mL  11  . Calcium Carbonate-Vit D-Min (CALCIUM 1200) 1200-1000 MG-UNIT CHEW Chew 1 tablet by mouth 2 (two) times daily.       . Cetirizine HCl 10 MG CAPS Take 1 capsule by mouth daily.      . cyanocobalamin 500 MCG tablet Take 500 mcg by mouth daily.      Marland Kitchen FLUoxetine (PROZAC) 20 MG capsule TAKE ONE CAPSULE BY MOUTH EVERY DAY  90 capsule  0  . IRON PO Take 1 tablet by mouth daily.      . Multiple Vitamin (MULTIVITAMIN) tablet Take 1 tablet by mouth daily.      . naproxen (NAPROSYN) 500 MG tablet Take 500 mg by mouth 2 (two) times daily with a meal.       No current facility-administered medications on file prior to visit.     Review of Systems Review of Systems  Constitutional: Negative for fever, appetite change, fatigue and unexpected weight change.   Eyes: Negative for pain and visual disturbance.  Respiratory: Negative for cough and shortness of breath.   Cardiovascular: Negative for cp or palpitations    Gastrointestinal: Negative for nausea, diarrhea and constipation.  Genitourinary: Negative for urgency and frequency.  Skin: Negative for pallor or rash   MSK pos for some hip and leg pain at times / stiffness  Neurological: Negative for weakness, light-headedness, numbness and headaches.  Hematological: Negative for adenopathy. Does not bruise/bleed easily.  Psychiatric/Behavioral: Negative for dysphoric mood. The patient is not nervous/anxious.         Objective:   Physical Exam  Constitutional: She appears well-developed and well-nourished. No distress.  HENT:  Head: Normocephalic and atraumatic.  Right Ear: External ear normal.  Left Ear: External ear normal.  Nose: Nose normal.  Mouth/Throat: Oropharynx is clear and moist.  Eyes: Conjunctivae and EOM are normal. Pupils are equal, round, and reactive to light. Right eye exhibits no discharge. Left eye exhibits no discharge. No scleral icterus.  Neck: Normal range of motion. Neck supple. No JVD present. No thyromegaly present.  Cardiovascular: Normal rate, regular rhythm, normal heart sounds and intact distal pulses.  Exam reveals no gallop.   Pulmonary/Chest: Effort normal and breath sounds normal. No respiratory distress. She has no wheezes. She has no rales.  Abdominal: Soft. Bowel sounds are normal. She exhibits no distension and no mass. There is no tenderness.  Genitourinary:  Breast exam: No mass, nodules, thickening, tenderness, bulging, retraction, inflamation, nipple discharge or skin changes noted.  No axillary or clavicular LA.      Musculoskeletal: She exhibits no edema and no tenderness.  Lymphadenopathy:    She has no cervical adenopathy.  Neurological: She is alert. She has normal reflexes. No cranial nerve deficit. She exhibits normal muscle tone.  Coordination normal.  Skin: Skin is warm and dry. No rash noted. No erythema. No pallor.  Psychiatric: She has a normal mood and affect.  Bright affect despite fatigue           Assessment & Plan:

## 2013-09-08 NOTE — Patient Instructions (Signed)
prevnar vaccine today  Stop at check out for bone density test referral  Continue great diet and exercise

## 2013-09-08 NOTE — Progress Notes (Signed)
Pre visit review using our clinic review tool, if applicable. No additional management support is needed unless otherwise documented below in the visit note. 

## 2013-09-09 NOTE — Assessment & Plan Note (Signed)
Disc goals for lipids and reasons to control them Rev labs with pt Rev low sat fat diet in detail Improved HDL  Urged to keep up good habits

## 2013-09-09 NOTE — Assessment & Plan Note (Signed)
Reviewed health habits including diet and exercise and skin cancer prevention Reviewed appropriate screening tests for age  Also reviewed health mt list, fam hx and immunization status , as well as social and family history   See HPI Labs reviewed  prevnar vaccine today   

## 2013-09-09 NOTE — Assessment & Plan Note (Signed)
Ref for dexa  No fractures  Good exercise Disc need for calcium/ vitamin D/ wt bearing exercise and bone density test every 2 y to monitor Disc safety/ fracture risk in detail

## 2013-09-09 NOTE — Assessment & Plan Note (Signed)
bp in fair control at this time  BP Readings from Last 1 Encounters:  09/08/13 130/78   No changes needed Disc lifstyle change with low sodium diet and exercise   Labs discussed

## 2013-12-16 ENCOUNTER — Other Ambulatory Visit: Payer: Self-pay | Admitting: Family Medicine

## 2013-12-16 NOTE — Telephone Encounter (Signed)
Please refill for 6 months 

## 2013-12-16 NOTE — Telephone Encounter (Signed)
Electronic refill request, please advise  

## 2013-12-16 NOTE — Telephone Encounter (Signed)
done

## 2014-03-28 ENCOUNTER — Ambulatory Visit: Payer: Medicare PPO

## 2014-03-28 ENCOUNTER — Ambulatory Visit (INDEPENDENT_AMBULATORY_CARE_PROVIDER_SITE_OTHER): Payer: Medicare PPO | Admitting: *Deleted

## 2014-03-28 DIAGNOSIS — Z23 Encounter for immunization: Secondary | ICD-10-CM

## 2014-04-15 DIAGNOSIS — E2839 Other primary ovarian failure: Secondary | ICD-10-CM

## 2014-04-15 HISTORY — DX: Other primary ovarian failure: E28.39

## 2014-08-12 ENCOUNTER — Other Ambulatory Visit: Payer: Self-pay | Admitting: Family Medicine

## 2014-08-12 NOTE — Telephone Encounter (Signed)
done

## 2014-08-12 NOTE — Telephone Encounter (Signed)
Please refill to get through to her PE thanks

## 2014-08-12 NOTE — Telephone Encounter (Signed)
Electronic refill request, CPE scheduled 02/03/15 and last refilled on 12/16/13 #90 with 1 additional refill, please advise

## 2014-09-19 ENCOUNTER — Encounter: Payer: Self-pay | Admitting: Internal Medicine

## 2015-01-25 ENCOUNTER — Telehealth: Payer: Self-pay | Admitting: Family Medicine

## 2015-01-25 DIAGNOSIS — I10 Essential (primary) hypertension: Secondary | ICD-10-CM

## 2015-01-25 DIAGNOSIS — E7800 Pure hypercholesterolemia, unspecified: Secondary | ICD-10-CM

## 2015-01-25 NOTE — Telephone Encounter (Signed)
-----   Message from Ellamae Sia sent at 01/18/2015  5:43 PM EDT ----- Regarding: Lab orders for Thursday, 10.13.16 Patient is scheduled for CPX labs, please order future labs, Thanks , Karna Christmas

## 2015-01-26 ENCOUNTER — Other Ambulatory Visit (INDEPENDENT_AMBULATORY_CARE_PROVIDER_SITE_OTHER): Payer: Medicare PPO

## 2015-01-26 DIAGNOSIS — E7800 Pure hypercholesterolemia, unspecified: Secondary | ICD-10-CM

## 2015-01-26 DIAGNOSIS — I10 Essential (primary) hypertension: Secondary | ICD-10-CM

## 2015-01-26 LAB — CBC WITH DIFFERENTIAL/PLATELET
BASOS ABS: 0 10*3/uL (ref 0.0–0.1)
BASOS PCT: 0.5 % (ref 0.0–3.0)
Eosinophils Absolute: 0.1 10*3/uL (ref 0.0–0.7)
Eosinophils Relative: 1.2 % (ref 0.0–5.0)
HEMATOCRIT: 39.6 % (ref 36.0–46.0)
Hemoglobin: 13.4 g/dL (ref 12.0–15.0)
Lymphocytes Relative: 34.1 % (ref 12.0–46.0)
Lymphs Abs: 1.7 10*3/uL (ref 0.7–4.0)
MCHC: 34 g/dL (ref 30.0–36.0)
MCV: 94.7 fl (ref 78.0–100.0)
MONOS PCT: 8.8 % (ref 3.0–12.0)
Monocytes Absolute: 0.4 10*3/uL (ref 0.1–1.0)
NEUTROS ABS: 2.8 10*3/uL (ref 1.4–7.7)
Neutrophils Relative %: 55.4 % (ref 43.0–77.0)
Platelets: 215 10*3/uL (ref 150.0–400.0)
RBC: 4.18 Mil/uL (ref 3.87–5.11)
RDW: 13.6 % (ref 11.5–15.5)
WBC: 5 10*3/uL (ref 4.0–10.5)

## 2015-01-26 LAB — COMPREHENSIVE METABOLIC PANEL
ALBUMIN: 3.9 g/dL (ref 3.5–5.2)
ALT: 16 U/L (ref 0–35)
AST: 20 U/L (ref 0–37)
Alkaline Phosphatase: 83 U/L (ref 39–117)
BUN: 21 mg/dL (ref 6–23)
CALCIUM: 9.5 mg/dL (ref 8.4–10.5)
CHLORIDE: 106 meq/L (ref 96–112)
CO2: 31 mEq/L (ref 19–32)
Creatinine, Ser: 0.74 mg/dL (ref 0.40–1.20)
GFR: 81.96 mL/min (ref >60.00)
Glucose, Bld: 97 mg/dL (ref 70–99)
POTASSIUM: 4.3 meq/L (ref 3.5–5.1)
Sodium: 141 mEq/L (ref 135–145)
Total Bilirubin: 0.4 mg/dL (ref 0.2–1.2)
Total Protein: 6.8 g/dL (ref 6.0–8.3)

## 2015-01-26 LAB — LIPID PANEL
CHOL/HDL RATIO: 4
CHOLESTEROL: 181 mg/dL (ref 0–200)
HDL: 43.1 mg/dL (ref >39.00)
LDL CALC: 115 mg/dL — AB (ref 0–99)
NonHDL: 138.11
TRIGLYCERIDES: 114 mg/dL (ref 0.0–149.0)
VLDL: 22.8 mg/dL (ref 0.0–40.0)

## 2015-01-26 LAB — TSH: TSH: 2.15 u[IU]/mL (ref 0.35–4.50)

## 2015-02-03 ENCOUNTER — Encounter: Payer: Self-pay | Admitting: Family Medicine

## 2015-02-03 ENCOUNTER — Ambulatory Visit (INDEPENDENT_AMBULATORY_CARE_PROVIDER_SITE_OTHER): Payer: Medicare PPO | Admitting: Family Medicine

## 2015-02-03 VITALS — BP 140/86 | HR 66 | Temp 98.1°F | Ht 61.5 in | Wt 135.8 lb

## 2015-02-03 DIAGNOSIS — Z23 Encounter for immunization: Secondary | ICD-10-CM | POA: Diagnosis not present

## 2015-02-03 DIAGNOSIS — Z0000 Encounter for general adult medical examination without abnormal findings: Secondary | ICD-10-CM

## 2015-02-03 DIAGNOSIS — I10 Essential (primary) hypertension: Secondary | ICD-10-CM

## 2015-02-03 DIAGNOSIS — E2839 Other primary ovarian failure: Secondary | ICD-10-CM

## 2015-02-03 DIAGNOSIS — M8580 Other specified disorders of bone density and structure, unspecified site: Secondary | ICD-10-CM

## 2015-02-03 DIAGNOSIS — E7800 Pure hypercholesterolemia, unspecified: Secondary | ICD-10-CM

## 2015-02-03 NOTE — Progress Notes (Signed)
Subjective:    Patient ID: Sierra Sparks, female    DOB: Dec 12, 1940, 74 y.o.   MRN: [id number]  HPI Here for annual medicare wellness visit as well as chronic/acute medical problems along with annual preventative exam   I have personally reviewed the Medicare Annual Wellness questionnaire and have noted 1. The patient's medical and social history 2. Their use of alcohol, tobacco or illicit drugs 3. Their current medications and supplements 4. The patient's functional ability including ADL's, fall risks, home safety risks and hearing or visual             impairment. 5. Diet and physical activities 6. Evidence for depression or mood disorders  The patients weight, height, BMI have been recorded in the chart and visual acuity is per eye clinic.  I have made referrals, counseling and provided education to the patient based review of the above and I have provided the pt with a written personalized care plan for preventive services. Reviewed and updated provider list, see scanned forms.  Is doing well  Taking care of herself as usual Retired from [employer]    See scanned forms.  Routine anticipatory guidance given to patient.  See health maintenance. Colon cancer screening 3/13 - 5 year recall / polyps / family history  Breast cancer screening 3/13 - overdue  Self breast exam-no new lumps (has fibrocystic breasts) -has to watch caffeine  Flu vaccine - had it today  Tetanus vaccine 8/07 Pneumovax - complete 5/15 Zoster vaccine 2/12  dexa 11/11 osteopenia, no falls or fractures, takes her ca and D - wants to schedule dexa  Advance directive- has a living will and POA  Cognitive function addressed- see scanned forms- and if abnormal then additional documentation follows.  No significant memory problems   PMH and SH reviewed  Meds, vitals, and allergies reviewed.   ROS: See HPI.  Otherwise negative.    Husband had back surgery - she is tired taking care of him  Not  getting as much sleep     bp is stable today (a little higher than usual- sleep deprived)  No cp or palpitations or headaches or edema  No side effects to medicines  BP Readings from Last 3 Encounters:  02/03/15 140/86  09/08/13 130/78  06/09/13 136/82     Wt is up 2 lb with bmi of 25  cholesterol Lab Results  Component Value Date   CHOL 181 01/26/2015   CHOL 201* 09/01/2013   CHOL 201* 08/05/2012   Lab Results  Component Value Date   HDL 43.10 01/26/2015   HDL 54.40 09/01/2013   HDL 39.40 08/05/2012   Lab Results  Component Value Date   LDLCALC 115* 01/26/2015   LDLCALC 132* 09/01/2013   LDLCALC 118* 05/08/2011   Lab Results  Component Value Date   TRIG 114.0 01/26/2015   TRIG 75.0 09/01/2013   TRIG 117.0 08/05/2012   Lab Results  Component Value Date   CHOLHDL 4 01/26/2015   CHOLHDL 4 09/01/2013   CHOLHDL 5 08/05/2012   Lab Results  Component Value Date   LDLDIRECT 143.2 08/05/2012    Stable with dec HDL from less exercise - will get back to it when her husband gets better   Cbc is normal Lab Results  Component Value Date   WBC 5.0 01/26/2015   HGB 13.4 01/26/2015   HCT 39.6 01/26/2015   MCV 94.7 01/26/2015   PLT 215.0 01/26/2015   Did stop her iron  Patient Active Problem List   Diagnosis Date Noted  . Routine general medical examination at a health care facility 02/03/2015  . Encounter for Medicare annual wellness exam 08/12/2012  . Glaucoma 08/12/2012  . Sebaceous cyst 04/01/2012  . Other screening mammogram 05/15/2011  . History of colon polyps 05/15/2011  . ALLERGIC RHINITIS 03/02/2008  . Osteopenia 01/05/2007  . HYPERCHOLESTEROLEMIA 10/24/2006  . ANXIETY 10/24/2006  . DEPRESSION 10/24/2006  . Essential hypertension 10/24/2006  . HEMORRHOIDS 10/24/2006  . FIBROCYSTIC BREAST DISEASE 10/24/2006  . OSTEOARTHRITIS 10/24/2006   Past Medical History  Diagnosis Date  . Anxiety state, unspecified   . Depressive disorder, not elsewhere  classified   . Unspecified essential hypertension   . Osteoarthrosis, unspecified whether generalized or localized, unspecified site     hands  . Disorder of bone and cartilage, unspecified   . Colon polyps   . Allergic rhinitis, cause unspecified   . Diffuse cystic mastopathy   . Unspecified hemorrhoids without mention of complication   . Pure hypercholesterolemia   . Diverticulosis   . External hemorrhoids   . Anemia   . Osteoporosis    Past Surgical History  Procedure Laterality Date  . Cholecystectomy    . Hemorrhoid surgery    . Hand surgery      DIP fusion-left  . Bunionectomy  7/01    left   . Colonoscopy  10/03    polyp; hemorrhoids;diverticulosis  . Dexa  3/02    osteopenia  . Colonoscopy  1/07    diverticulosis  . Dexa  1/05    Stable  . Cesarean section  1981  . Tonsillectomy      childhood   Social History  Substance Use Topics  . Smoking status: Never Smoker   . Smokeless tobacco: Never Used  . Alcohol Use: 0.0 oz/week    0 Standard drinks or equivalent per week     Comment: Occasional-wine   Family History  Problem Relation Age of Onset  . Arthritis Father   . Lung cancer Father     + smoker  . Stroke Mother   . Coronary artery disease Mother 45  . Stroke Maternal Grandmother     hemorrhagic  . Uterine cancer Maternal Grandmother   . Bone cancer Paternal Uncle   . Colon cancer Brother     ? primary colon  . Liver cancer Brother   . Esophageal cancer Neg Hx   . Rectal cancer Neg Hx   . Stomach cancer Neg Hx    No Known Allergies Current Outpatient Prescriptions on File Prior to Visit  Medication Sig Dispense Refill  . Azelastine HCl 0.15 % SOLN Place 2 sprays into the nose 2 (two) times daily. 30 mL 11  . Calcium Carbonate-Vit D-Min (CALCIUM 1200) 1200-1000 MG-UNIT CHEW Chew 1 tablet by mouth 2 (two) times daily.     . Cetirizine HCl 10 MG CAPS Take 1 capsule by mouth daily.    . cyanocobalamin 500 MCG tablet Take 500 mcg by mouth daily.     Marland Kitchen FLUoxetine (PROZAC) 20 MG capsule TAKE ONE CAPSULE BY MOUTH ONCE DAILY 90 capsule 1  . IRON PO Take 1 tablet by mouth daily.    . Multiple Vitamin (MULTIVITAMIN) tablet Take 1 tablet by mouth daily.    . naproxen (NAPROSYN) 500 MG tablet Take 500 mg by mouth 2 (two) times daily with a meal.     No current facility-administered medications on file prior to visit.  Review of Systems Review of Systems  Constitutional: Negative for fever, appetite change, fatigue and unexpected weight change.  Eyes: Negative for pain and visual disturbance.  Respiratory: Negative for cough and shortness of breath.   Cardiovascular: Negative for cp or palpitations    Gastrointestinal: Negative for nausea, diarrhea and constipation.  Genitourinary: Negative for urgency and frequency.  Skin: Negative for pallor or rash   Neurological: Negative for weakness, light-headedness, numbness and headaches.  Hematological: Negative for adenopathy. Does not bruise/bleed easily.  Psychiatric/Behavioral: Negative for dysphoric mood. The patient is not nervous/anxious.  pos for stressors        Objective:   Physical Exam  Constitutional: She appears well-developed and well-nourished. No distress.  Well appearing   HENT:  Head: Normocephalic and atraumatic.  Right Ear: External ear normal.  Left Ear: External ear normal.  Nose: Nose normal.  Mouth/Throat: Oropharynx is clear and moist.  Eyes: Conjunctivae and EOM are normal. Pupils are equal, round, and reactive to light. Right eye exhibits no discharge. Left eye exhibits no discharge. No scleral icterus.  Neck: Normal range of motion. Neck supple. No JVD present. Carotid bruit is not present. No thyromegaly present.  Cardiovascular: Normal rate, regular rhythm, normal heart sounds and intact distal pulses.  Exam reveals no gallop.   Pulmonary/Chest: Effort normal and breath sounds normal. No respiratory distress. She has no wheezes. She has no rales.    Abdominal: Soft. Bowel sounds are normal. She exhibits no distension and no mass. There is no tenderness.  Genitourinary: No breast swelling, tenderness, discharge or bleeding.  Breast exam: No mass, nodules, thickening, tenderness, bulging, retraction, inflamation, nipple discharge or skin changes noted.  No axillary or clavicular LA.      Musculoskeletal: She exhibits no edema or tenderness.  No kyphosis   Lymphadenopathy:    She has no cervical adenopathy.  Neurological: She is alert. She has normal reflexes. No cranial nerve deficit. She exhibits normal muscle tone. Coordination normal.  Skin: Skin is warm and dry. No rash noted. No erythema. No pallor.  Psychiatric: She has a normal mood and affect.          Assessment & Plan:   Problem List Items Addressed This Visit      Cardiovascular and Mediastinum   Essential hypertension    bp in fair control at this time  BP Readings from Last 1 Encounters:  02/03/15 140/86   No changes needed Disc lifstyle change with low sodium diet and exercise  Labs reviewed         Musculoskeletal and Integument   Osteopenia    Ref for dexa  Disc need for calcium/ vitamin D/ wt bearing exercise and bone density test every 2 y to monitor Disc safety/ fracture risk in detail    No falls or fractures         Other   Encounter for Medicare annual wellness exam - Primary    Reviewed health habits including diet and exercise and skin cancer prevention Reviewed appropriate screening tests for age  Also reviewed health mt list, fam hx and immunization status , as well as social and family history   See HPI Labs reviewed Don't forget to schedule your mammogram  Stop at check out to schedule a bone density test  Stay off iron  Take care of yourself  Flu shot today      Estrogen deficiency   Relevant Orders   DG Bone Density   HYPERCHOLESTEROLEMIA    Disc  goals for lipids and reasons to control them Rev labs with pt Rev low sat  fat diet in detail HDL is down due to less exercise - will improve after her husband recovers       Routine general medical examination at a health care facility    Reviewed health habits including diet and exercise and skin cancer prevention Reviewed appropriate screening tests for age  Also reviewed health mt list, fam hx and immunization status , as well as social and family history   See HPI Labs reviewed Don't forget to schedule your mammogram  Stop at check out to schedule a bone density test  Stay off iron  Take care of yourself  Flu shot today        Other Visit Diagnoses    Need for influenza vaccination        Relevant Orders    Flu Vaccine QUAD 36+ mos PF IM (Fluarix & Fluzone Quad PF) (Completed)

## 2015-02-03 NOTE — Patient Instructions (Addendum)
Don't forget to schedule your mammogram  Stop at check out to schedule a bone density test  Stay off iron  Take care of yourself  Flu shot today

## 2015-02-03 NOTE — Progress Notes (Signed)
Pre visit review using our clinic review tool, if applicable. No additional management support is needed unless otherwise documented below in the visit note. 

## 2015-02-05 NOTE — Assessment & Plan Note (Signed)
bp in fair control at this time  BP Readings from Last 1 Encounters:  02/03/15 140/86   No changes needed Disc lifstyle change with low sodium diet and exercise  Labs reviewed

## 2015-02-05 NOTE — Assessment & Plan Note (Signed)
Disc goals for lipids and reasons to control them Rev labs with pt Rev low sat fat diet in detail HDL is down due to less exercise - will improve after her husband recovers

## 2015-02-05 NOTE — Assessment & Plan Note (Signed)
Ref for dexa  Disc need for calcium/ vitamin D/ wt bearing exercise and bone density test every 2 y to monitor Disc safety/ fracture risk in detail    No falls or fractures

## 2015-02-05 NOTE — Assessment & Plan Note (Signed)
Reviewed health habits including diet and exercise and skin cancer prevention Reviewed appropriate screening tests for age  Also reviewed health mt list, fam hx and immunization status , as well as social and family history   See HPI Labs reviewed Don't forget to schedule your mammogram  Stop at check out to schedule a bone density test  Stay off iron  Take care of yourself  Flu shot today

## 2015-02-08 ENCOUNTER — Ambulatory Visit
Admission: RE | Admit: 2015-02-08 | Discharge: 2015-02-08 | Disposition: A | Discharge status: Home / Self Care | Payer: Medicare PPO | Source: Ambulatory Visit | Attending: Family Medicine | Admitting: Family Medicine

## 2015-02-08 ENCOUNTER — Other Ambulatory Visit: Payer: Self-pay | Admitting: Family Medicine

## 2015-02-08 DIAGNOSIS — Z780 Asymptomatic menopausal state: Secondary | ICD-10-CM | POA: Diagnosis not present

## 2015-02-08 DIAGNOSIS — Z1231 Encounter for screening mammogram for malignant neoplasm of breast: Secondary | ICD-10-CM

## 2015-02-08 DIAGNOSIS — E2839 Other primary ovarian failure: Secondary | ICD-10-CM | POA: Insufficient documentation

## 2015-02-08 LAB — HM DEXA SCAN

## 2015-02-09 ENCOUNTER — Ambulatory Visit
Admission: RE | Admit: 2015-02-09 | Discharge: 2015-02-09 | Disposition: A | Discharge status: Home / Self Care | Payer: Medicare PPO | Source: Ambulatory Visit | Attending: Family Medicine | Admitting: Family Medicine

## 2015-02-09 ENCOUNTER — Encounter: Payer: Self-pay | Admitting: Family Medicine

## 2015-02-09 ENCOUNTER — Encounter: Payer: Self-pay | Admitting: *Deleted

## 2015-02-09 ENCOUNTER — Other Ambulatory Visit: Payer: Self-pay | Admitting: Family Medicine

## 2015-02-09 DIAGNOSIS — Z1231 Encounter for screening mammogram for malignant neoplasm of breast: Secondary | ICD-10-CM | POA: Diagnosis not present

## 2015-02-09 LAB — HM MAMMOGRAPHY: HM MAMMO: NORMAL

## 2015-02-13 ENCOUNTER — Encounter: Payer: Self-pay | Admitting: Family Medicine

## 2015-02-13 ENCOUNTER — Encounter: Payer: Self-pay | Admitting: *Deleted

## 2015-03-12 ENCOUNTER — Other Ambulatory Visit: Payer: Self-pay | Admitting: Family Medicine

## 2015-03-13 NOTE — Telephone Encounter (Signed)
Please refill for a year  

## 2015-03-13 NOTE — Telephone Encounter (Signed)
Electronic refill request, pt had CPE on 02/03/15, last refilled on 08/12/14 #90 with 1 refill, please advise

## 2015-03-13 NOTE — Telephone Encounter (Signed)
done

## 2015-04-16 DIAGNOSIS — K602 Anal fissure, unspecified: Secondary | ICD-10-CM

## 2015-04-16 HISTORY — DX: Anal fissure, unspecified: K60.2

## 2016-02-21 ENCOUNTER — Ambulatory Visit (INDEPENDENT_AMBULATORY_CARE_PROVIDER_SITE_OTHER): Payer: Medicare Other

## 2016-02-21 ENCOUNTER — Telehealth: Payer: Self-pay | Admitting: Family Medicine

## 2016-02-21 VITALS — BP 130/72 | HR 64 | Temp 98.6°F | Ht 61.5 in | Wt 151.2 lb

## 2016-02-21 DIAGNOSIS — Z23 Encounter for immunization: Secondary | ICD-10-CM

## 2016-02-21 DIAGNOSIS — Z0000 Encounter for general adult medical examination without abnormal findings: Secondary | ICD-10-CM | POA: Diagnosis not present

## 2016-02-21 LAB — COMPREHENSIVE METABOLIC PANEL
ALK PHOS: 92 U/L (ref 39–117)
ALT: 12 U/L (ref 0–35)
AST: 18 U/L (ref 0–37)
Albumin: 4.3 g/dL (ref 3.5–5.2)
BILIRUBIN TOTAL: 0.6 mg/dL (ref 0.2–1.2)
BUN: 29 mg/dL — AB (ref 6–23)
CO2: 31 meq/L (ref 19–32)
CREATININE: 0.72 mg/dL (ref 0.40–1.20)
Calcium: 10 mg/dL (ref 8.4–10.5)
Chloride: 103 mEq/L (ref 96–112)
GFR: 84.35 mL/min (ref >60.00)
GLUCOSE: 96 mg/dL (ref 70–99)
Potassium: 4.1 mEq/L (ref 3.5–5.1)
SODIUM: 139 meq/L (ref 135–145)
TOTAL PROTEIN: 7.5 g/dL (ref 6.0–8.3)

## 2016-02-21 LAB — CBC WITH DIFFERENTIAL/PLATELET
BASOS ABS: 0 10*3/uL (ref 0.0–0.1)
Basophils Relative: 0.6 % (ref 0.0–3.0)
EOS ABS: 0.1 10*3/uL (ref 0.0–0.7)
Eosinophils Relative: 1.2 % (ref 0.0–5.0)
HCT: 38.9 % (ref 36.0–46.0)
Hemoglobin: 13.1 g/dL (ref 12.0–15.0)
LYMPHS ABS: 1.7 10*3/uL (ref 0.7–4.0)
Lymphocytes Relative: 40.6 % (ref 12.0–46.0)
MCHC: 33.7 g/dL (ref 30.0–36.0)
MCV: 91.7 fl (ref 78.0–100.0)
MONO ABS: 0.5 10*3/uL (ref 0.1–1.0)
MONOS PCT: 11.3 % (ref 3.0–12.0)
NEUTROS ABS: 2 10*3/uL (ref 1.4–7.7)
NEUTROS PCT: 46.3 % (ref 43.0–77.0)
PLATELETS: 242 10*3/uL (ref 150.0–400.0)
RBC: 4.24 Mil/uL (ref 3.87–5.11)
RDW: 14.1 % (ref 11.5–15.5)
WBC: 4.3 10*3/uL (ref 4.0–10.5)

## 2016-02-21 LAB — LIPID PANEL
CHOL/HDL RATIO: 4
Cholesterol: 223 mg/dL — ABNORMAL HIGH (ref 0–200)
HDL: 51.5 mg/dL (ref >39.00)
LDL Cholesterol: 154 mg/dL — ABNORMAL HIGH (ref 0–99)
NONHDL: 171.33
Triglycerides: 86 mg/dL (ref 0.0–149.0)
VLDL: 17.2 mg/dL (ref 0.0–40.0)

## 2016-02-21 LAB — TSH: TSH: 1.74 u[IU]/mL (ref 0.35–4.50)

## 2016-02-21 NOTE — Telephone Encounter (Signed)
-----   Message from Eustace Pen, LPN sent at [phone]  9:17 PM EST ----- Regarding: Lab Orders for 02/21/16 Thank you. :)

## 2016-02-21 NOTE — Patient Instructions (Signed)
Sierra Sparks , Thank you for taking time to come for your Medicare Wellness Visit. I appreciate your ongoing commitment to your health goals. Please review the following plan we discussed and let me know if I can assist you in the future.   These are the goals we discussed: Goals    . Increase physical activity          Starting 02/21/2016, I will continue to exercise for at least 60 min 4-5 days per week.        This is a list of the screening recommended for you and due dates:  Health Maintenance  Topic Date Due  . Mammogram  04/14/2016*  . Colon Cancer Screening  07/10/2016  . Tetanus Vaccine  02/20/2026  . Flu Shot  Completed  . DEXA scan (bone density measurement)  Completed  . Shingles Vaccine  Completed  . Pneumonia vaccines  Completed  *Topic was postponed. The date shown is not the original due date.   Preventive Care for Adults  A healthy lifestyle and preventive care can promote health and wellness. Preventive health guidelines for adults include the following key practices.  . A routine yearly physical is a good way to check with your health care provider about your health and preventive screening. It is a chance to share any concerns and updates on your health and to receive a thorough exam.  . Visit your dentist for a routine exam and preventive care every 6 months. Brush your teeth twice a day and floss once a day. Good oral hygiene prevents tooth decay and gum disease.  . The frequency of eye exams is based on your age, health, family medical history, use  of contact lenses, and other factors. Follow your health care provider's ecommendations for frequency of eye exams.  . Eat a healthy diet. Foods like vegetables, fruits, whole grains, low-fat dairy products, and lean protein foods contain the nutrients you need without too many calories. Decrease your intake of foods high in solid fats, added sugars, and salt. Eat the right amount of calories for you. Get  information about a proper diet from your health care provider, if necessary.  . Regular physical exercise is one of the most important things you can do for your health. Most adults should get at least 150 minutes of moderate-intensity exercise (any activity that increases your heart rate and causes you to sweat) each week. In addition, most adults need muscle-strengthening exercises on 2 or more days a week.  Silver Sneakers may be a benefit available to you. To determine eligibility, you may visit the website: www.silversneakers.com or contact program at 954-146-0690 Mon-Fri between 8AM-8PM.   . Maintain a healthy weight. The body mass index (BMI) is a screening tool to identify possible weight problems. It provides an estimate of body fat based on height and weight. Your health care provider can find your BMI and can help you achieve or maintain a healthy weight.   For adults 20 years and older: ? A BMI below 18.5 is considered underweight. ? A BMI of 18.5 to 24.9 is normal. ? A BMI of 25 to 29.9 is considered overweight. ? A BMI of 30 and above is considered obese.   . Maintain normal blood lipids and cholesterol levels by exercising and minimizing your intake of saturated fat. Eat a balanced diet with plenty of fruit and vegetables. Blood tests for lipids and cholesterol should begin at age 19 and be repeated every 5 years. If your  lipid or cholesterol levels are high, you are over 50, or you are at high risk for heart disease, you may need your cholesterol levels checked more frequently. Ongoing high lipid and cholesterol levels should be treated with medicines if diet and exercise are not working.  . If you smoke, find out from your health care provider how to quit. If you do not use tobacco, please do not start.  . If you choose to drink alcohol, please do not consume more than 2 drinks per day. One drink is considered to be 12 ounces (355 mL) of beer, 5 ounces (148 mL) of wine, or 1.5  ounces (44 mL) of liquor.  . If you are 74-75 years old, ask your health care provider if you should take aspirin to prevent strokes.  . Use sunscreen. Apply sunscreen liberally and repeatedly throughout the day. You should seek shade when your shadow is shorter than you. Protect yourself by wearing long sleeves, pants, a wide-brimmed hat, and sunglasses year round, whenever you are outdoors.  . Once a month, do a whole body skin exam, using a mirror to look at the skin on your back. Tell your health care provider of new moles, moles that have irregular borders, moles that are larger than a pencil eraser, or moles that have changed in shape or color.

## 2016-02-22 NOTE — Progress Notes (Signed)
Pre visit review using our clinic review tool, if applicable. No additional management support is needed unless otherwise documented below in the visit note. 

## 2016-02-23 NOTE — Progress Notes (Signed)
PCP notes:   Health maintenance:  Flu vaccine - administered Tetanus vaccine - administered Mammogram - pt will schedule appt  Abnormal screenings:   None  Patient concerns:   None  Nurse concerns:  None  Next PCP appt:   02/26/2016 @ 0830  I reviewed health advisor's note, was available for consultation, and agree with documentation and plan. Loura Pardon MD

## 2016-02-23 NOTE — Progress Notes (Signed)
Subjective:   Sierra Sparks is a 75 y.o. female who presents for Medicare Annual (Subsequent) preventive examination.  Review of Systems:  N/A Cardiac Risk Factors include: advanced age (>32men, >10 women);dyslipidemia;hypertension     Objective:     Vitals: BP 130/72 (BP Location: Left Arm, Patient Position: Sitting, Cuff Size: Normal)   Pulse 64   Temp 98.6 F (37 C) (Oral)   Ht 5' 1.5" (1.562 m)   Wt 151 lb 4 oz (68.6 kg)   SpO2 94%   BMI 28.12 kg/m   Body mass index is 28.12 kg/m.   Tobacco History  Smoking Status  . Never Smoker  Smokeless Tobacco  . Never Used     Counseling given: No   Past Medical History:  Diagnosis Date  . Allergic rhinitis, cause unspecified   . Anemia   . Anxiety state, unspecified   . Colon polyps   . Depressive disorder, not elsewhere classified   . Diffuse cystic mastopathy   . Disorder of bone and cartilage, unspecified   . Diverticulosis   . External hemorrhoids   . Osteoarthrosis, unspecified whether generalized or localized, unspecified site    hands  . Osteoporosis   . Pure hypercholesterolemia   . Unspecified essential hypertension   . Unspecified hemorrhoids without mention of complication    Past Surgical History:  Procedure Laterality Date  . BUNIONECTOMY  7/01   left   . CESAREAN SECTION  1981  . CHOLECYSTECTOMY    . COLONOSCOPY  10/03   polyp; hemorrhoids;diverticulosis  . COLONOSCOPY  1/07   diverticulosis  . DEXA  3/02   osteopenia  . DEXA  1/05   Stable  . HAND SURGERY     DIP fusion-left  . HEMORRHOID SURGERY    . TONSILLECTOMY     childhood   Family History  Problem Relation Age of Onset  . Colon cancer Brother     ? primary colon  . Arthritis Father   . Lung cancer Father     + smoker  . Stroke Mother   . Coronary artery disease Mother 18  . Stroke Maternal Grandmother     hemorrhagic  . Uterine cancer Maternal Grandmother   . Bone cancer Paternal Uncle   . Liver cancer Brother    . Esophageal cancer Neg Hx   . Rectal cancer Neg Hx   . Stomach cancer Neg Hx   . Breast cancer Neg Hx    History  Sexual Activity  . Sexual activity: Yes    Outpatient Encounter Prescriptions as of 02/21/2016  Medication Sig  . Azelastine HCl 0.15 % SOLN Place 2 sprays into the nose 2 (two) times daily. (Patient taking differently: Place 2 sprays into the nose as needed. )  . Calcium Carbonate-Vit D-Min (CALCIUM 1200) 1200-1000 MG-UNIT CHEW Chew 1 tablet by mouth 2 (two) times daily.   . Cetirizine HCl 10 MG CAPS Take 1 capsule by mouth daily.  Marland Kitchen FLUoxetine (PROZAC) 20 MG capsule TAKE ONE CAPSULE BY MOUTH ONCE DAILY  . KRILL OIL PO Take 1 capsule by mouth daily.  . Multiple Vitamin (MULTIVITAMIN) tablet Take 1 tablet by mouth daily.  . naproxen (NAPROSYN) 500 MG tablet Take 250 mg by mouth 2 (two) times daily with a meal.   . [DISCONTINUED] cyanocobalamin 500 MCG tablet Take 500 mcg by mouth daily.   No facility-administered encounter medications on file as of 02/21/2016.     Activities of Daily Living In your present state  of health, do you have any difficulty performing the following activities: 02/21/2016  Hearing? Y  Vision? Y  Difficulty concentrating or making decisions? N  Walking or climbing stairs? N  Dressing or bathing? N  Doing errands, shopping? N  Preparing Food and eating ? N  Using the Toilet? N  In the past six months, have you accidently leaked urine? N  Do you have problems with loss of bowel control? N  Managing your Medications? N  Managing your Finances? N  Housekeeping or managing your Housekeeping? N  Some recent data might be hidden    Patient Care Team: Abner Greenspan, MD as PCP - General    Assessment:     Hearing Screening   125Hz  250Hz  500Hz  1000Hz  2000Hz  3000Hz  4000Hz  6000Hz  8000Hz   Right ear:   40 40 40  40    Left ear:   40 40 40  40      Visual Acuity Screening   Right eye Left eye Both eyes  Without correction:     With  correction: 20/40 20/25 20/25     Exercise Activities and Dietary recommendations Current Exercise Habits: Home exercise routine, Type of exercise: strength training/weights;stretching;treadmill, Time (Minutes): 60, Frequency (Times/Week): 5, Weekly Exercise (Minutes/Week): 300, Intensity: Moderate, Exercise limited by: None identified  Goals    . Increase physical activity          Starting 02/21/2016, I will continue to exercise for at least 60 min 4-5 days per week.       Fall Risk Fall Risk  02/21/2016 02/03/2015 09/08/2013  Falls in the past year? No No Yes  Number falls in past yr: - - 2 or more  Risk for fall due to : - - Impaired balance/gait   Depression Screen PHQ 2/9 Scores 02/21/2016 02/03/2015 09/08/2013  PHQ - 2 Score 0 0 0     Cognitive Function MMSE - Mini Mental State Exam 02/21/2016  Orientation to time 5  Orientation to Place 5  Registration 3  Attention/ Calculation 0  Recall 3  Language- name 2 objects 0  Language- repeat 1  Language- follow 3 step command 3  Language- read & follow direction 0  Write a sentence 0  Copy design 0  Total score 20       PLEASE NOTE: A Mini-Cog screen was completed. Maximum score is 20. A value of 0 denotes this part of Folstein MMSE was not completed or the patient failed this part of the Mini-Cog screening.   Mini-Cog Screening Orientation to Time - Max 5 pts Orientation to Place - Max 5 pts Registration - Max 3 pts Recall - Max 3 pts Language Repeat - Max 1 pts Language Follow 3 Step Command - Max 3 pts   Immunization History  Administered Date(s) Administered  . Influenza Split 02/13/2011, 04/14/2012  . Influenza Whole 03/13/2006, 03/26/2007, 01/14/2008, 02/16/2009, 02/28/2010  . Influenza,inj,Quad PF,36+ Mos 03/17/2013, 03/28/2014, 02/03/2015, 02/21/2016  . Pneumococcal Conjugate-13 09/08/2013  . Pneumococcal Polysaccharide-23 02/28/2010  . Td 10/27/1996, 11/13/2005, 02/21/2016  . Zoster 05/24/2010    Screening Tests Health Maintenance  Topic Date Due  . MAMMOGRAM  04/14/2016 (Originally 02/09/2016)  . COLONOSCOPY  07/10/2016  . TETANUS/TDAP  02/20/2026  . INFLUENZA VACCINE  Completed  . DEXA SCAN  Completed  . ZOSTAVAX  Completed  . PNA vac Low Risk Adult  Completed      Plan:     I have personally reviewed and addressed the Medicare Annual Wellness questionnaire and  have noted the following in the patient's chart:  A. Medical and social history B. Use of alcohol, tobacco or illicit drugs  C. Current medications and supplements D. Functional ability and status E.  Nutritional status F.  Physical activity G. Advance directives H. List of other physicians I.  Hospitalizations, surgeries, and ER visits in previous 12 months J.  [place] to include hearing, vision, cognitive, depression L. Referrals and appointments - none  In addition, I have reviewed and discussed with patient certain preventive protocols, quality metrics, and best practice recommendations. A written personalized care plan for preventive services as well as general preventive health recommendations were provided to patient.  See attached scanned questionnaire for additional information.   Signed,   Lindell Noe, MHA, BS, LPN Health Coach

## 2016-02-26 ENCOUNTER — Ambulatory Visit (INDEPENDENT_AMBULATORY_CARE_PROVIDER_SITE_OTHER): Payer: Medicare Other | Admitting: Family Medicine

## 2016-02-26 ENCOUNTER — Encounter: Payer: Self-pay | Admitting: Family Medicine

## 2016-02-26 ENCOUNTER — Encounter: Payer: Self-pay | Admitting: *Deleted

## 2016-02-26 VITALS — BP 136/76 | HR 70 | Temp 98.5°F | Ht 61.5 in | Wt 154.2 lb

## 2016-02-26 DIAGNOSIS — E7800 Pure hypercholesterolemia, unspecified: Secondary | ICD-10-CM | POA: Diagnosis not present

## 2016-02-26 DIAGNOSIS — Z0000 Encounter for general adult medical examination without abnormal findings: Secondary | ICD-10-CM | POA: Diagnosis not present

## 2016-02-26 DIAGNOSIS — I10 Essential (primary) hypertension: Secondary | ICD-10-CM | POA: Diagnosis not present

## 2016-02-26 DIAGNOSIS — M8589 Other specified disorders of bone density and structure, multiple sites: Secondary | ICD-10-CM

## 2016-02-26 DIAGNOSIS — K648 Other hemorrhoids: Secondary | ICD-10-CM

## 2016-02-26 DIAGNOSIS — Z1231 Encounter for screening mammogram for malignant neoplasm of breast: Secondary | ICD-10-CM

## 2016-02-26 MED ORDER — FLUOXETINE HCL 20 MG PO CAPS: 20.0000 mg | ORAL_CAPSULE | Freq: Every day | ORAL | 3 refills | Status: DC

## 2016-02-26 NOTE — Progress Notes (Signed)
Subjective:    Patient ID: Sierra Sparks, female    DOB: 1940-08-28, 75 y.o.   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 Who did it -Enetai  Has to wear a pad for rectal bleeding occ  Is straining to have bm   utd imms   dexa 10/16-osteopenia  No falls or fracture  She takes ca and D  Also exercises   bp is stable today  No cp or palpitations or headaches or edema  No side effects to medicines  BP Readings from Last 3 Encounters:  02/26/16 136/76  02/21/16 130/72  02/03/15 140/86    No medications currently   Hx of hyperlipidemia  Lab Results  Component Value Date   CHOL 223 (H) 02/21/2016   CHOL 181 01/26/2015   CHOL 201 (H) 09/01/2013   Lab Results  Component Value Date   HDL 51.50 02/21/2016   HDL 43.10 01/26/2015   HDL 54.40 09/01/2013   Lab Results  Component Value Date   LDLCALC 154 (H) 02/21/2016   LDLCALC 115 (H) 01/26/2015   LDLCALC 132 (H) 09/01/2013   Lab Results  Component Value Date   TRIG 86.0 02/21/2016   TRIG 114.0 01/26/2015   TRIG 75.0 09/01/2013   Lab Results  Component Value Date   CHOLHDL 4 02/21/2016   CHOLHDL 4 01/26/2015   CHOLHDL 4 09/01/2013   Lab Results  Component Value Date   LDLDIRECT 143.2  08/05/2012   HDL is up  LDL went up significantly from 115 to 154 - ate baked goods/high fat items     Chemistry      Component Value Date/Time   NA 139 02/21/2016 0950   K 4.1 02/21/2016 0950   CL 103 02/21/2016 0950   CO2 31 02/21/2016 0950   BUN 29 (H) 02/21/2016 0950   CREATININE 0.72 02/21/2016 0950      Component Value Date/Time   CALCIUM 10.0 02/21/2016 0950   ALKPHOS 92 02/21/2016 0950   AST 18 02/21/2016 0950   ALT 12 02/21/2016 0950   BILITOT 0.6 02/21/2016 0950      Lab Results  Component Value Date   WBC 4.3 02/21/2016   HGB 13.1 02/21/2016   HCT 38.9 02/21/2016   MCV 91.7 02/21/2016   PLT 242.0 02/21/2016    Lab Results  Component Value Date   TSH 1.74 02/21/2016       Review of Systems    Review of Systems  Constitutional: Negative for fever, appetite change, fatigue and unexpected weight change.  Eyes: Negative for pain and visual disturbance.  ENT pos for clearing throat and cough (? Allergies) Respiratory: Negative for wheeze and shortness of breath.   Cardiovascular: Negative for cp or palpitations    Gastrointestinal: Negative for nausea, diarrhea and constipation.  Genitourinary: Negative for urgency and frequency.  Skin: Negative for pallor or rash   Neurological: Negative for weakness, light-headedness, numbness and headaches.  Hematological: Negative for adenopathy. Does not bruise/bleed easily.  Psychiatric/Behavioral: Negative for dysphoric mood. The patient is not nervous/anxious.      Objective:   Physical Exam  Constitutional: She appears well-developed and well-nourished. No distress.  Well appearing   HENT:  Head: Normocephalic and atraumatic.  Right Ear: External ear normal.  Left Ear: External ear normal.  Mouth/Throat: Oropharynx is clear and moist.  Eyes: Conjunctivae and EOM are normal. Pupils are equal, round, and reactive to light. No scleral icterus.  Neck: Normal range of motion. Neck supple. No JVD present.  Carotid bruit is not present. No thyromegaly present.  Cardiovascular: Normal rate, regular rhythm, normal heart sounds and intact distal pulses.  Exam reveals no gallop.   Pulmonary/Chest: Effort normal and breath sounds normal. No respiratory distress. She has no wheezes. She exhibits no tenderness.  Abdominal: Soft. Bowel sounds are normal. She exhibits no distension, no abdominal bruit and no mass. There is no tenderness.  Genitourinary: No breast swelling, tenderness, discharge or bleeding.  Genitourinary Comments: Breast exam: No mass, nodules, thickening, tenderness, bulging, retraction, inflamation, nipple discharge or skin changes noted.  No axillary or clavicular LA.       Musculoskeletal: Normal range of motion. She exhibits no edema or tenderness.  No kyphosis   Lymphadenopathy:    She has no cervical adenopathy.  Neurological: She is alert. She has normal reflexes. No cranial nerve deficit. She exhibits normal muscle tone. Coordination normal.  Skin: Skin is warm and dry. No rash noted. No erythema. No pallor.  Lentigines diffusely  Small papule on nose   Psychiatric: She has a normal mood and affect.          Assessment & Plan:   Problem List Items Addressed This Visit      Cardiovascular and Mediastinum   Essential hypertension - Primary    bp in fair control at this time  BP Readings from Last 1 Encounters:  02/26/16 136/76   No changes needed Disc lifstyle change with low sodium diet and exercise  Labs reviewed  Enc exercise       Internal hemorrhoids    Pt desires ref for tx of internal hemorrhoids that have been banded before        Relevant Orders   Ambulatory referral to General Surgery     Musculoskeletal and Integument   Osteopenia    dexa 10/16 Exercises and takes ca and D No falls or fx  Disc need for calcium/ vitamin D/ wt bearing exercise and bone density test every 2 y to monitor Disc safety/ fracture risk in detail          Other     HYPERCHOLESTEROLEMIA    Cholesterol is up with worse diet over the summer LDL in the 150s If unable to correct with diet will consider statin  Pt thinks she can get back on track Disc goals for lipids and reasons to control them Rev labs with pt Rev low sat fat diet in detail       Routine general medical examination at a health care facility    Reviewed health habits including diet and exercise  and skin cancer prevention Reviewed appropriate screening tests for age  Also reviewed health mt list, fam hx and immunization status , as well as social and family history   See HPI Labs reviewed  AMW reviewed  Ref done for annual mammogram   Stop up front for mammogram referral (and also surgeon for hemorrhoids)  You will be due for a recall colonoscopy in March  I recommend miralax to keep stools soft for your hemorrhoids  Cholesterol is up significantly from diet change (Avoid red meat/ fried foods/ egg yolks/ fatty breakfast meats/ butter, cheese and high fat dairy/ and shellfish)  Make sure to see your dermatologist for a skin check and protect yourself from the sun       Screening mammogram, encounter for    Scheduled annual screening mammogram Nl breast exam today  Encouraged monthly self exams        Relevant Orders   MM DIGITAL SCREENING BILATERAL

## 2016-02-26 NOTE — Assessment & Plan Note (Signed)
Scheduled annual screening mammogram Nl breast exam today  Encouraged monthly self exams   

## 2016-02-26 NOTE — Assessment & Plan Note (Signed)
Pt desires ref for tx of internal hemorrhoids that have been banded before

## 2016-02-26 NOTE — Assessment & Plan Note (Signed)
Cholesterol is up with worse diet over the summer LDL in the 150s If unable to correct with diet will consider statin  Pt thinks she can get back on track Disc goals for lipids and reasons to control them Rev labs with pt Rev low sat fat diet in detail

## 2016-02-26 NOTE — Progress Notes (Signed)
Pre visit review using our clinic review tool, if applicable. No additional management support is needed unless otherwise documented below in the visit note. 

## 2016-02-26 NOTE — Patient Instructions (Addendum)
Stop up front for mammogram referral (and also surgeon for hemorrhoids)  You will be due for a recall colonoscopy in March  I recommend miralax to keep stools soft for your hemorrhoids  Cholesterol is up significantly from diet change (Avoid red meat/ fried foods/ egg yolks/ fatty breakfast meats/ butter, cheese and high fat dairy/ and shellfish)  Make sure to see your dermatologist for a skin check and protect yourself from the sun

## 2016-02-26 NOTE — Assessment & Plan Note (Signed)
bp in fair control at this time  BP Readings from Last 1 Encounters:  02/26/16 136/76   No changes needed Disc lifstyle change with low sodium diet and exercise  Labs reviewed  Enc exercise

## 2016-02-26 NOTE — Assessment & Plan Note (Signed)
Reviewed health habits including diet and exercise and skin cancer prevention Reviewed appropriate screening tests for age  Also reviewed health mt list, fam hx and immunization status , as well as social and family history   See HPI Labs reviewed  AMW reviewed  Ref done for annual mammogram   Stop up front for mammogram referral (and also surgeon for hemorrhoids)  You will be due for a recall colonoscopy in March  I recommend miralax to keep stools soft for your hemorrhoids  Cholesterol is up significantly from diet change (Avoid red meat/ fried foods/ egg yolks/ fatty breakfast meats/ butter, cheese and high fat dairy/ and shellfish)  Make sure to see your dermatologist for a skin check and protect yourself from the sun

## 2016-02-26 NOTE — Assessment & Plan Note (Signed)
dexa 10/16 Exercises and takes ca and D No falls or fx  Disc need for calcium/ vitamin D/ wt bearing exercise and bone density test every 2 y to monitor Disc safety/ fracture risk in detail

## 2016-03-05 ENCOUNTER — Ambulatory Visit (INDEPENDENT_AMBULATORY_CARE_PROVIDER_SITE_OTHER): Payer: Medicare Other | Admitting: General Surgery

## 2016-03-05 ENCOUNTER — Encounter: Payer: Self-pay | Admitting: General Surgery

## 2016-03-05 VITALS — BP 124/78 | HR 74 | Resp 14 | Ht 61.0 in | Wt 151.0 lb

## 2016-03-05 DIAGNOSIS — K602 Anal fissure, unspecified: Secondary | ICD-10-CM

## 2016-03-05 MED ORDER — HYDROCORTISONE ACE-PRAMOXINE 2.5-1 % RE CREA: 1.0000 "application " | TOPICAL_CREAM | Freq: Three times a day (TID) | RECTAL | 0 refills | Status: DC

## 2016-03-05 NOTE — Patient Instructions (Signed)
Return as needed

## 2016-03-05 NOTE — Progress Notes (Signed)
Patient ID: Sierra Sparks, female   DOB: [birth year], 75 y.o.   MRN: [id number]  Chief Complaint  Patient presents with  . Other    hemorrhoid    HPI Sierra Sparks is a 75 y.o. female here today for a evaluation of hemorrhoids.Paytient states she had them for more than 10 years. She states when she wraps she see bright red blood on the paper never seen blood in the bowl. Sometimes painful with bowel movements, She does report straining to initiate her stools. She does report daily bowel movements.    Husband Ron is present.  HPI  Past Medical History:  Diagnosis Date  . Allergic rhinitis, cause unspecified   . Anemia   . Anxiety state, unspecified   . Colon polyps   . Depressive disorder, not elsewhere classified   . Diffuse cystic mastopathy   . Disorder of bone and cartilage, unspecified   . Diverticulosis   . External hemorrhoids   . Osteoarthrosis, unspecified whether generalized or localized, unspecified site    hands  . Osteoporosis   . Pure hypercholesterolemia   . Unspecified essential hypertension   . Unspecified hemorrhoids without mention of complication     Past Surgical History:  Procedure Laterality Date  . BUNIONECTOMY  7/01   left   . CESAREAN SECTION  1981  . CHOLECYSTECTOMY    . COLONOSCOPY  10/03   polyp; hemorrhoids;diverticulosis  . COLONOSCOPY  2013   diverticulosis  . DEXA  3/02   osteopenia  . DEXA  1/05   Stable  . HAND SURGERY     DIP fusion-left  . HEMORRHOID SURGERY    . TONSILLECTOMY     childhood    Family History  Problem Relation Age of Onset  . Colon cancer Brother     ? primary colon  . Arthritis Father   . Lung cancer Father     + smoker  . Stroke Mother   . Coronary artery disease Mother 8  . Stroke Maternal Grandmother     hemorrhagic  . Uterine cancer Maternal Grandmother   . Bone cancer Paternal Uncle   . Liver cancer Brother   . Esophageal cancer Neg Hx   . Rectal cancer Neg Hx   . Stomach cancer Neg  Hx   . Breast cancer Neg Hx     Social History Social History  Substance Use Topics  . Smoking status: Never Smoker  . Smokeless tobacco: Never Used  . Alcohol use 0.0 oz/week     Comment: Occasional-wine    No Known Allergies  Current Outpatient Prescriptions  Medication Sig Dispense Refill  . Azelastine HCl 0.15 % SOLN Place 2 sprays into the nose 2 (two) times daily. (Patient taking differently: Place 2 sprays into the nose as needed. ) 30 mL 11  . Calcium Carbonate-Vit D-Min (CALCIUM 1200) 1200-1000 MG-UNIT CHEW Chew 1 tablet by mouth 2 (two) times daily.     . Cetirizine HCl 10 MG CAPS Take 1 capsule by mouth daily.    Marland Kitchen FLUoxetine (PROZAC) 20 MG capsule Take 1 capsule (20 mg total) by mouth daily. 90 capsule 3  . KRILL OIL PO Take 1 capsule by mouth daily.    . Multiple Vitamin (MULTIVITAMIN) tablet Take 1 tablet by mouth daily.    . naproxen (NAPROSYN) 500 MG tablet Take 250 mg by mouth 2 (two) times daily with a meal.     . hydrocortisone-pramoxine (ANALPRAM HC) 2.5-1 % rectal cream Place 1 application  rectally 3 (three) times daily. 30 g 0   No current facility-administered medications for this visit.     Review of Systems Review of Systems  Constitutional: Negative.   Respiratory: Negative.   Cardiovascular: Negative.     Blood pressure 124/78, pulse 74, resp. rate 14, height 5\' 1"  (1.549 m), weight 151 lb (68.5 kg).  Physical Exam Physical Exam  Constitutional: She is oriented to person, place, and time. She appears well-developed and well-nourished.  Eyes: Conjunctivae are normal. No scleral icterus.  Neck: Neck supple.  Cardiovascular: Normal rate, regular rhythm and normal heart sounds.   Pulmonary/Chest: Effort normal and breath sounds normal.  Abdominal: Soft. Bowel sounds are normal. There is no tenderness.  Genitourinary: Rectal exam shows internal hemorrhoid and fissure.     Lymphadenopathy:    She has no cervical adenopathy.  Neurological: She  is alert and oriented to person, place, and time.  Skin: Skin is warm and dry.    Data Reviewed Review of the 07/11/2011 colonoscopy completed in [place] was notable for a diverticular-like area in the lower rectum, likely related to her staple hemorrhoidectomy completed in 2005. Pathology of that specimen had shown near full-thickness muscularis propria resection, and the resulting defect on her 2013 colonoscopy related to this procedure. No polyps reported.  The patient had a tubular adenoma by report in 2007 and would be a candidate for repeat examination in 2018.  Assessment    Rectal bleeding likely secondary to posterior anal fissure.    Plan    With the short duration of symptoms I think she'll respond to conservative measures including Analpram cream applied directly to the area to-3 times per day.  I think she may find that using glycerin suppositories when she first has the urge to defecate will accelerate and improve her elimination and minimize straining.  A fiber supplement to help minimize hard stools has been encouraged.  The patient was encouraged to send a message in 2 weeks to report if her symptoms have improved.     Patient to return as needed. Rx sent pharmacy. The patient will be encouraged to make use of a daily fiber supplement.   This information has been scribed by Gaspar Cola CMA.  Robert Bellow 03/06/2016, 6:14 AM

## 2016-03-06 DIAGNOSIS — K602 Anal fissure, unspecified: Secondary | ICD-10-CM | POA: Insufficient documentation

## 2016-04-01 ENCOUNTER — Encounter: Payer: Self-pay | Admitting: General Surgery

## 2016-04-02 ENCOUNTER — Encounter: Payer: Self-pay | Admitting: General Surgery

## 2016-04-12 ENCOUNTER — Ambulatory Visit
Admission: RE | Admit: 2016-04-12 | Discharge: 2016-04-12 | Disposition: A | Discharge status: Home / Self Care | Payer: Medicare Other | Source: Ambulatory Visit | Attending: Family Medicine | Admitting: Family Medicine

## 2016-04-12 DIAGNOSIS — Z1231 Encounter for screening mammogram for malignant neoplasm of breast: Secondary | ICD-10-CM | POA: Diagnosis not present

## 2016-04-18 ENCOUNTER — Other Ambulatory Visit: Payer: Self-pay | Admitting: Family Medicine

## 2016-04-18 DIAGNOSIS — N6489 Other specified disorders of breast: Secondary | ICD-10-CM

## 2016-04-18 DIAGNOSIS — R928 Other abnormal and inconclusive findings on diagnostic imaging of breast: Secondary | ICD-10-CM

## 2016-04-25 ENCOUNTER — Ambulatory Visit
Admission: RE | Admit: 2016-04-25 | Discharge: 2016-04-25 | Disposition: A | Discharge status: Home / Self Care | Payer: Medicare Other | Source: Ambulatory Visit | Attending: Family Medicine | Admitting: Family Medicine

## 2016-04-25 DIAGNOSIS — N6489 Other specified disorders of breast: Secondary | ICD-10-CM | POA: Insufficient documentation

## 2016-04-25 DIAGNOSIS — R928 Other abnormal and inconclusive findings on diagnostic imaging of breast: Secondary | ICD-10-CM

## 2016-05-20 ENCOUNTER — Encounter: Payer: Self-pay | Admitting: Internal Medicine

## 2016-06-21 ENCOUNTER — Telehealth: Payer: Self-pay

## 2016-06-21 NOTE — Telephone Encounter (Signed)
Pt left note at front desk requesting a cologuard kit. Pt request cb.

## 2016-06-21 NOTE — Telephone Encounter (Signed)
Please sign her up for the cologuard program on Monday, thanks

## 2016-06-25 NOTE — Telephone Encounter (Signed)
Form faxed

## 2016-06-25 NOTE — Telephone Encounter (Signed)
Form filled out and placed in your inbox to sign before I can fax it to them

## 2016-06-25 NOTE — Telephone Encounter (Signed)
Done and in IN box 

## 2016-07-16 LAB — COLOGUARD

## 2016-08-16 ENCOUNTER — Ambulatory Visit (INDEPENDENT_AMBULATORY_CARE_PROVIDER_SITE_OTHER): Payer: Medicare Other | Admitting: Family Medicine

## 2016-08-16 ENCOUNTER — Encounter: Payer: Self-pay | Admitting: Family Medicine

## 2016-08-16 VITALS — BP 130/82 | HR 53 | Temp 98.3°F | Ht 61.0 in | Wt 157.5 lb

## 2016-08-16 DIAGNOSIS — I10 Essential (primary) hypertension: Secondary | ICD-10-CM | POA: Diagnosis not present

## 2016-08-16 DIAGNOSIS — H6981 Other specified disorders of Eustachian tube, right ear: Secondary | ICD-10-CM | POA: Diagnosis not present

## 2016-08-16 DIAGNOSIS — H6980 Other specified disorders of Eustachian tube, unspecified ear: Secondary | ICD-10-CM | POA: Insufficient documentation

## 2016-08-16 MED ORDER — FLUTICASONE PROPIONATE 50 MCG/ACT NA SUSP: 2.0000 | Freq: Every day | NASAL | 6 refills | Status: DC

## 2016-08-16 NOTE — Patient Instructions (Signed)
I think you have eustacian tube dysfunction ( fluid in ear tube)  Common from allergies  Continue claritin Start flonase 2 sprays each nostril once though the allergy season   If new symptoms - update  Update if not starting to improve in a week or if worsening    Avoid allergens when you can

## 2016-08-16 NOTE — Assessment & Plan Note (Signed)
R side  Suspect due to allergies tx with flonase  Continue claritin  Allergen avoidance  Update if not starting to improve in a week or if worsening  Consider prednisone if no imp

## 2016-08-16 NOTE — Progress Notes (Signed)
Subjective:    Patient ID: Sierra Sparks, female    DOB: 06/09/1940, 76 y.o.   MRN: [id number]  HPI 76 yo female presents with R ear fullness   Started on Tuesday  Out of nowhere could not hear well  Felt like she was in a high altitude Sounds like " a convention of butterflies" - low level noise    Sound is "tinny"  No pain  Feels full  No drainage  No itching   No sinus congestion  Some pnd from allergies / constant with cough and clearing throat  Teary itchy eyes   She started taking claritin redi tab (helps her eye)  Not using astelin nasal spray    bp is elevated mildly today- just came out of the gym BP Readings from Last 3 Encounters:  08/16/16 (!) 142/86  03/05/16 124/78  02/26/16 136/76   Wt Readings from Last 3 Encounters:  08/16/16 157 lb 8 oz (71.4 kg)  03/05/16 151 lb (68.5 kg)  02/26/16 154 lb 4 oz (70 kg)    Patient Active Problem List   Diagnosis Date Noted  . ETD (eustachian tube dysfunction) 08/16/2016  . Anal fissure 03/06/2016  . Screening mammogram, encounter for 02/26/2016  . Internal hemorrhoids 02/26/2016  . Routine general medical examination at a health care facility 02/03/2015  . Estrogen deficiency 02/03/2015  . Encounter for Medicare annual wellness exam 08/12/2012  . Glaucoma 08/12/2012  . Other screening mammogram 05/15/2011  . History of colon polyps 05/15/2011  . ALLERGIC RHINITIS 03/02/2008  . Osteopenia 01/05/2007  . HYPERCHOLESTEROLEMIA 10/24/2006  . ANXIETY 10/24/2006  . DEPRESSION 10/24/2006  . Essential hypertension 10/24/2006  . HEMORRHOIDS 10/24/2006  . FIBROCYSTIC BREAST DISEASE 10/24/2006  . OSTEOARTHRITIS 10/24/2006   Past Medical History:  Diagnosis Date  . Allergic rhinitis, cause unspecified   . Anemia   . Anxiety state, unspecified   . Colon polyps   . Depressive disorder, not elsewhere classified   . Diffuse cystic mastopathy   . Disorder of bone and cartilage, unspecified   . Diverticulosis     . External hemorrhoids   . Osteoarthrosis, unspecified whether generalized or localized, unspecified site    hands  . Osteoporosis   . Pure hypercholesterolemia   . Unspecified essential hypertension   . Unspecified hemorrhoids without mention of complication    Past Surgical History:  Procedure Laterality Date  . BUNIONECTOMY  7/01   left   . CESAREAN SECTION  1981  . CHOLECYSTECTOMY    . COLONOSCOPY  10/03   polyp; hemorrhoids;diverticulosis  . COLONOSCOPY  2013   diverticulosis  . DEXA  3/02   osteopenia  . DEXA  1/05   Stable  . HAND SURGERY     DIP fusion-left  . HEMORRHOID SURGERY    . TONSILLECTOMY     childhood   Social History  Substance Use Topics  . Smoking status: Never Smoker  . Smokeless tobacco: Never Used  . Alcohol use 0.0 oz/week     Comment: Occasional-wine   Family History  Problem Relation Age of Onset  . Colon cancer Brother     ? primary colon  . Arthritis Father   . Lung cancer Father     + smoker  . Stroke Mother   . Coronary artery disease Mother 51  . Stroke Maternal Grandmother     hemorrhagic  . Uterine cancer Maternal Grandmother   . Bone cancer Paternal Uncle   . Liver cancer Brother   .  Esophageal cancer Neg Hx   . Rectal cancer Neg Hx   . Stomach cancer Neg Hx   . Breast cancer Neg Hx    No Known Allergies Current Outpatient Prescriptions on File Prior to Visit  Medication Sig Dispense Refill  . Azelastine HCl 0.15 % SOLN Place 2 sprays into the nose 2 (two) times daily. (Patient taking differently: Place 2 sprays into the nose as needed. ) 30 mL 11  . Calcium Carbonate-Vit D-Min (CALCIUM 1200) 1200-1000 MG-UNIT CHEW Chew 1 tablet by mouth 2 (two) times daily.     . Cetirizine HCl 10 MG CAPS Take 1 capsule by mouth daily.    Marland Kitchen FLUoxetine (PROZAC) 20 MG capsule Take 1 capsule (20 mg total) by mouth daily. 90 capsule 3  . hydrocortisone-pramoxine (ANALPRAM HC) 2.5-1 % rectal cream Place 1 application rectally 3 (three)  times daily. 30 g 0  . KRILL OIL PO Take 1 capsule by mouth daily.    . Multiple Vitamin (MULTIVITAMIN) tablet Take 1 tablet by mouth daily.    . naproxen (NAPROSYN) 500 MG tablet Take 250 mg by mouth 2 (two) times daily with a meal.      No current facility-administered medications on file prior to visit.     Review of Systems Review of Systems  Constitutional: Negative for fever, appetite change, fatigue and unexpected weight change.  Eyes: Negative for pain and visual disturbance.  ENT pos for R ear fullness with dec hearing/ also pnd and occ sneezing/cough Respiratory: Negative for wheeze and shortness of breath.   Cardiovascular: Negative for cp or palpitations    Gastrointestinal: Negative for nausea, diarrhea and constipation.  Genitourinary: Negative for urgency and frequency.  Skin: Negative for pallor or rash   Neurological: Negative for weakness, light-headedness, numbness and headaches.  Hematological: Negative for adenopathy. Does not bruise/bleed easily.  Psychiatric/Behavioral: Negative for dysphoric mood. The patient is not nervous/anxious.         Objective:   Physical Exam  Constitutional: She appears well-developed and well-nourished. No distress.  HENT:  Head: Normocephalic and atraumatic.  Left Ear: External ear normal.  Mouth/Throat: Oropharynx is clear and moist.  Nares are boggy Mild clear pnd R TM is dull/ very small eff L TM nl app Throat clear  No sinus tenderness  Eyes: Conjunctivae and EOM are normal. Pupils are equal, round, and reactive to light. Right eye exhibits no discharge. Left eye exhibits no discharge.  Neck: Normal range of motion. Neck supple.  Cardiovascular: Normal rate and regular rhythm.   Pulmonary/Chest: Effort normal and breath sounds normal. No respiratory distress. She has no wheezes. She has no rales.  Lymphadenopathy:    She has no cervical adenopathy.  Neurological: She is alert. No cranial nerve deficit.  Skin: Skin is  warm and dry. No rash noted. No erythema.  Psychiatric: She has a normal mood and affect.          Assessment & Plan:   Problem List Items Addressed This Visit      Cardiovascular and Mediastinum   Essential hypertension    Better on 2nd check (just came from exercise) BP: 130/82          Nervous and Auditory   ETD (eustachian tube dysfunction)    R side  Suspect due to allergies tx with flonase  Continue claritin  Allergen avoidance  Update if not starting to improve in a week or if worsening  Consider prednisone if no imp

## 2016-08-16 NOTE — Assessment & Plan Note (Signed)
Better on 2nd check (just came from exercise) BP: 130/82

## 2016-08-16 NOTE — Progress Notes (Signed)
Pre visit review using our clinic review tool, if applicable. No additional management support is needed unless otherwise documented below in the visit note. 

## 2016-08-30 ENCOUNTER — Encounter: Payer: Self-pay | Admitting: Family Medicine

## 2016-08-30 ENCOUNTER — Ambulatory Visit (INDEPENDENT_AMBULATORY_CARE_PROVIDER_SITE_OTHER): Payer: Medicare Other | Admitting: Family Medicine

## 2016-08-30 VITALS — BP 144/82 | HR 69 | Temp 98.2°F | Ht 61.0 in | Wt 157.5 lb

## 2016-08-30 DIAGNOSIS — H9191 Unspecified hearing loss, right ear: Secondary | ICD-10-CM | POA: Diagnosis not present

## 2016-08-30 DIAGNOSIS — H9201 Otalgia, right ear: Secondary | ICD-10-CM

## 2016-08-30 NOTE — Assessment & Plan Note (Signed)
Pressure/not pain  Nl ePt c/o reduced hearing with non pulsatile tinnitus - also with a full feeling in ear Prev ETD looks to be resoved Will ref to ENT xam

## 2016-08-30 NOTE — Assessment & Plan Note (Signed)
Pt c/o reduced hearing with non pulsatile tinnitus - also with a full feeling in ear Prev ETD looks to be resoved Will ref to ENT

## 2016-08-30 NOTE — Progress Notes (Signed)
Subjective:    Patient ID: Sierra Sparks, female    DOB: 11/26/1940, 76 y.o.   MRN: [id number]  HPI Here for R ear discomfort  No pain/just fullness and tinnitus (not pulsitile)  No other symptoms  No fever or facial pain or headache  No past ear injury   claritin flonase No improvement at all   BP Readings from Last 3 Encounters:  08/30/16 (!) 144/82  08/16/16 130/82  03/05/16 124/78   Patient Active Problem List   Diagnosis Date Noted  . Hearing loss in right ear 08/30/2016  . Otalgia of right ear 08/30/2016  . ETD (eustachian tube dysfunction) 08/16/2016  . Anal fissure 03/06/2016  . Screening mammogram, encounter for 02/26/2016  . Internal hemorrhoids 02/26/2016  . Routine general medical examination at a health care facility 02/03/2015  . Estrogen deficiency 02/03/2015  . Encounter for Medicare annual wellness exam 08/12/2012  . Glaucoma 08/12/2012  . Other screening mammogram 05/15/2011  . History of colon polyps 05/15/2011  . ALLERGIC RHINITIS 03/02/2008  . Osteopenia 01/05/2007  . HYPERCHOLESTEROLEMIA 10/24/2006  . ANXIETY 10/24/2006  . DEPRESSION 10/24/2006  . Essential hypertension 10/24/2006  . HEMORRHOIDS 10/24/2006  . FIBROCYSTIC BREAST DISEASE 10/24/2006  . OSTEOARTHRITIS 10/24/2006   Past Medical History:  Diagnosis Date  . Allergic rhinitis, cause unspecified   . Anemia   . Anxiety state, unspecified   . Colon polyps   . Depressive disorder, not elsewhere classified   . Diffuse cystic mastopathy   . Disorder of bone and cartilage, unspecified   . Diverticulosis   . External hemorrhoids   . Osteoarthrosis, unspecified whether generalized or localized, unspecified site    hands  . Osteoporosis   . Pure hypercholesterolemia   . Unspecified essential hypertension   . Unspecified hemorrhoids without mention of complication    Past Surgical History:  Procedure Laterality Date  . BUNIONECTOMY  7/01   left   . CESAREAN SECTION  1981  .  CHOLECYSTECTOMY    . COLONOSCOPY  10/03   polyp; hemorrhoids;diverticulosis  . COLONOSCOPY  2013   diverticulosis  . DEXA  3/02   osteopenia  . DEXA  1/05   Stable  . HAND SURGERY     DIP fusion-left  . HEMORRHOID SURGERY    . TONSILLECTOMY     childhood   Social History  Substance Use Topics  . Smoking status: Never Smoker  . Smokeless tobacco: Never Used  . Alcohol use 0.0 oz/week     Comment: Occasional-wine   Family History  Problem Relation Age of Onset  . Colon cancer Brother        ? primary colon  . Arthritis Father   . Lung cancer Father        + smoker  . Stroke Mother   . Coronary artery disease Mother 34  . Stroke Maternal Grandmother        hemorrhagic  . Uterine cancer Maternal Grandmother   . Bone cancer Paternal Uncle   . Liver cancer Brother   . Esophageal cancer Neg Hx   . Rectal cancer Neg Hx   . Stomach cancer Neg Hx   . Breast cancer Neg Hx    No Known Allergies Current Outpatient Prescriptions on File Prior to Visit  Medication Sig Dispense Refill  . Azelastine HCl 0.15 % SOLN Place 2 sprays into the nose 2 (two) times daily. (Patient taking differently: Place 2 sprays into the nose as needed. ) 30 mL 11  .  Calcium Carbonate-Vit D-Min (CALCIUM 1200) 1200-1000 MG-UNIT CHEW Chew 1 tablet by mouth 2 (two) times daily.     . Cetirizine HCl 10 MG CAPS Take 1 capsule by mouth daily.    Marland Kitchen FLUoxetine (PROZAC) 20 MG capsule Take 1 capsule (20 mg total) by mouth daily. 90 capsule 3  . fluticasone (FLONASE) 50 MCG/ACT nasal spray Place 2 sprays into both nostrils daily. 16 g 6  . hydrocortisone-pramoxine (ANALPRAM HC) 2.5-1 % rectal cream Place 1 application rectally 3 (three) times daily. 30 g 0  . KRILL OIL PO Take 1 capsule by mouth daily.    . Multiple Vitamin (MULTIVITAMIN) tablet Take 1 tablet by mouth daily.    . naproxen (NAPROSYN) 500 MG tablet Take 250 mg by mouth 2 (two) times daily with a meal.      No current facility-administered  medications on file prior to visit.     Review of Systems    Review of Systems  Constitutional: Negative for fever, appetite change, fatigue and unexpected weight change.  ENT pos for R ear pressure and tinnitus and red hearing / neg for pain  Neg for congestion/sinus pain or ST Eyes: Negative for pain and visual disturbance.  Respiratory: Negative for cough and shortness of breath.   Cardiovascular: Negative for cp or palpitations    Gastrointestinal: Negative for nausea, diarrhea and constipation.  Genitourinary: Negative for urgency and frequency.  Skin: Negative for pallor or rash   Neurological: Negative for weakness, light-headedness, numbness and headaches.  Hematological: Negative for adenopathy. Does not bruise/bleed easily.  Psychiatric/Behavioral: Negative for dysphoric mood. The patient is not nervous/anxious.      Objective:   Physical Exam  Constitutional: She appears well-developed and well-nourished. No distress.  Well appearing   HENT:  Head: Normocephalic and atraumatic.  Right Ear: Tympanic membrane, external ear and ear canal normal. No drainage, swelling or tenderness. No foreign bodies. No mastoid tenderness. Tympanic membrane is not scarred, not erythematous, not retracted and not bulging. Tympanic membrane mobility is normal. No middle ear effusion. Decreased hearing is noted.  Left Ear: Tympanic membrane, external ear and ear canal normal.  Nose: Nose normal.  Mouth/Throat: Oropharynx is clear and moist.  Neurological: She is alert. No cranial nerve deficit.  Skin: Skin is warm and dry. No rash noted. No erythema.  Psychiatric: She has a normal mood and affect.          Assessment & Plan:   Problem List Items Addressed This Visit      Nervous and Auditory   Hearing loss in right ear    Pt c/o reduced hearing with non pulsatile tinnitus - also with a full feeling in ear Prev ETD looks to be resoved Will ref to ENT       Relevant Orders    Ambulatory referral to ENT     Other   Otalgia of right ear    Pressure/not pain  Nl ePt c/o reduced hearing with non pulsatile tinnitus - also with a full feeling in ear Prev ETD looks to be resoved Will ref to ENT xam       Relevant Orders   Ambulatory referral to ENT

## 2016-08-30 NOTE — Patient Instructions (Signed)
Your ear looks ok today For ongoing symptoms - let's get you to ENT for an evaluation  We will do a referral   If you develop pain or other symptoms please let me know

## 2016-10-22 ENCOUNTER — Other Ambulatory Visit: Payer: Self-pay | Admitting: Otolaryngology

## 2016-10-22 DIAGNOSIS — H903 Sensorineural hearing loss, bilateral: Secondary | ICD-10-CM

## 2016-10-22 DIAGNOSIS — H905 Unspecified sensorineural hearing loss: Secondary | ICD-10-CM

## 2016-10-31 ENCOUNTER — Ambulatory Visit
Admission: RE | Admit: 2016-10-31 | Discharge: 2016-10-31 | Disposition: A | Discharge status: Home / Self Care | Payer: Medicare Other | Source: Ambulatory Visit | Attending: Otolaryngology | Admitting: Otolaryngology

## 2016-10-31 DIAGNOSIS — H905 Unspecified sensorineural hearing loss: Secondary | ICD-10-CM

## 2016-10-31 DIAGNOSIS — H903 Sensorineural hearing loss, bilateral: Secondary | ICD-10-CM

## 2016-10-31 DIAGNOSIS — R221 Localized swelling, mass and lump, neck: Secondary | ICD-10-CM | POA: Insufficient documentation

## 2016-10-31 LAB — POCT I-STAT CREATININE: CREATININE: 1 mg/dL (ref 0.44–1.00)

## 2016-10-31 MED ORDER — GADOBENATE DIMEGLUMINE 529 MG/ML IV SOLN
15.0000 mL | Freq: Once | INTRAVENOUS | Status: AC | PRN
  Administered 2016-10-31: 15 mL via INTRAVENOUS

## 2016-11-18 ENCOUNTER — Ambulatory Visit (INDEPENDENT_AMBULATORY_CARE_PROVIDER_SITE_OTHER): Payer: Medicare Other | Admitting: Family Medicine

## 2016-11-18 ENCOUNTER — Encounter: Payer: Self-pay | Admitting: Family Medicine

## 2016-11-18 VITALS — BP 138/75 | HR 82 | Temp 98.2°F | Resp 16 | Ht 61.0 in | Wt 161.8 lb

## 2016-11-18 DIAGNOSIS — B349 Viral infection, unspecified: Secondary | ICD-10-CM | POA: Diagnosis not present

## 2016-11-18 NOTE — Progress Notes (Signed)
Subjective:    Patient ID: Sierra Sparks, female    DOB: [birth year], 76 y.o.   MRN: [id number]  HPI Here for flu like symptoms that are now better   [place]ent [place] the beach with [place] group [place] 4 -several people got sick as well as a baby  Husband got uri and he got better   Prod cough Headache  Swollen/painful glands  Sweats / chills-did not have a fever however   Some nausea and diarrhea No vomiting  Black stool- better now (took pepto and that was the cause)   Symptoms lingered for about 3 weeks -concerned her  Found out one of her trip mates had mono   Felt better this sat am  Energy is coming back  Went to the gym today    Last colonoscopy 2013  Patient Active Problem List   Diagnosis Date Noted  . Viral syndrome 11/18/2016  . Hearing loss in right ear 08/30/2016  . Otalgia of right ear 08/30/2016  . ETD (eustachian tube dysfunction) 08/16/2016  . Anal fissure 03/06/2016  . Screening mammogram, encounter for 02/26/2016  . Internal hemorrhoids 02/26/2016  . Routine general medical examination at a health care facility 02/03/2015  . Estrogen deficiency 02/03/2015  . Encounter for Medicare annual wellness exam 08/12/2012  . Glaucoma 08/12/2012  . Other screening mammogram 05/15/2011  . History of colon polyps 05/15/2011  . ALLERGIC RHINITIS 03/02/2008  . Osteopenia 01/05/2007  . HYPERCHOLESTEROLEMIA 10/24/2006  . ANXIETY 10/24/2006  . DEPRESSION 10/24/2006  . Essential hypertension 10/24/2006  . HEMORRHOIDS 10/24/2006  . FIBROCYSTIC BREAST DISEASE 10/24/2006  . OSTEOARTHRITIS 10/24/2006   Past Medical History:  Diagnosis Date  . Allergic rhinitis, cause unspecified   . Anemia   . Anxiety state, unspecified   . Colon polyps   . Depressive disorder, not elsewhere classified   . Diffuse cystic mastopathy   . Disorder of bone and cartilage, unspecified   . Diverticulosis   . External hemorrhoids   . Osteoarthrosis, unspecified whether generalized or  localized, unspecified site    hands  . Osteoporosis   . Pure hypercholesterolemia   . Unspecified essential hypertension   . Unspecified hemorrhoids without mention of complication    Past Surgical History:  Procedure Laterality Date  . BUNIONECTOMY  7/01   left   . CESAREAN SECTION  1981  . CHOLECYSTECTOMY    . COLONOSCOPY  10/03   polyp; hemorrhoids;diverticulosis  . COLONOSCOPY  2013   diverticulosis  . DEXA  3/02   osteopenia  . DEXA  1/05   Stable  . HAND SURGERY     DIP fusion-left  . HEMORRHOID SURGERY    . TONSILLECTOMY     childhood   Social History  Substance Use Topics  . Smoking status: Never Smoker  . Smokeless tobacco: Never Used  . Alcohol use 0.0 oz/week     Comment: Occasional-wine   Family History  Problem Relation Age of Onset  . Colon cancer Brother        ? primary colon  . Arthritis Father   . Lung cancer Father        + smoker  . Stroke Mother   . Coronary artery disease Mother 72  . Stroke Maternal Grandmother        hemorrhagic  . Uterine cancer Maternal Grandmother   . Bone cancer Paternal Uncle   . Liver cancer Brother   . Esophageal cancer Neg Hx   . Rectal cancer Neg Hx   .  Stomach cancer Neg Hx   . Breast cancer Neg Hx    No Known Allergies Current Outpatient Prescriptions on File Prior to Visit  Medication Sig Dispense Refill  . Calcium Carbonate-Vit D-Min (CALCIUM 1200) 1200-1000 MG-UNIT CHEW Chew 1 tablet by mouth 2 (two) times daily.     . Cetirizine HCl 10 MG CAPS Take 1 capsule by mouth daily.    Marland Kitchen FLUoxetine (PROZAC) 20 MG capsule Take 1 capsule (20 mg total) by mouth daily. 90 capsule 3  . fluticasone (FLONASE) 50 MCG/ACT nasal spray Place 2 sprays into both nostrils daily. 16 g 6  . hydrocortisone-pramoxine (ANALPRAM HC) 2.5-1 % rectal cream Place 1 application rectally 3 (three) times daily. 30 g 0  . KRILL OIL PO Take 1 capsule by mouth daily.    . Multiple Vitamin (MULTIVITAMIN) tablet Take 1 tablet by mouth  daily.    . naproxen (NAPROSYN) 500 MG tablet Take 250 mg by mouth 2 (two) times daily with a meal.      No current facility-administered medications on file prior to visit.     Review of Systems    Review of Systems  Constitutional: Negative for fever, appetite change, fatigue and unexpected weight change.  Eyes: Negative for pain and visual disturbance.  Respiratory: Negative for cough and shortness of breath.   Cardiovascular: Negative for cp or palpitations    Gastrointestinal: Negative for nausea, diarrhea and constipation.  Genitourinary: Negative for urgency and frequency.  Skin: Negative for pallor or rash   Neurological: Negative for weakness, light-headedness, numbness and headaches.  Hematological: Negative for adenopathy. Does not bruise/bleed easily.  Psychiatric/Behavioral: Negative for dysphoric mood. The patient is not nervous/anxious.      Objective:   Physical Exam  Constitutional: She appears well-developed and well-nourished. No distress.  HENT:  Head: Normocephalic and atraumatic.  Right Ear: External ear normal.  Left Ear: External ear normal.  Nose: Nose normal.  Mouth/Throat: Oropharynx is clear and moist.  Nares are boggy No sinus tenderness  Eyes: Pupils are equal, round, and reactive to light. Conjunctivae and EOM are normal. Right eye exhibits no discharge. Left eye exhibits no discharge. No scleral icterus.  Neck: Normal range of motion. Neck supple. No JVD present. Carotid bruit is not present. No thyromegaly present.  Cardiovascular: Normal rate, regular rhythm, normal heart sounds and intact distal pulses.  Exam reveals no gallop.   Pulmonary/Chest: Effort normal and breath sounds normal. No respiratory distress. She has no wheezes. She has no rales.  Abdominal: Soft. Bowel sounds are normal. She exhibits no distension and no mass. There is no tenderness.  Musculoskeletal: She exhibits no edema or tenderness.  Lymphadenopathy:    She has no  cervical adenopathy.  Neurological: She is alert. She has normal reflexes. No cranial nerve deficit. She exhibits normal muscle tone. Coordination normal.  Skin: Skin is warm and dry. No rash noted. No erythema. No pallor.  Psychiatric: She has a normal mood and affect.          Assessment & Plan:   Problem List Items Addressed This Visit      Other   Viral syndrome    Weeks ago-now resolved since sat  Had resp and GI symptoms  Dark stool was from pepto bismol  Re assuring exam  F/u if symptoms return  She will return to usual activities gradually

## 2016-11-18 NOTE — Patient Instructions (Signed)
I'm glad you are feeling better  Get back to regular habits gradually  Stay hydrated Alert [place] if symptoms return

## 2016-11-18 NOTE — Assessment & Plan Note (Signed)
Weeks ago-now resolved since sat  Had resp and GI symptoms  Dark stool was from pepto bismol  Re assuring exam  F/u if symptoms return  She will return to usual activities gradually

## 2017-02-25 ENCOUNTER — Ambulatory Visit (INDEPENDENT_AMBULATORY_CARE_PROVIDER_SITE_OTHER): Payer: Medicare Other

## 2017-02-25 DIAGNOSIS — Z23 Encounter for immunization: Secondary | ICD-10-CM | POA: Diagnosis not present

## 2017-03-16 ENCOUNTER — Other Ambulatory Visit: Payer: Self-pay | Admitting: Family Medicine

## 2017-03-24 ENCOUNTER — Ambulatory Visit: Payer: Medicare Other

## 2017-03-31 ENCOUNTER — Encounter: Payer: Self-pay | Admitting: Internal Medicine

## 2017-03-31 ENCOUNTER — Encounter: Payer: Self-pay | Admitting: Family Medicine

## 2017-03-31 ENCOUNTER — Ambulatory Visit (INDEPENDENT_AMBULATORY_CARE_PROVIDER_SITE_OTHER): Payer: Medicare Other | Admitting: Family Medicine

## 2017-03-31 VITALS — BP 148/90 | HR 74 | Temp 98.0°F | Ht 61.5 in | Wt 166.2 lb

## 2017-03-31 DIAGNOSIS — Z0000 Encounter for general adult medical examination without abnormal findings: Secondary | ICD-10-CM | POA: Diagnosis not present

## 2017-03-31 DIAGNOSIS — Z860100 Personal history of colon polyps, unspecified: Secondary | ICD-10-CM

## 2017-03-31 DIAGNOSIS — M8589 Other specified disorders of bone density and structure, multiple sites: Secondary | ICD-10-CM | POA: Diagnosis not present

## 2017-03-31 DIAGNOSIS — Z1231 Encounter for screening mammogram for malignant neoplasm of breast: Secondary | ICD-10-CM

## 2017-03-31 DIAGNOSIS — I10 Essential (primary) hypertension: Secondary | ICD-10-CM | POA: Diagnosis not present

## 2017-03-31 DIAGNOSIS — E7800 Pure hypercholesterolemia, unspecified: Secondary | ICD-10-CM | POA: Diagnosis not present

## 2017-03-31 LAB — CBC WITH DIFFERENTIAL/PLATELET
BASOS PCT: 0.4 % (ref 0.0–3.0)
Basophils Absolute: 0 10*3/uL (ref 0.0–0.1)
EOS ABS: 0.1 10*3/uL (ref 0.0–0.7)
EOS PCT: 1 % (ref 0.0–5.0)
HCT: 37.6 % (ref 36.0–46.0)
Hemoglobin: 12.4 g/dL (ref 12.0–15.0)
LYMPHS ABS: 2.4 10*3/uL (ref 0.7–4.0)
Lymphocytes Relative: 42.6 % (ref 12.0–46.0)
MCHC: 32.8 g/dL (ref 30.0–36.0)
MCV: 92.2 fl (ref 78.0–100.0)
MONO ABS: 0.7 10*3/uL (ref 0.1–1.0)
Monocytes Relative: 12.5 % — ABNORMAL HIGH (ref 3.0–12.0)
NEUTROS PCT: 43.5 % (ref 43.0–77.0)
Neutro Abs: 2.4 10*3/uL (ref 1.4–7.7)
PLATELETS: 264 10*3/uL (ref 150.0–400.0)
RBC: 4.08 Mil/uL (ref 3.87–5.11)
RDW: 14.2 % (ref 11.5–15.5)
WBC: 5.6 10*3/uL (ref 4.0–10.5)

## 2017-03-31 LAB — LIPID PANEL
Cholesterol: 224 mg/dL — ABNORMAL HIGH (ref 0–200)
HDL: 49.9 mg/dL (ref >39.00)
LDL CALC: 138 mg/dL — AB (ref 0–99)
NONHDL: 174.56
Total CHOL/HDL Ratio: 4
Triglycerides: 182 mg/dL — ABNORMAL HIGH (ref 0.0–149.0)
VLDL: 36.4 mg/dL (ref 0.0–40.0)

## 2017-03-31 LAB — COMPREHENSIVE METABOLIC PANEL
ALBUMIN: 4 g/dL (ref 3.5–5.2)
ALT: 14 U/L (ref 0–35)
AST: 18 U/L (ref 0–37)
Alkaline Phosphatase: 95 U/L (ref 39–117)
BUN: 21 mg/dL (ref 6–23)
CHLORIDE: 101 meq/L (ref 96–112)
CO2: 30 meq/L (ref 19–32)
Calcium: 9.8 mg/dL (ref 8.4–10.5)
Creatinine, Ser: 0.95 mg/dL (ref 0.40–1.20)
GFR: 61.07 mL/min (ref >60.00)
GLUCOSE: 118 mg/dL — AB (ref 70–99)
POTASSIUM: 3.4 meq/L — AB (ref 3.5–5.1)
SODIUM: 136 meq/L (ref 135–145)
Total Bilirubin: 0.3 mg/dL (ref 0.2–1.2)
Total Protein: 7 g/dL (ref 6.0–8.3)

## 2017-03-31 LAB — TSH: TSH: 1.94 u[IU]/mL (ref 0.35–4.50)

## 2017-03-31 MED ORDER — AMLODIPINE BESYLATE 5 MG PO TABS: 5.0000 mg | ORAL_TABLET | Freq: Every day | ORAL | 11 refills | Status: DC

## 2017-03-31 MED ORDER — FLUOXETINE HCL 20 MG PO CAPS: 20.0000 mg | ORAL_CAPSULE | Freq: Every day | ORAL | 3 refills | Status: DC

## 2017-03-31 NOTE — Progress Notes (Signed)
Subjective:    Patient ID: Sierra Sparks, female    DOB: [birth year], 76 y.o.   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) She got off track caring for her husband  Stress eater -working on this  30.90 kg/m  colonoscopy 3/13 with 5 y recall   (did not get a reminder)  She did the cologuard Brother had colon cancer  She has had pain in LLQ on and off  No change in bowel habits or blood in stool   Mammogram 12/17 -due for that upcoming  Would like a ref to [facility]  Self breast exam -fibrocystic/no change   Tetanus shot 11/17  PNA utd  dexa 01/16 osteopenia  No falls or fractures  Exercises regularly  Takes ca and D   Flu shot was in nov  zostavax 2/12 Interested in shingrix if able to get   bp is up on first check  today -has been high in other offices  No cp or palpitations or headaches or edema  No side effects to medicines  BP Readings from Last 3 Encounters:  03/31/17 (!) 148/90  11/18/16 138/75  08/30/16 (!) 144/82   no meds now -but wt is up  Due for labs today   Hyperlipidemia Lab Results  Component Value Date   CHOL 223 (H) 02/21/2016   HDL 51.50 02/21/2016   LDLCALC 154 (H) 02/21/2016   LDLDIRECT 143.2 08/05/2012   TRIG 86.0 02/21/2016   CHOLHDL 4 02/21/2016   Due for labs   Patient Active Problem List   Diagnosis Date Noted  . Encounter for screening mammogram for breast cancer 03/31/2017  . Hearing loss in right ear 08/30/2016  . Otalgia of right ear 08/30/2016  . ETD (eustachian tube dysfunction) 08/16/2016  . Anal fissure  03/06/2016  . Screening mammogram, encounter for 02/26/2016  . Internal hemorrhoids 02/26/2016  . Routine general medical examination at a health care facility 02/03/2015  . Estrogen deficiency 02/03/2015  . Encounter for Medicare annual wellness exam 08/12/2012  . Glaucoma 08/12/2012  . Other screening mammogram 05/15/2011  . History of colon polyps 05/15/2011  . ALLERGIC RHINITIS 03/02/2008  . Osteopenia 01/05/2007  . HYPERCHOLESTEROLEMIA 10/24/2006  . ANXIETY 10/24/2006  . DEPRESSION 10/24/2006  . Essential hypertension 10/24/2006  . HEMORRHOIDS 10/24/2006  . FIBROCYSTIC BREAST DISEASE 10/24/2006  . OSTEOARTHRITIS 10/24/2006   Past Medical History:  Diagnosis Date  . Allergic rhinitis, cause unspecified   . Anemia   . Anxiety state, unspecified   . Colon polyps   . Depressive disorder, not elsewhere classified   . Diffuse cystic mastopathy   . Disorder of bone and cartilage, unspecified   . Diverticulosis   . External hemorrhoids   . Osteoarthrosis, unspecified whether generalized or localized, unspecified site    hands  . Osteoporosis   . Pure hypercholesterolemia   . Unspecified essential hypertension   . Unspecified hemorrhoids without mention of complication    Past Surgical History:  Procedure Laterality Date  .  BUNIONECTOMY  7/01   left   . CESAREAN SECTION  1981  . CHOLECYSTECTOMY    . COLONOSCOPY  10/03   polyp; hemorrhoids;diverticulosis  . COLONOSCOPY  2013   diverticulosis  . DEXA  3/02   osteopenia  . DEXA  1/05   Stable  . HAND SURGERY     DIP fusion-left  . HEMORRHOID SURGERY    . TONSILLECTOMY     childhood   Social History   Tobacco Use  . Smoking status: Never Smoker  . Smokeless tobacco: Never Used  Substance Use Topics  . Alcohol use: Yes    Alcohol/week: 0.0 oz    Comment: Occasional-wine  . Drug use: No   Family History  Problem Relation Age of Onset  . Colon cancer Brother        ? primary colon  . Arthritis Father   .  Lung cancer Father        + smoker  . Stroke Mother   . Coronary artery disease Mother 62  . Stroke Maternal Grandmother        hemorrhagic  . Uterine cancer Maternal Grandmother   . Bone cancer Paternal Uncle   . Liver cancer Brother   . Esophageal cancer Neg Hx   . Rectal cancer Neg Hx   . Stomach cancer Neg Hx   . Breast cancer Neg Hx    No Known Allergies Current Outpatient Medications on File Prior to Visit  Medication Sig Dispense Refill  . Calcium Carbonate-Vit D-Min (CALCIUM 1200) 1200-1000 MG-UNIT CHEW Chew 1 tablet by mouth 2 (two) times daily.     . Cetirizine HCl 10 MG CAPS Take 1 capsule by mouth daily.    Marland Kitchen KRILL OIL PO Take 1 capsule by mouth daily.    . Multiple Vitamin (MULTIVITAMIN) tablet Take 1 tablet by mouth daily.    . naproxen (NAPROSYN) 500 MG tablet Take 250 mg by mouth daily.      No current facility-administered medications on file prior to visit.      Review of Systems  Constitutional: Positive for fatigue. Negative for activity change, appetite change, fever and unexpected weight change.  HENT: Negative for congestion, ear pain, rhinorrhea, sinus pressure and sore throat.   Eyes: Negative for pain, redness and visual disturbance.  Respiratory: Negative for cough, shortness of breath and wheezing.   Cardiovascular: Negative for chest pain and palpitations.  Gastrointestinal: Positive for abdominal pain. Negative for abdominal distention, anal bleeding, blood in stool, constipation, diarrhea and nausea.  Endocrine: Negative for polydipsia and polyuria.  Genitourinary: Negative for dysuria, frequency and urgency.  Musculoskeletal: Negative for arthralgias, back pain and myalgias.  Skin: Negative for pallor and rash.  Allergic/Immunologic: Negative for environmental allergies.  Neurological: Negative for dizziness, syncope and headaches.  Hematological: Negative for adenopathy. Does not bruise/bleed easily.  Psychiatric/Behavioral: Negative for  decreased concentration and dysphoric mood. The patient is not nervous/anxious.        Objective:   Physical Exam  Constitutional: She appears well-developed and well-nourished. No distress.  overwt and well app  HENT:  Head: Normocephalic and atraumatic.  Right Ear: External ear normal.  Left Ear: External ear normal.  Mouth/Throat: Oropharynx is clear and moist.  Eyes: Conjunctivae and EOM are normal. Pupils are equal, round, and reactive to light. No scleral icterus.  Neck: Normal range of motion. Neck supple. No JVD present. Carotid bruit is not present. No thyromegaly present.  Cardiovascular: Normal rate, regular rhythm, normal heart sounds and  intact distal pulses. Exam reveals no gallop.  Pulmonary/Chest: Effort normal and breath sounds normal. No respiratory distress. She has no wheezes. She exhibits no tenderness.  Abdominal: Soft. Bowel sounds are normal. She exhibits no distension, no abdominal bruit and no mass. There is no tenderness.  Genitourinary: No breast swelling, tenderness, discharge or bleeding.  Genitourinary Comments: Breast exam: No mass, nodules, thickening, tenderness, bulging, retraction, inflamation, nipple discharge or skin changes noted.  No axillary or clavicular LA.      Musculoskeletal: Normal range of motion. She exhibits no edema or tenderness.  No kyphosis   Lymphadenopathy:    She has no cervical adenopathy.  Neurological: She is alert. She has normal reflexes. No cranial nerve deficit. She exhibits normal muscle tone. Coordination normal.  Skin: Skin is warm and dry. No rash noted. No erythema. No pallor.  Solar lentigines diffusely  Some solar aging   Psychiatric: She has a normal mood and affect.          Assessment & Plan:   Problem List Items Addressed This Visit      Cardiovascular and Mediastinum   Essential hypertension    Prev controlled with lifestyle bp is up several times - suspect wt gain and age related Disc lifestyle  change BP: (!) 148/90    Labs ordered  Will begin amlodipine 5 mg daily  Update if side eff  F/u planned       Relevant Medications   amLODipine (NORVASC) 5 MG tablet   Other Relevant Orders   CBC with Differential/Platelet (Completed)   Comprehensive metabolic panel (Completed)   TSH (Completed)   Lipid panel (Completed)     Musculoskeletal and Integument   Osteopenia    Rev dexa 1/16 No falls or fractures  Regular exercise/ca/D Will put off 2 y f/u until colonoscopy and mammogram are done         Other   Encounter for screening mammogram for breast cancer    Scheduled annual screening mammogram Nl breast exam today  Encouraged monthly self exams        Relevant Orders   MM DIGITAL SCREENING BILATERAL   History of colon polyps    Due for 5 y recall colonoscopy Also c/o some LLQ intermittent discomfort  Ref to GI      Relevant Orders   Ambulatory referral to Gastroenterology   HYPERCHOLESTEROLEMIA    Disc goals for lipids and reasons to control them Rev labs with pt (last time) Lipid panel today  Disc diet-not optimal lately Rev low sat fat diet in detail       Relevant Medications   amLODipine (NORVASC) 5 MG tablet   Other Relevant Orders   Lipid panel (Completed)   Routine general medical examination at a health care facility - Primary    Reviewed health habits including diet and exercise and skin cancer prevention Reviewed appropriate screening tests for age  Also reviewed health mt list, fam hx and immunization status , as well as social and family history   See HPI Schedule amw  Labs reviewed  Enc wt loss with diet/exercise Will begin tx HTN Disc shingrix vaccine  Ref for 5 y colonoscopy  Ref for annual mammogram

## 2017-03-31 NOTE — Patient Instructions (Addendum)
Call us when you are ready to schedule your bone density test   If you are interested in the Shingrix vaccine - call your pharmacy to check on price and availability  For blood pressure -work on fitness and healthy weight  Start amlodipine one pill daily  Let me know if any problems   Follow up in about a month   We will schedule colonoscopy and mammogram  Also medicare nurse visit   Labs today

## 2017-03-31 NOTE — Assessment & Plan Note (Signed)
Disc goals for lipids and reasons to control them Rev labs with pt (last time) Lipid panel today  Disc diet-not optimal lately Rev low sat fat diet in detail

## 2017-03-31 NOTE — Assessment & Plan Note (Signed)
Reviewed health habits including diet and exercise and skin cancer prevention Reviewed appropriate screening tests for age  Also reviewed health mt list, fam hx and immunization status , as well as social and family history   See HPI Schedule amw  Labs reviewed  Enc wt loss with diet/exercise Will begin tx HTN Disc shingrix vaccine  Ref for 5 y colonoscopy  Ref for annual mammogram

## 2017-03-31 NOTE — Assessment & Plan Note (Signed)
Prev controlled with lifestyle bp is up several times - suspect wt gain and age related Disc lifestyle change BP: (!) 148/90    Labs ordered  Will begin amlodipine 5 mg daily  Update if side eff  F/u planned

## 2017-03-31 NOTE — Assessment & Plan Note (Signed)
Due for 5 y recall colonoscopy Also c/o some LLQ intermittent discomfort  Ref to GI

## 2017-03-31 NOTE — Assessment & Plan Note (Signed)
Rev dexa 1/16 No falls or fractures  Regular exercise/ca/D Will put off 2 y f/u until colonoscopy and mammogram are done

## 2017-03-31 NOTE — Assessment & Plan Note (Signed)
Scheduled annual screening mammogram Nl breast exam today  Encouraged monthly self exams   

## 2017-04-01 ENCOUNTER — Encounter: Payer: Self-pay | Admitting: *Deleted

## 2017-04-04 ENCOUNTER — Ambulatory Visit (INDEPENDENT_AMBULATORY_CARE_PROVIDER_SITE_OTHER): Payer: Medicare Other

## 2017-04-04 VITALS — BP 120/60 | HR 43 | Temp 98.5°F | Ht 61.5 in | Wt 168.5 lb

## 2017-04-04 DIAGNOSIS — Z0000 Encounter for general adult medical examination without abnormal findings: Secondary | ICD-10-CM

## 2017-04-04 NOTE — Patient Instructions (Signed)
[name] , Thank you for taking time to come for your Medicare Wellness Visit. I appreciate your ongoing commitment to your health goals. Please review the following plan we discussed and let me know if I can assist you in the future.   These are the goals we discussed: Goals    . Increase physical activity     Starting 04/04/2017, I will continue to exercise for 60-90 minutes 3 days per week.        This is a list of the screening recommended for you and due dates:  Health Maintenance  Topic Date Due  . Colon Cancer Screening  04/14/2018*  . Mammogram  04/12/2017  . Tetanus Vaccine  02/20/2026  . Flu Shot  Completed  . DEXA scan (bone density measurement)  Completed  . Pneumonia vaccines  Completed  *Topic was postponed. The date shown is not the original due date.    [name] , Thank you for taking time to come for your Medicare Wellness Visit. I appreciate your ongoing commitment to your health goals. Please review the following plan we discussed and let me know if I can assist you in the future.   These are the goals we discussed: Goals    . Increase physical activity     Starting 04/04/2017, I will continue to exercise for 60-90 minutes 3 days per week.        This is a list of the screening recommended for you and due dates:  Health Maintenance  Topic Date Due  . Colon Cancer Screening  04/14/2018*  . Mammogram  04/12/2017  . Tetanus Vaccine  02/20/2026  . Flu Shot  Completed  . DEXA scan (bone density measurement)  Completed  . Pneumonia vaccines  Completed  *Topic was postponed. The date shown is not the original due date.   Preventive Care for Adults  A healthy lifestyle and preventive care can promote health and wellness. Preventive health guidelines for adults include the following key practices.  . A routine yearly physical is a good way to check with your health care provider about your health and preventive screening. It is a chance to share any  concerns and updates on your health and to receive a thorough exam.  . Visit your dentist for a routine exam and preventive care every 6 months. Brush your teeth twice a day and floss once a day. Good oral hygiene prevents tooth decay and gum disease.  . The frequency of eye exams is based on your age, health, family medical history, use  of contact lenses, and other factors. Follow your health care provider's recommendations for frequency of eye exams.  . Eat a healthy diet. Foods like vegetables, fruits, whole grains, low-fat dairy products, and lean protein foods contain the nutrients you need without too many calories. Decrease your intake of foods high in solid fats, added sugars, and salt. Eat the right amount of calories for you. Get information about a proper diet from your health care provider, if necessary.  . Regular physical exercise is one of the most important things you can do for your health. Most adults should get at least 150 minutes of moderate-intensity exercise (any activity that increases your heart rate and causes you to sweat) each week. In addition, most adults need muscle-strengthening exercises on 2 or more days a week.  Silver Sneakers may be a benefit available to you. To determine eligibility, you may visit the website: www.silversneakers.com or contact program at 276-747-3624 Mon-Fri  between 8AM-8PM.   . Maintain a healthy weight. The body mass index (BMI) is a screening tool to identify possible weight problems. It provides an estimate of body fat based on height and weight. Your health care provider can find your BMI and can help you achieve or maintain a healthy weight.   For adults 20 years and older: ? A BMI below 18.5 is considered underweight. ? A BMI of 18.5 to 24.9 is normal. ? A BMI of 25 to 29.9 is considered overweight. ? A BMI of 30 and above is considered obese.   . Maintain normal blood lipids and cholesterol levels by exercising and minimizing  your intake of saturated fat. Eat a balanced diet with plenty of fruit and vegetables. Blood tests for lipids and cholesterol should begin at age 37 and be repeated every 5 years. If your lipid or cholesterol levels are high, you are over 50, or you are at high risk for heart disease, you may need your cholesterol levels checked more frequently. Ongoing high lipid and cholesterol levels should be treated with medicines if diet and exercise are not working.  . If you smoke, find out from your health care provider how to quit. If you do not use tobacco, please do not start.  . If you choose to drink alcohol, please do not consume more than 2 drinks per day. One drink is considered to be 12 ounces (355 mL) of beer, 5 ounces (148 mL) of wine, or 1.5 ounces (44 mL) of liquor.  . If you are 69-30 years old, ask your health care provider if you should take aspirin to prevent strokes.  . Use sunscreen. Apply sunscreen liberally and repeatedly throughout the day. You should seek shade when your shadow is shorter than you. Protect yourself by wearing long sleeves, pants, a wide-brimmed hat, and sunglasses year round, whenever you are outdoors.  . Once a month, do a whole body skin exam, using a mirror to look at the skin on your back. Tell your health care provider of new moles, moles that have irregular borders, moles that are larger than a pencil eraser, or moles that have changed in shape or color.

## 2017-04-04 NOTE — Progress Notes (Signed)
Subjective:   Sierra Sparks is a 76 y.o. female who presents for Medicare Annual (Subsequent) preventive examination.  Review of Systems:  N/A Cardiac Risk Factors include: advanced age (>30men, >44 women);hypertension;dyslipidemia     Objective:     Vitals: BP 120/60 (BP Location: Right Arm, Patient Position: Sitting, Cuff Size: Normal)   Pulse (!) 43   Temp 98.5 F (36.9 C) (Oral)   Ht 5' 1.5" (1.562 m)   Wt 168 lb 8 oz (76.4 kg)   SpO2 94%   BMI 31.32 kg/m   Body mass index is 31.32 kg/m.  Advanced Directives 04/04/2017 02/21/2016  Does Patient Have a Medical Advance Directive? No Yes  Type of Advance Directive - Sierra Sparks;Living will  Does patient want to make changes to medical advance directive? Yes (MAU/Ambulatory/Procedural Areas - Information given) No - Patient declined  Copy of [place] in Chart? - Yes    Tobacco Social History   Tobacco Use  Smoking Status Never Smoker  Smokeless Tobacco Never Used     Counseling given: No   Clinical Intake:  Pre-visit preparation completed: Yes  Pain : 0-10 Pain Score: 1  Pain Type: Acute pain Pain Location: Abdomen Pain Orientation: Left, Lower     Nutritional Status: BMI > 30  Obese Nutritional Risks: None Diabetes: No  How often do you need to have someone help you when you read instructions, pamphlets, or other written materials from your doctor or pharmacy?: 1 - Never What is the last grade level you completed in school?: Bachelor of Arts   Interpreter Needed?: No  Comments: pt lives with spouse Information entered by :: LPinson, LPN  Past Medical History:  Diagnosis Date  . Allergic rhinitis, cause unspecified   . Anemia   . Anxiety state, unspecified   . Colon polyps   . Depressive disorder, not elsewhere classified   . Diffuse cystic mastopathy   . Disorder of bone and cartilage, unspecified   . Diverticulosis   . External hemorrhoids   .  Osteoarthrosis, unspecified whether generalized or localized, unspecified site    hands  . Osteoporosis   . Pure hypercholesterolemia   . Unspecified essential hypertension   . Unspecified hemorrhoids without mention of complication    Past Surgical History:  Procedure Laterality Date  . BUNIONECTOMY  7/01   left   . CESAREAN SECTION  1981  . CHOLECYSTECTOMY    . COLONOSCOPY  10/03   polyp; hemorrhoids;diverticulosis  . COLONOSCOPY  2013   diverticulosis  . DEXA  3/02   osteopenia  . DEXA  1/05   Stable  . HAND SURGERY     DIP fusion-left  . HEMORRHOID SURGERY    . TONSILLECTOMY     childhood   Family History  Problem Relation Age of Onset  . Colon cancer Brother        ? primary colon  . Arthritis Father   . Lung cancer Father        + smoker  . Stroke Mother   . Coronary artery disease Mother 68  . Stroke Maternal Grandmother        hemorrhagic  . Uterine cancer Maternal Grandmother   . Bone cancer Paternal Uncle   . Liver cancer Brother   . Esophageal cancer Neg Hx   . Rectal cancer Neg Hx   . Stomach cancer Neg Hx   . Breast cancer Neg Hx    Social History   Socioeconomic  History  . Marital status: Married    Spouse name: None  . Number of children: 2  . Years of education: None  . Highest education level: None  Social Needs  . Financial resource strain: None  . Food insecurity - worry: None  . Food insecurity - inability: None  . Transportation needs - medical: None  . Transportation needs - non-medical: None  Occupational History    Employer: OTHER    Comment: [employer]  Tobacco Use  . Smoking status: Never Smoker  . Smokeless tobacco: Never Used  Substance and Sexual Activity  . Alcohol use: Yes    Alcohol/week: 0.0 oz    Comment: Occasional-wine  . Drug use: No  . Sexual activity: Yes  Other Topics Concern  . None  Social History Narrative   Married      2 children      Works for [employer]    Outpatient Encounter  Medications as of 04/04/2017  Medication Sig  . amLODipine (NORVASC) 5 MG tablet Take 1 tablet (5 mg total) by mouth daily.  . Calcium Carbonate-Vit D-Min (CALCIUM 1200) 1200-1000 MG-UNIT CHEW Chew 1 tablet by mouth 2 (two) times daily.   . Cetirizine HCl 10 MG CAPS Take 1 capsule by mouth daily.  Marland Kitchen FLUoxetine (PROZAC) 20 MG capsule Take 1 capsule (20 mg total) by mouth daily.  Marland Kitchen KRILL OIL PO Take 1 capsule by mouth daily.  . Multiple Vitamin (MULTIVITAMIN) tablet Take 1 tablet by mouth daily.  . naproxen (NAPROSYN) 500 MG tablet Take 250 mg by mouth daily.    No facility-administered encounter medications on file as of 04/04/2017.     Activities of Daily Living In your present state of health, do you have any difficulty performing the following activities: 04/04/2017  Hearing? Y  Comment hearing loss in right ear  Vision? N  Difficulty concentrating or making decisions? N  Walking or climbing stairs? N  Dressing or bathing? N  Doing errands, shopping? N  Preparing Food and eating ? N  Using the Toilet? N  In the past six months, have you accidently leaked urine? N  Do you have problems with loss of bowel control? N  Managing your Medications? N  Managing your Finances? N  Housekeeping or managing your Housekeeping? N  Some recent data might be hidden    Patient Care Team: Tower, Wynelle Fanny, MD as PCP - General Tower, Wynelle Fanny, MD as Consulting Physician (Family Medicine) Bary Castilla Forest Gleason, MD (General Surgery)    Assessment:   This is a routine wellness examination for Sierra Sparks.   Hearing Screening   125Hz  250Hz  500Hz  1000Hz  2000Hz  3000Hz  4000Hz  6000Hz  8000Hz   Right ear:   0 0 40  40    Left ear:   40 40 40  40    Vision Screening Comments: Last vision exam in Nov 2018 with Dr. Jomarie Longs  Exercise Activities and Dietary recommendations Current Exercise Habits: Home exercise routine, Type of exercise: calisthenics;strength training/weights;stretching;treadmill, Time  (Minutes): > 60, Frequency (Times/Week): 3, Weekly Exercise (Minutes/Week): 0, Intensity: Moderate, Exercise limited by: None identified  Goals    . Increase physical activity     Starting 04/04/2017, I will continue to exercise for 60-90 minutes 3 days per week.        Fall Risk Fall Risk  04/04/2017 03/31/2017 02/21/2016 02/03/2015 09/08/2013  Falls in the past year? No No No No Yes  Number falls in past yr: - - - - 2  or more  Risk for fall due to : - - - - Impaired balance/gait   Depression Screen PHQ 2/9 Scores 04/04/2017 03/31/2017 02/21/2016 02/03/2015  PHQ - 2 Score 0 0 0 0  PHQ- 9 Score 0 - - -     Cognitive Function MMSE - Mini Mental State Exam 04/04/2017 02/21/2016  Orientation to time 5 5  Orientation to Place 5 5  Registration 3 3  Attention/ Calculation 0 0  Recall 3 3  Language- name 2 objects 0 0  Language- repeat 1 1  Language- follow 3 step command 3 3  Language- read & follow direction 0 0  Write a sentence 0 0  Copy design 0 0  Total score 20 20     PLEASE NOTE: A Mini-Cog screen was completed. Maximum score is 20. A value of 0 denotes this part of Folstein MMSE was not completed or the patient failed this part of the Mini-Cog screening.   Mini-Cog Screening Orientation to Time - Max 5 pts Orientation to Place - Max 5 pts Registration - Max 3 pts Recall - Max 3 pts Language Repeat - Max 1 pts Language Follow 3 Step Command - Max 3 pts     Immunization History  Administered Date(s) Administered  . Influenza Split 02/13/2011, 04/14/2012  . Influenza Whole 03/13/2006, 03/26/2007, 01/14/2008, 02/16/2009, 02/28/2010  . Influenza,inj,Quad PF,6+ Mos 03/17/2013, 03/28/2014, 02/03/2015, 02/21/2016, 02/25/2017  . Pneumococcal Conjugate-13 09/08/2013  . Pneumococcal Polysaccharide-23 02/28/2010  . Td 10/27/1996, 11/13/2005, 02/21/2016  . Zoster 05/24/2010    Screening Tests Health Maintenance  Topic Date Due  . COLONOSCOPY  04/14/2018 (Originally  07/10/2016)  . MAMMOGRAM  04/12/2017  . TETANUS/TDAP  02/20/2026  . INFLUENZA VACCINE  Completed  . DEXA SCAN  Completed  . PNA vac Low Risk Adult  Completed       Plan:     I have personally reviewed, addressed, and noted the following in the patient's chart:  A. Medical and social history B. Use of alcohol, tobacco or illicit drugs  C. Current medications and supplements D. Functional ability and status E.  Nutritional status F.  Physical activity G. Advance directives H. List of other physicians I.  Hospitalizations, surgeries, and ER visits in previous 12 months J.  [place] to include hearing, vision, cognitive, depression L. Referrals and appointments - none  In addition, I have reviewed and discussed with patient certain preventive protocols, quality metrics, and best practice recommendations. A written personalized care plan for preventive services as well as general preventive health recommendations were provided to patient.  See attached scanned questionnaire for additional information.   Signed,   Lindell Noe, MHA, BS, LPN Health Coach

## 2017-04-04 NOTE — Progress Notes (Signed)
Pre visit review using our clinic review tool, if applicable. No additional management support is needed unless otherwise documented below in the visit note. 

## 2017-04-04 NOTE — Progress Notes (Signed)
PCP notes:   Health maintenance:  Colonoscopy - postponed  Abnormal screenings:   Hearing - failed  Hearing Screening   125Hz  250Hz  500Hz  1000Hz  2000Hz  3000Hz  4000Hz  6000Hz  8000Hz   Right ear:   0 0 40  40    Left ear:   40 40 40  40      Patient concerns:   None  Nurse concerns:  Abnormal vital signs: P-43, BP 120/60. PCP notified.   Next PCP appt:   N/A; CPE in Dec 2018  I reviewed health advisor's note, was available for consultation, and agree with documentation and plan. Loura Pardon MD

## 2017-04-07 ENCOUNTER — Telehealth: Payer: Self-pay | Admitting: *Deleted

## 2017-04-07 NOTE — Telephone Encounter (Signed)
-----   Message from Abner Greenspan, MD sent at 04/06/2017  9:31 AM EST ----- Please let pt know that I rev her medicare visit-unsure why pulse was so low  Please alert [place] if you feel dizzy at all (especially with change in position) and we will watch this  I will see her in [place] as planned

## 2017-04-07 NOTE — Telephone Encounter (Signed)
Called and spouse advise me she wasn't at home, I will try back Wednesday but if pt calls back [facility] nurse please advise pt of Dr. Marliss Coots comments (CRM created)

## 2017-04-11 NOTE — Telephone Encounter (Signed)
Pt notified of Dr. Tower's comments and verbalized understanding  

## 2017-04-14 ENCOUNTER — Encounter: Payer: Self-pay | Admitting: Emergency Medicine

## 2017-04-14 ENCOUNTER — Emergency Department
Admission: EM | Admit: 2017-04-14 | Discharge: 2017-04-14 | Disposition: A | Discharge status: Home / Self Care | Payer: Medicare Other | Attending: Emergency Medicine | Admitting: Emergency Medicine

## 2017-04-14 DIAGNOSIS — Y9389 Activity, other specified: Secondary | ICD-10-CM | POA: Insufficient documentation

## 2017-04-14 DIAGNOSIS — Y999 Unspecified external cause status: Secondary | ICD-10-CM | POA: Diagnosis not present

## 2017-04-14 DIAGNOSIS — W01118A Fall on same level from slipping, tripping and stumbling with subsequent striking against other sharp object, initial encounter: Secondary | ICD-10-CM | POA: Insufficient documentation

## 2017-04-14 DIAGNOSIS — S61411A Laceration without foreign body of right hand, initial encounter: Secondary | ICD-10-CM | POA: Insufficient documentation

## 2017-04-14 DIAGNOSIS — Y929 Unspecified place or not applicable: Secondary | ICD-10-CM | POA: Insufficient documentation

## 2017-04-14 DIAGNOSIS — I10 Essential (primary) hypertension: Secondary | ICD-10-CM | POA: Diagnosis not present

## 2017-04-14 MED ORDER — LIDOCAINE HCL (PF) 1 % IJ SOLN
INTRAMUSCULAR | Status: AC
  Filled 2017-04-14: qty 5

## 2017-04-14 MED ORDER — BACITRACIN ZINC 500 UNIT/GM EX OINT
TOPICAL_OINTMENT | CUTANEOUS | Status: AC
  Administered 2017-04-14: 1 via TOPICAL
  Filled 2017-04-14: qty 0.9

## 2017-04-14 NOTE — ED Notes (Signed)
Neosporin dressing applied with coban as directed by MD

## 2017-04-14 NOTE — ED Triage Notes (Signed)
Pt reports she was carrying a hot pot and pot was falling, pt fell with it. Laceration to right thumb, denies hitting head, denies LOC.

## 2017-04-14 NOTE — ED Provider Notes (Signed)
[facility] Emergency Department Provider Note  ____________________________________________  Time seen: Approximately 8:35 PM  I have reviewed the triage vital signs and the nursing notes.   HISTORY  Chief Complaint Laceration   HPI Sierra Sparks is a 76 y.o. female who presents to the emergency department for a laceration repair. She was walking with a crock pot in her hands, it started to tip forward and she somehow lost her balance. She fell, causing the crock pot to break and now has a laceration to her right hand. Tetanus immunization was given last week. She denies loss of consciousness or striking her head. She denies other complaints.   Past Medical History:  Diagnosis Date  . Allergic rhinitis, cause unspecified   . Anemia   . Anxiety state, unspecified   . Colon polyps   . Depressive disorder, not elsewhere classified   . Diffuse cystic mastopathy   . Disorder of bone and cartilage, unspecified   . Diverticulosis   . External hemorrhoids   . Osteoarthrosis, unspecified whether generalized or localized, unspecified site    hands  . Osteoporosis   . Pure hypercholesterolemia   . Unspecified essential hypertension   . Unspecified hemorrhoids without mention of complication     Patient Active Problem List   Diagnosis Date Noted  . Encounter for screening mammogram for breast cancer 03/31/2017  . Hearing loss in right ear 08/30/2016  . Otalgia of right ear 08/30/2016  . Anal fissure 03/06/2016  . Screening mammogram, encounter for 02/26/2016  . Internal hemorrhoids 02/26/2016  . Routine general medical examination at a health care facility 02/03/2015  . Estrogen deficiency 02/03/2015  . Encounter for Medicare annual wellness exam 08/12/2012  . Glaucoma 08/12/2012  . Other screening mammogram 05/15/2011  . History of colon polyps 05/15/2011  . ALLERGIC RHINITIS 03/02/2008  . Osteopenia 01/05/2007  . HYPERCHOLESTEROLEMIA 10/24/2006   . ANXIETY 10/24/2006  . DEPRESSION 10/24/2006  . Essential hypertension 10/24/2006  . HEMORRHOIDS 10/24/2006  . FIBROCYSTIC BREAST DISEASE 10/24/2006  . OSTEOARTHRITIS 10/24/2006    Past Surgical History:  Procedure Laterality Date  . BUNIONECTOMY  7/01   left   . CESAREAN SECTION  1981  . CHOLECYSTECTOMY    . COLONOSCOPY  10/03   polyp; hemorrhoids;diverticulosis  . COLONOSCOPY  2013   diverticulosis  . DEXA  3/02   osteopenia  . DEXA  1/05   Stable  . HAND SURGERY     DIP fusion-left  . HEMORRHOID SURGERY    . TONSILLECTOMY     childhood    Prior to Admission medications   Medication Sig Start Date End Date Taking? Authorizing Provider  amLODipine (NORVASC) 5 MG tablet Take 1 tablet (5 mg total) by mouth daily. 03/31/17   Tower, Wynelle Fanny, MD  Calcium Carbonate-Vit D-Min (CALCIUM 1200) 1200-1000 MG-UNIT CHEW Chew 1 tablet by mouth 2 (two) times daily.     [provider]  Cetirizine HCl 10 MG CAPS Take 1 capsule by mouth daily.    [provider]  FLUoxetine (PROZAC) 20 MG capsule Take 1 capsule (20 mg total) by mouth daily. 03/31/17   Tower, Wynelle Fanny, MD  KRILL OIL PO Take 1 capsule by mouth daily.    [provider]  Multiple Vitamin (MULTIVITAMIN) tablet Take 1 tablet by mouth daily.    [provider]  naproxen (NAPROSYN) 500 MG tablet Take 250 mg by mouth daily.     [provider]    Allergies Patient  has no known allergies.  Family History  Problem Relation Age of Onset  . Colon cancer Brother        ? primary colon  . Arthritis Father   . Lung cancer Father        + smoker  . Stroke Mother   . Coronary artery disease Mother 10  . Stroke Maternal Grandmother        hemorrhagic  . Uterine cancer Maternal Grandmother   . Bone cancer Paternal Uncle   . Liver cancer Brother   . Esophageal cancer Neg Hx   . Rectal cancer Neg Hx   . Stomach cancer Neg Hx   . Breast cancer Neg Hx     Social History Social  History   Tobacco Use  . Smoking status: Never Smoker  . Smokeless tobacco: Never Used  Substance Use Topics  . Alcohol use: Yes    Alcohol/week: 0.0 oz    Comment: Occasional-wine  . Drug use: No    Review of Systems  Constitutional: Negative for fever. Respiratory: Negative for cough or shortness of breath.  Musculoskeletal: Positive for myalgias Skin: Positive for laceration to the right hand. Neurological: Negative for numbness or paresthesias. ____________________________________________   PHYSICAL EXAM:  VITAL SIGNS: ED Triage Vitals  Enc Vitals Group     BP 04/14/17 1819 (!) 157/96     Pulse Rate 04/14/17 1818 87     Resp 04/14/17 1818 15     Temp 04/14/17 1818 98.4 F (36.9 C)     Temp Source 04/14/17 1818 Oral     SpO2 04/14/17 1818 95 %     Weight 04/14/17 1818 168 lb (76.2 kg)     Height 04/14/17 1818 5\' 1"  (1.549 m)     Head Circumference --      Peak Flow --      Pain Score 04/14/17 1817 2     Pain Loc --      Pain Edu? --      Excl. in [place]? --      Constitutional: Well appearing. Eyes: Conjunctivae are clear without discharge or drainage. Nose: No rhinorrhea noted. Mouth/Throat: Airway is patent.  Neck: No stridor. Unrestricted range of motion observed.  Cardiovascular: Capillary refill is <3 seconds.  Respiratory: Respirations are even and unlabored.. Musculoskeletal: Unrestricted range of motion observed. Neurologic: Awake, alert, and oriented x 4.  Skin:  2cm laceration to the right thenar eminence.  ____________________________________________   LABS (all labs ordered are listed, but only abnormal results are displayed)  Labs Reviewed - No data to display ____________________________________________  EKG  Not indicated ____________________________________________  RADIOLOGY  Not indicated ____________________________________________   PROCEDURES  .Marland KitchenLaceration Repair Date/Time: 04/14/2017 8:39 PM Performed by: Victorino Dike, FNP Authorized by: Victorino Dike, FNP   Consent:    Consent obtained:  Verbal   Consent given by:  Patient   Alternatives discussed:  No treatment Anesthesia (see MAR for exact dosages):    Anesthesia method:  Local infiltration   Local anesthetic:  Lidocaine 1% w/o epi Laceration details:    Location:  Hand   Hand location:  R palm   Length (cm):  2 Repair type:    Repair type:  Simple Pre-procedure details:    Preparation:  Patient was prepped and draped in usual sterile fashion Exploration:    Hemostasis achieved with:  Direct pressure   Contaminated: no   Treatment:    Area cleansed with:  Betadine   Amount of  cleaning:  Standard   Irrigation solution:  Sterile saline   Irrigation volume:  60   Irrigation method:  Syringe   Visualized foreign bodies/material removed: no   Skin repair:    Repair method:  Sutures   Suture size:  5-0   Suture material:  Nylon   Suture technique:  Simple interrupted   Number of sutures:  4 Approximation:    Approximation:  Close   Vermilion border: well-aligned   Post-procedure details:    Dressing:  Antibiotic ointment and sterile dressing   Patient tolerance of procedure:  Tolerated well, no immediate complications   ____________________________________________   INITIAL IMPRESSION / ASSESSMENT AND PLAN / ED COURSE  Sierra Sparks is a 76 y.o. female who presents to the emergency department for mechanical, non-syncopal fall that resulted in a laceration to her right hand.  Laceration was easily repaired.  Patient was given wound care instructions.  She will follow-up with her primary care provider in 10 days to have the sutures removed.  She knows to be seen sooner for any symptoms of concern.  She was encouraged to return to the emergency department for symptoms of change or worsen or for new concerns if she is unable to schedule an appointment.   Medications  lidocaine (PF) (XYLOCAINE) 1 % injection (not  administered)  bacitracin 500 UNIT/GM ointment (1 application Topical Given 04/14/17 1919)     Pertinent labs & imaging results that were available during my care of the patient were reviewed by me and considered in my medical decision making (see chart for details). ____________________________________________   FINAL CLINICAL IMPRESSION(S) / ED DIAGNOSES  Final diagnoses:  Laceration of right hand without foreign body, initial encounter    ED Discharge Orders    None       Note:  This document was prepared using Dragon voice recognition software and may include unintentional dictation errors.    Victorino Dike, FNP 04/14/17 2043    Nance Pear, MD 04/14/17 [id number]

## 2017-04-14 NOTE — Discharge Instructions (Signed)
Do not get the sutured area wet for 24 hours. After 24 hours, shower/bathe as usual and pat the area dry. Change the bandage 2 times per day and apply antibiotic ointment. Leave open to air when at no risk of getting the area dirty, but cover at night before bed.   

## 2017-04-14 NOTE — ED Notes (Addendum)
See triage note  Slipped and fell laceration noted to right thumb.  states she was carrying a crock pot and it started to slip .  crock pot broke  Laceration to thumb

## 2017-04-21 ENCOUNTER — Ambulatory Visit (AMBULATORY_SURGERY_CENTER): Payer: Self-pay | Admitting: *Deleted

## 2017-04-21 ENCOUNTER — Other Ambulatory Visit: Payer: Self-pay

## 2017-04-21 VITALS — Ht 61.0 in | Wt 168.0 lb

## 2017-04-21 DIAGNOSIS — Z800 Family history of malignant neoplasm of digestive organs: Secondary | ICD-10-CM

## 2017-04-21 DIAGNOSIS — Z86010 Personal history of colonic polyps: Secondary | ICD-10-CM

## 2017-04-21 MED ORDER — NA SULFATE-K SULFATE-MG SULF 17.5-3.13-1.6 GM/177ML PO SOLN
1.0000 | Freq: Once | ORAL | 0 refills | Status: AC
Start: 2017-04-21 — End: 2017-04-21

## 2017-04-21 NOTE — Progress Notes (Signed)
No egg or soy allergy known to patient  No issues with past sedation with any surgeries  or procedures, no intubation problems  No diet pills per patient No home 02 use per patient  No blood thinners per patient  Pt denies issues with constipation  No A fib or A flutter  EMMI video sent to pt's e mail - pt declined  Explained to pt and husband they will have a co pay for the prep at the pharmacy but unsure the amount- call with issues

## 2017-04-25 ENCOUNTER — Encounter: Payer: Self-pay | Admitting: Family Medicine

## 2017-04-25 ENCOUNTER — Ambulatory Visit: Payer: Medicare Other | Admitting: Family Medicine

## 2017-04-25 VITALS — BP 118/76 | HR 75 | Temp 98.4°F | Ht 61.0 in | Wt 166.5 lb

## 2017-04-25 DIAGNOSIS — S61411A Laceration without foreign body of right hand, initial encounter: Secondary | ICD-10-CM | POA: Insufficient documentation

## 2017-04-25 DIAGNOSIS — S61411S Laceration without foreign body of right hand, sequela: Secondary | ICD-10-CM

## 2017-04-25 NOTE — Progress Notes (Signed)
Subjective:    Patient ID: Sierra Sparks, female    DOB: 1940-08-05, 77 y.o.   MRN: [id number]  HPI  Here for a suture removal   She was seen in ED on 12/31 for laceration on her R hand from a crock pot that broke  (her hot pad slipped)  She fell with it/lost her balance  No other injuries  2 cm laceration repaired with 4 nylon sutures  Was told to f/u 10 d  Tetanus shot is utd   It is feeling ok overall -not bothering her much  A little soreness  Very scant redness No drainage   Patient Active Problem List   Diagnosis Date Noted  . Laceration of skin of right hand 04/25/2017  . Encounter for screening mammogram for breast cancer 03/31/2017  . Hearing loss in right ear 08/30/2016  . Otalgia of right ear 08/30/2016  . Anal fissure 03/06/2016  . Screening mammogram, encounter for 02/26/2016  . Internal hemorrhoids 02/26/2016  . Routine general medical examination at a health care facility 02/03/2015  . Estrogen deficiency 02/03/2015  . Encounter for Medicare annual wellness exam 08/12/2012  . Glaucoma 08/12/2012  . Other screening mammogram 05/15/2011  . History of colon polyps 05/15/2011  . ALLERGIC RHINITIS 03/02/2008  . Osteopenia 01/05/2007  . HYPERCHOLESTEROLEMIA 10/24/2006  . ANXIETY 10/24/2006  . DEPRESSION 10/24/2006  . Essential hypertension 10/24/2006  . HEMORRHOIDS 10/24/2006  . FIBROCYSTIC BREAST DISEASE 10/24/2006  . OSTEOARTHRITIS 10/24/2006   Past Medical History:  Diagnosis Date  . Allergic rhinitis, cause unspecified   . Allergy   . Anemia   . Anxiety state, unspecified   . Cataract    forming   . Colon polyps   . Depressive disorder, not elsewhere classified   . Diffuse cystic mastopathy   . Disorder of bone and cartilage, unspecified   . Diverticulosis   . External hemorrhoids   . GERD (gastroesophageal reflux disease)   . Glaucoma    narrow angle thatwas corrected with laser surgery   . Osteoarthrosis, unspecified whether  generalized or localized, unspecified site    hands  . Osteoporosis   . Pure hypercholesterolemia   . Thyroid disease    years past- no meds currently   . Unspecified essential hypertension   . Unspecified hemorrhoids without mention of complication    Past Surgical History:  Procedure Laterality Date  . BUNIONECTOMY  7/01   left   . CESAREAN SECTION  1981  . CHOLECYSTECTOMY    . COLONOSCOPY  10/03   polyp; hemorrhoids;diverticulosis  . COLONOSCOPY  2013   diverticulosis  . DEXA  3/02   osteopenia  . DEXA  1/05   Stable  . HAND SURGERY     DIP fusion-left  . HEMORRHOID SURGERY    . POLYPECTOMY    . TONSILLECTOMY     childhood   Social History   Tobacco Use  . Smoking status: Never Smoker  . Smokeless tobacco: Never Used  Substance Use Topics  . Alcohol use: Yes    Alcohol/week: 0.0 oz    Comment: Occasional-wine  . Drug use: No   Family History  Problem Relation Age of Onset  . Colon cancer Brother        ? primary colon  . Arthritis Father   . Lung cancer Father        + smoker  . Stroke Mother   . Coronary artery disease Mother 67  . Stroke Maternal Grandmother  hemorrhagic  . Uterine cancer Maternal Grandmother   . Bone cancer Paternal Uncle   . Liver cancer Brother   . Esophageal cancer Neg Hx   . Rectal cancer Neg Hx   . Stomach cancer Neg Hx   . Breast cancer Neg Hx   . Colon polyps Neg Hx    No Known Allergies Current Outpatient Medications on File Prior to Visit  Medication Sig Dispense Refill  . amLODipine (NORVASC) 5 MG tablet Take 1 tablet (5 mg total) by mouth daily. 30 tablet 11  . Calcium Carbonate-Vit D-Min (CALCIUM 1200) 1200-1000 MG-UNIT CHEW Chew 1 tablet by mouth 2 (two) times daily.     . Cetirizine HCl 10 MG CAPS Take 1 capsule by mouth daily.    Marland Kitchen FLUoxetine (PROZAC) 20 MG capsule Take 1 capsule (20 mg total) by mouth daily. 90 capsule 3  . KRILL OIL PO Take 1 capsule by mouth daily.    . Multiple Vitamin (MULTIVITAMIN)  tablet Take 1 tablet by mouth daily.    . naproxen (NAPROSYN) 500 MG tablet Take 250 mg by mouth daily.      No current facility-administered medications on file prior to visit.     Review of Systems  Constitutional: Negative for activity change, appetite change, fatigue, fever and unexpected weight change.  HENT: Negative for congestion, ear pain, rhinorrhea, sinus pressure and sore throat.   Eyes: Negative for pain, redness and visual disturbance.  Respiratory: Negative for cough, shortness of breath and wheezing.   Cardiovascular: Negative for chest pain and palpitations.  Gastrointestinal: Negative for abdominal pain, blood in stool, constipation and diarrhea.  Endocrine: Negative for polydipsia and polyuria.  Genitourinary: Negative for dysuria, frequency and urgency.  Musculoskeletal: Negative for arthralgias, back pain and myalgias.  Skin: Negative for pallor and rash.       Pos for laceration of R hand   Allergic/Immunologic: Negative for environmental allergies.  Neurological: Negative for dizziness, syncope and headaches.  Hematological: Negative for adenopathy. Does not bruise/bleed easily.  Psychiatric/Behavioral: Negative for decreased concentration and dysphoric mood. The patient is not nervous/anxious.        Objective:   Physical Exam  Constitutional: She appears well-developed and well-nourished. No distress.  HENT:  Head: Normocephalic.  Eyes: Conjunctivae and EOM are normal. Pupils are equal, round, and reactive to light.  Neurological: She is alert. No cranial nerve deficit.  Skin: Skin is warm and dry. No rash noted. No pallor.  2 cm repaired laceration on thenar area of R hand  4 simple interrupted sutures with good wound edge approximation  Scant 1 mm of erythema at border with some scab formation   Sutures removed and steri strips applied without complication  Pt tolerated well  Psychiatric: She has a normal mood and affect.          Assessment &  Plan:   Problem List Items Addressed This Visit      Musculoskeletal and Integument   Laceration of skin of right hand - Primary    4 simple interrupted sutures removed from thenar area of R hand  Wound is healing well  Steri strips applied  Aftercare disc  Will update if red/pain/swelling or other symptoms

## 2017-04-25 NOTE — Assessment & Plan Note (Signed)
4 simple interrupted sutures removed from thenar area of R hand  Wound is healing well  Steri strips applied  Aftercare disc  Will update if red/pain/swelling or other symptoms

## 2017-04-25 NOTE — Patient Instructions (Signed)
Wound looks good  Keep clean with soap and water  Use gloves for dish washing Alert me if red/painful or swollen   Update if not starting to improve in a week or if worsening

## 2017-04-29 ENCOUNTER — Encounter: Payer: Self-pay | Admitting: Internal Medicine

## 2017-05-02 ENCOUNTER — Ambulatory Visit: Payer: Medicare Other | Admitting: Family Medicine

## 2017-05-02 ENCOUNTER — Encounter: Payer: Self-pay | Admitting: Family Medicine

## 2017-05-02 VITALS — BP 128/74 | HR 75 | Temp 98.5°F | Ht 61.0 in | Wt 166.5 lb

## 2017-05-02 DIAGNOSIS — I10 Essential (primary) hypertension: Secondary | ICD-10-CM | POA: Diagnosis not present

## 2017-05-02 DIAGNOSIS — E7800 Pure hypercholesterolemia, unspecified: Secondary | ICD-10-CM

## 2017-05-02 DIAGNOSIS — E669 Obesity, unspecified: Secondary | ICD-10-CM | POA: Diagnosis not present

## 2017-05-02 NOTE — Assessment & Plan Note (Signed)
bmi of 31.4  Getting back on track with weight watchers diet and exercise at the gym  Discussed how this problem influences overall health and the risks it imposes  Reviewed plan for weight loss with lower calorie diet (via better food choices and also portion control or program like weight watchers) and exercise building up to or more than 30 minutes 5 days per week including some aerobic activity

## 2017-05-02 NOTE — Progress Notes (Signed)
Subjective:    Patient ID: Sierra Sparks, female    DOB: 04-May-1940, 77 y.o.   MRN: [id number]  HPI Here for 1 months f/u  In December amlodipine was added for addnl bp control   BP Readings from Last 3 Encounters:  05/02/17 128/74  04/25/17 118/76  04/14/17 (!) 157/96  no side effects or problems   Hand is doing well s/p suture removal as well   Getting back on track with diet /exercise  On a modified weight watchers program now    [facility] Readings from Last 3 Encounters:  05/02/17 166 lb 8 oz (75.5 kg)  04/25/17 166 lb 8 oz (75.5 kg)  04/21/17 168 lb (76.2 kg)   31.46 kg/m   Lab Results  Component Value Date   CREATININE 0.95 03/31/2017   BUN 21 03/31/2017   NA 136 03/31/2017   K 3.4 (L) 03/31/2017   CL 101 03/31/2017   CO2 30 03/31/2017   Lab Results  Component Value Date   ALT 14 03/31/2017   AST 18 03/31/2017   ALKPHOS 95 03/31/2017   BILITOT 0.3 03/31/2017    Lab Results  Component Value Date   CHOL 224 (H) 03/31/2017   HDL 49.90 03/31/2017   LDLCALC 138 (H) 03/31/2017   LDLDIRECT 143.2 08/05/2012   TRIG 182.0 (H) 03/31/2017   CHOLHDL 4 03/31/2017   Patient Active Problem List   Diagnosis Date Noted  . Obesity (BMI 30.0-34.9) 05/02/2017  . Laceration of skin of right hand 04/25/2017  . Encounter for screening mammogram for breast cancer 03/31/2017  . Hearing loss in right ear 08/30/2016  . Anal fissure 03/06/2016  . Screening mammogram, encounter for 02/26/2016  . Internal hemorrhoids 02/26/2016  . Routine general medical examination at a health care facility 02/03/2015  . Estrogen deficiency 02/03/2015  . Encounter for Medicare annual wellness exam 08/12/2012  . Glaucoma 08/12/2012  . Other screening mammogram 05/15/2011  . History of colon polyps 05/15/2011  . ALLERGIC RHINITIS 03/02/2008  . Osteopenia 01/05/2007  . HYPERCHOLESTEROLEMIA 10/24/2006  . ANXIETY 10/24/2006  . DEPRESSION 10/24/2006  . Essential hypertension 10/24/2006  .  HEMORRHOIDS 10/24/2006  . FIBROCYSTIC BREAST DISEASE 10/24/2006  . OSTEOARTHRITIS 10/24/2006   Past Medical History:  Diagnosis Date  . Allergic rhinitis, cause unspecified   . Allergy   . Anemia   . Anxiety state, unspecified   . Cataract    forming   . Colon polyps   . Depressive disorder, not elsewhere classified   . Diffuse cystic mastopathy   . Disorder of bone and cartilage, unspecified   . Diverticulosis   . External hemorrhoids   . GERD (gastroesophageal reflux disease)   . Glaucoma    narrow angle thatwas corrected with laser surgery   . Osteoarthrosis, unspecified whether generalized or localized, unspecified site    hands  . Osteoporosis   . Pure hypercholesterolemia   . Thyroid disease    years past- no meds currently   . Unspecified essential hypertension   . Unspecified hemorrhoids without mention of complication    Past Surgical History:  Procedure Laterality Date  . BUNIONECTOMY  7/01   left   . CESAREAN SECTION  1981  . CHOLECYSTECTOMY    . COLONOSCOPY  10/03   polyp; hemorrhoids;diverticulosis  . COLONOSCOPY  2013   diverticulosis  . DEXA  3/02   osteopenia  . DEXA  1/05   Stable  . HAND SURGERY     DIP fusion-left  .  HEMORRHOID SURGERY    . POLYPECTOMY    . TONSILLECTOMY     childhood   Social History   Tobacco Use  . Smoking status: Never Smoker  . Smokeless tobacco: Never Used  Substance Use Topics  . Alcohol use: Yes    Alcohol/week: 0.0 oz    Comment: Occasional-wine  . Drug use: No   Family History  Problem Relation Age of Onset  . Colon cancer Brother        ? primary colon  . Arthritis Father   . Lung cancer Father        + smoker  . Stroke Mother   . Coronary artery disease Mother 37  . Stroke Maternal Grandmother        hemorrhagic  . Uterine cancer Maternal Grandmother   . Bone cancer Paternal Uncle   . Liver cancer Brother   . Esophageal cancer Neg Hx   . Rectal cancer Neg Hx   . Stomach cancer Neg Hx   .  Breast cancer Neg Hx   . Colon polyps Neg Hx    No Known Allergies Current Outpatient Medications on File Prior to Visit  Medication Sig Dispense Refill  . amLODipine (NORVASC) 5 MG tablet Take 1 tablet (5 mg total) by mouth daily. 30 tablet 11  . Calcium Carbonate-Vit D-Min (CALCIUM 1200) 1200-1000 MG-UNIT CHEW Chew 1 tablet by mouth 2 (two) times daily.     . Cetirizine HCl 10 MG CAPS Take 1 capsule by mouth daily.    Marland Kitchen FLUoxetine (PROZAC) 20 MG capsule Take 1 capsule (20 mg total) by mouth daily. 90 capsule 3  . KRILL OIL PO Take 1 capsule by mouth daily.    . Multiple Vitamin (MULTIVITAMIN) tablet Take 1 tablet by mouth daily.    . naproxen (NAPROSYN) 500 MG tablet Take 250 mg by mouth daily.      No current facility-administered medications on file prior to visit.       Review of Systems  Constitutional: Negative for activity change, appetite change, fatigue, fever and unexpected weight change.  HENT: Negative for congestion, ear pain, rhinorrhea, sinus pressure and sore throat.   Eyes: Negative for pain, redness and visual disturbance.  Respiratory: Negative for cough, shortness of breath and wheezing.   Cardiovascular: Negative for chest pain and palpitations.  Gastrointestinal: Negative for abdominal pain, blood in stool, constipation and diarrhea.  Endocrine: Negative for polydipsia and polyuria.  Genitourinary: Negative for dysuria, frequency and urgency.  Musculoskeletal: Negative for arthralgias, back pain and myalgias.  Skin: Negative for pallor and rash.  Allergic/Immunologic: Negative for environmental allergies.  Neurological: Negative for dizziness, syncope and headaches.  Hematological: Negative for adenopathy. Does not bruise/bleed easily.  Psychiatric/Behavioral: Negative for decreased concentration and dysphoric mood. The patient is not nervous/anxious.        Objective:   Physical Exam  Constitutional: She appears well-developed and well-nourished. No  distress.  Well appearing   HENT:  Head: Normocephalic and atraumatic.  Mouth/Throat: Oropharynx is clear and moist.  Eyes: Conjunctivae and EOM are normal. Pupils are equal, round, and reactive to light.  Neck: Normal range of motion. Neck supple. No JVD present. Carotid bruit is not present. No thyromegaly present.  Cardiovascular: Normal rate, regular rhythm, normal heart sounds and intact distal pulses. Exam reveals no gallop.  Pulmonary/Chest: Effort normal and breath sounds normal. No respiratory distress. She has no wheezes. She has no rales.  No crackles  Abdominal: Soft. Bowel sounds are normal. She  exhibits no distension, no abdominal bruit and no mass. There is no tenderness.  Musculoskeletal: She exhibits no edema.  Lymphadenopathy:    She has no cervical adenopathy.  Neurological: She is alert. She has normal reflexes.  Skin: Skin is warm and dry. No rash noted. No pallor.  Psychiatric: She has a normal mood and affect.          Assessment & Plan:   Problem List Items Addressed This Visit      Cardiovascular and Mediastinum   Essential hypertension - Primary    Much improved with amlodipine  bp in fair control at this time  BP Readings from Last 1 Encounters:  05/02/17 128/74   No changes needed Disc lifstyle change with low sodium diet and exercise           Other   HYPERCHOLESTEROLEMIA    Planning to re check in about 3 mo  Disc goals for lipids and Sparks to control them Rev labs with pt Rev low sat fat diet in detail  Back to eating low sat/trans fat with wt watchers       Obesity (BMI 30.0-34.9)    bmi of 31.4  Getting back on track with weight watchers diet and exercise at the gym  Discussed how this problem influences overall health and the risks it imposes  Reviewed plan for weight loss with lower calorie diet (via better food choices and also portion control or program like weight watchers) and exercise building up to or more than 30  minutes 5 days per week including some aerobic activity

## 2017-05-02 NOTE — Patient Instructions (Addendum)
Keep working on healthy diet/exercise  Try to get most of your carbohydrates from produce (with the exception of white potatoes)  Eat less bread/pasta/rice/snack foods/cereals/sweets and other items from the middle of the grocery store (processed carbs)   For cholesterol  Avoid red meat/ fried foods/ egg yolks/ fatty breakfast meats/ butter, cheese and high fat dairy/ and shellfish    Blood pressure is better! Continue the amlodipine   Let's re check cholesterol in about 3 months or so

## 2017-05-02 NOTE — Assessment & Plan Note (Signed)
Planning to re check in about 3 mo  Disc goals for lipids and reasons to control them Rev labs with pt Rev low sat fat diet in detail  Back to eating low sat/trans fat with wt watchers

## 2017-05-02 NOTE — Assessment & Plan Note (Signed)
Much improved with amlodipine  bp in fair control at this time  BP Readings from Last 1 Encounters:  05/02/17 128/74   No changes needed Disc lifstyle change with low sodium diet and exercise

## 2017-05-07 ENCOUNTER — Encounter: Payer: Self-pay | Admitting: Internal Medicine

## 2017-05-07 ENCOUNTER — Ambulatory Visit (AMBULATORY_SURGERY_CENTER): Payer: Medicare Other | Admitting: Internal Medicine

## 2017-05-07 ENCOUNTER — Other Ambulatory Visit: Payer: Self-pay

## 2017-05-07 VITALS — BP 150/81 | HR 58 | Temp 98.4°F | Resp 13 | Ht 61.0 in | Wt 168.0 lb

## 2017-05-07 DIAGNOSIS — Z86010 Personal history of colonic polyps: Secondary | ICD-10-CM

## 2017-05-07 MED ORDER — SODIUM CHLORIDE 0.9 % IV SOLN: 500.0000 mL | Freq: Once | INTRAVENOUS | Status: DC

## 2017-05-07 NOTE — Patient Instructions (Signed)
   Continue current medications and diet    YOU HAD AN ENDOSCOPIC PROCEDURE TODAY AT [facility]:   Refer to the procedure report that was given to you for any specific questions about what was found during the examination.  If the procedure report does not answer your questions, please call your gastroenterologist to clarify.  If you requested that your care partner not be given the details of your procedure findings, then the procedure report has been included in a sealed envelope for you to review at your convenience later.  YOU SHOULD EXPECT: Some feelings of bloating in the abdomen. Passage of more gas than usual.  Walking can help get rid of the air that was put into your GI tract during the procedure and reduce the bloating. If you had a lower endoscopy (such as a colonoscopy or flexible sigmoidoscopy) you may notice spotting of blood in your stool or on the toilet paper. If you underwent a bowel prep for your procedure, you may not have a normal bowel movement for a few days.  Please Note:  You might notice some irritation and congestion in your nose or some drainage.  This is from the oxygen used during your procedure.  There is no need for concern and it should clear up in a day or so.  SYMPTOMS TO REPORT IMMEDIATELY:   Following lower endoscopy (colonoscopy or flexible sigmoidoscopy):  Excessive amounts of blood in the stool  Significant tenderness or worsening of abdominal pains  Swelling of the abdomen that is new, acute  Fever of 100F or higher    For urgent or emergent issues, a gastroenterologist can be reached at any hour by calling [phone].   DIET:  We do recommend a small meal at first, but then you may proceed to your regular diet.  Drink plenty of fluids but you should avoid alcoholic beverages for 24 hours.  ACTIVITY:  You should plan to take it easy for the rest of today and you should NOT DRIVE or use heavy machinery until tomorrow (because  of the sedation medicines used during the test).    FOLLOW UP: Our staff will call the number listed on your records the next business day following your procedure to check on you and address any questions or concerns that you may have regarding the information given to you following your procedure. If we do not reach you, we will leave a message.  However, if you are feeling well and you are not experiencing any problems, there is no need to return our call.  We will assume that you have returned to your regular daily activities without incident.  If any biopsies were taken you will be contacted by phone or by letter within the next 1-3 weeks.  Please call us at [phone] if you have not heard about the biopsies in 3 weeks.    SIGNATURES/CONFIDENTIALITY: You and/or your care partner have signed paperwork which will be entered into your electronic medical record.  These signatures attest to the fact that that the information above on your After Visit Summary has been reviewed and is understood.  Full responsibility of the confidentiality of this discharge information lies with you and/or your care-partner.

## 2017-05-07 NOTE — Progress Notes (Signed)
Pt's states no medical or surgical changes since previsit or office visit. 

## 2017-05-07 NOTE — Op Note (Signed)
[facility] Patient Name: Sierra Sparks Procedure Date: 05/07/2017 9:02 AM MRN: [id number] Endoscopist: Docia Chuck. Henrene Pastor , MD Age: 77 Referring MD:  Date of Birth: May 19, 1940 Gender: Female Account #: [id number] Procedure:                Colonoscopy Indications:              High risk colon cancer surveillance: Personal                            history of non-advanced adenoma. Previous                            examinations 2003, 2007, 2013 Medicines:                Monitored Anesthesia Care Procedure:                Pre-Anesthesia Assessment:                           - Prior to the procedure, a History and Physical                            was performed, and patient medications and                            allergies were reviewed. The patient's tolerance of                            previous anesthesia was also reviewed. The risks                            and benefits of the procedure and the sedation                            options and risks were discussed with the patient.                            All questions were answered, and informed consent                            was obtained. Prior Anticoagulants: The patient has                            taken no previous anticoagulant or antiplatelet                            agents. ASA Grade Assessment: II - A patient with                            mild systemic disease. After reviewing the risks                            and benefits, the patient was deemed in  satisfactory condition to undergo the procedure.                           After obtaining informed consent, the colonoscope                            was passed under direct vision. Throughout the                            procedure, the patient's blood pressure, pulse, and                            oxygen saturations were monitored continuously. The                            Colonoscope was introduced through the  anus and                            advanced to the the cecum, identified by                            appendiceal orifice and ileocecal valve. The                            ileocecal valve, appendiceal orifice, and rectum                            were photographed. The quality of the bowel                            preparation was excellent. The colonoscopy was                            performed without difficulty. The patient tolerated                            the procedure well. The bowel preparation used was                            SUPREP. Scope In: 9:06:15 AM Scope Out: 9:23:26 AM Scope Withdrawal Time: 0 hours 13 minutes 22 seconds  Total Procedure Duration: 0 hours 17 minutes 11 seconds  Findings:                 A few diverticula were found in the rectum and                            sigmoid colon.                           Internal hemorrhoids were found during retroflexion.                           The exam was otherwise without abnormality on  direct and retroflexion views. Complications:            No immediate complications. Estimated blood loss:                            None. Estimated Blood Loss:     Estimated blood loss: none. Impression:               - Diverticulosis in the rectum and in the sigmoid                            colon.                           - Internal hemorrhoids.                           - The examination was otherwise normal on direct                            and retroflexion views.                           - No specimens collected. Recommendation:           - Repeat colonoscopy is not recommended for                            surveillance.                           - Patient has a contact number available for                            emergencies. The signs and symptoms of potential                            delayed complications were discussed with the                            patient. Return to  normal activities tomorrow.                            Written discharge instructions were provided to the                            patient.                           - Resume previous diet.                           - Continue present medications. Docia Chuck. Henrene Pastor, MD 05/07/2017 9:26:15 AM This report has been signed electronically.

## 2017-05-07 NOTE — Progress Notes (Signed)
To recovery, report to RN, VSS. 

## 2017-05-08 ENCOUNTER — Telehealth: Payer: Self-pay

## 2017-05-08 NOTE — Telephone Encounter (Signed)
  Follow up Call-  Call back number 05/07/2017  Post procedure Call Back phone  # [phone]  Permission to leave phone message Yes  Some recent data might be hidden     Patient questions:  Do you have a fever, pain , or abdominal swelling? No. Pain Score  0 *  Have you tolerated food without any problems? Yes.    Have you been able to return to your normal activities? Yes.    Do you have any questions about your discharge instructions: Diet   No. Medications  No. Follow up visit  No.  Do you have questions or concerns about your Care? No.  Actions: * If pain score is 4 or above: No action needed, pain <4.

## 2017-10-31 ENCOUNTER — Ambulatory Visit: Payer: Self-pay | Admitting: *Deleted

## 2017-10-31 ENCOUNTER — Ambulatory Visit: Payer: Medicare Other | Admitting: Family Medicine

## 2017-10-31 ENCOUNTER — Encounter: Payer: Self-pay | Admitting: Family Medicine

## 2017-10-31 DIAGNOSIS — L03116 Cellulitis of left lower limb: Secondary | ICD-10-CM | POA: Diagnosis not present

## 2017-10-31 DIAGNOSIS — L0390 Cellulitis, unspecified: Secondary | ICD-10-CM | POA: Insufficient documentation

## 2017-10-31 MED ORDER — DOXYCYCLINE HYCLATE 100 MG PO TABS: 100.0000 mg | ORAL_TABLET | Freq: Two times a day (BID) | ORAL | 0 refills | Status: DC

## 2017-10-31 NOTE — Telephone Encounter (Signed)
Patient is calling- she cut her leg 2 days ago in the garden and it is not getting better. Reason for Disposition . [1] Looks infected AND [2] large red area (>2 inches or 5 cm) or streak  Answer Assessment - Initial Assessment Questions 1. APPEARANCE of INJURY: "What does the injury look like?"      Red, hot and hard and tight- swollen 2. SIZE: "How large is the cut?"      Small cut- white spot 3. BLEEDING: "Is it bleeding now?" If so, ask: "Is it difficult to stop?"      Not now 4. LOCATION: "Where is the injury located?"      Behind left leg between knee and ankle 5. ONSET: "How long ago did the injury occur?"      2 days ago in the morning 6. MECHANISM: "Tell me how it happened."      Slipped and fell and cut leg on stick? Some thing stabbed her leg 7. TETANUS: "When was the last tetanus booster?"     current 8. PREGNANCY: "Is there any chance you are pregnant?" "When was your last menstrual period?"     n/a  Protocols used: [place]

## 2017-10-31 NOTE — Progress Notes (Signed)
Sierra Sparks - 77 y.o. female MRN [id number]  Date of birth: Aug 23, 1940  Subjective Chief Complaint  Patient presents with  . leg wound    2 days ago punctured leg warm, red and swollen.    HPI Sierra Sparks is a 77 y.o. female here today with complaint of L leg pain.  She was gardening a few days ago and stepped into a hole in her yard.  Felt something poke her in the back of the leg, thought maybe a stick but unsure.  Has small puncture wound to back of calf.  She began noticing redness with warmth and swelling yesterday with worsening into today.  She also reports that area is itchy.  She denies fever, chills, increased fatigue, drainage from wound.  She has not tried anything for treatment so far.    ROS:  A comprehensive ROS was completed and negative except as noted per HPI  No Known Allergies  Past Medical History:  Diagnosis Date  . Allergic rhinitis, cause unspecified   . Allergy   . Anemia   . Anxiety state, unspecified   . Cataract    forming   . Colon polyps   . Depressive disorder, not elsewhere classified   . Diffuse cystic mastopathy   . Disorder of bone and cartilage, unspecified   . Diverticulosis   . External hemorrhoids   . GERD (gastroesophageal reflux disease)   . Glaucoma    narrow angle thatwas corrected with laser surgery   . Osteoarthrosis, unspecified whether generalized or localized, unspecified site    hands  . Osteoporosis   . Pure hypercholesterolemia   . Thyroid disease    years past- no meds currently   . Unspecified essential hypertension   . Unspecified hemorrhoids without mention of complication     Past Surgical History:  Procedure Laterality Date  . BUNIONECTOMY  7/01   left   . CESAREAN SECTION  1981  . CHOLECYSTECTOMY    . COLONOSCOPY  10/03   polyp; hemorrhoids;diverticulosis  . COLONOSCOPY  2013   diverticulosis  . DEXA  3/02   osteopenia  . DEXA  1/05   Stable  . HAND SURGERY     DIP fusion-left  .  HEMORRHOID SURGERY    . POLYPECTOMY    . TONSILLECTOMY     childhood    Social History   Socioeconomic History  . Marital status: Married    Spouse name: Not on file  . Number of children: 2  . Years of education: Not on file  . Highest education level: Not on file  Occupational History    Employer: OTHER    Comment: [employer]  Social Needs  . Financial resource strain: Not on file  . Food insecurity:    Worry: Not on file    Inability: Not on file  . Transportation needs:    Medical: Not on file    Non-medical: Not on file  Tobacco Use  . Smoking status: Never Smoker  . Smokeless tobacco: Never Used  Substance and Sexual Activity  . Alcohol use: Yes    Alcohol/week: 0.0 oz    Comment: Occasional-wine  . Drug use: No  . Sexual activity: Yes  Lifestyle  . Physical activity:    Days per week: Not on file    Minutes per session: Not on file  . Stress: Not on file  Relationships  . Social connections:    Talks on phone: Not on file  Gets together: Not on file    Attends religious service: Not on file    Active member of club or organization: Not on file    Attends meetings of clubs or organizations: Not on file    Relationship status: Not on file  Other Topics Concern  . Not on file  Social History Narrative   Married      2 children      Works for [employer]    Family History  Problem Relation Age of Onset  . Colon cancer Brother        ? primary colon  . Arthritis Father   . Lung cancer Father        + smoker  . Stroke Mother   . Coronary artery disease Mother 82  . Stroke Maternal Grandmother        hemorrhagic  . Uterine cancer Maternal Grandmother   . Bone cancer Paternal Uncle   . Liver cancer Brother   . Esophageal cancer Neg Hx   . Rectal cancer Neg Hx   . Stomach cancer Neg Hx   . Breast cancer Neg Hx   . Colon polyps Neg Hx     Health Maintenance  Topic Date Due  . MAMMOGRAM  04/12/2017  . INFLUENZA VACCINE   11/13/2017  . COLONOSCOPY  05/07/2022  . TETANUS/TDAP  02/20/2026  . DEXA SCAN  Completed  . PNA vac Low Risk Adult  Completed    ----------------------------------------------------------------------------------------------------------------------------------------------------------------------------------------------------------------- Physical Exam BP 132/88 (BP Location: Left Arm, Patient Position: Sitting, Cuff Size: Normal)   Pulse 76   Temp 98.1 F (36.7 C) (Oral)   Ht 5\' 1"  (1.549 m)   Wt 167 lb 3.2 oz (75.8 kg)   SpO2 95%   BMI 31.59 kg/m   Physical Exam  Constitutional: She is oriented to person, place, and time. She appears well-nourished. No distress.  HENT:  Head: Normocephalic and atraumatic.  Mouth/Throat: Oropharynx is clear and moist.  Eyes: No scleral icterus.  Cardiovascular: Normal rate, regular rhythm and normal heart sounds.  Pulmonary/Chest: Effort normal and breath sounds normal.  Musculoskeletal: She exhibits no edema.  Neurological: She is alert and oriented to person, place, and time.  Skin:  Small wound to posterior L calf with surrounding erythema and warmth.  Area is tender to palpation.  There is no visible drainage or fluctuance.    Psychiatric: She has a normal mood and affect. Her behavior is normal.    ------------------------------------------------------------------------------------------------------------------------------------------------------------------------------------------------------------------- Assessment and Plan  Cellulitis -Tetanus is up to date  -Rx for doxycycline  -Area outlined, discussed if moving outside of drawn area or developing additional symptoms she should be re-evaluated.  -May use cool compress for comfort.   -Keep leg elevated.

## 2017-10-31 NOTE — Patient Instructions (Signed)

## 2017-10-31 NOTE — Assessment & Plan Note (Addendum)
-  Tetanus is up to date  -Rx for doxycycline  -Area outlined, discussed if moving outside of drawn area or developing additional symptoms she should be re-evaluated.  -May use cool compress for comfort.   -Keep leg elevated.

## 2017-11-26 IMAGING — MR MR BRAIN/IAC WO/W
8 of 12 series · 24 of 48 positions shown · IV contrast (multihance)
Comparison: None.

CLINICAL DATA: 75-year-old female with sudden onset right side
hearing loss 6 weeks ago, now with "roaring" sound. Asymmetric
sensorineural hearing loss.

EXAM:
MRI HEAD WITHOUT AND WITH CONTRAST
TECHNIQUE: Multiplanar, multiecho pulse sequences of the brain and surrounding
structures were obtained without and with intravenous contrast.
CONTRAST:  15mL MULTIHANCE GADOBENATE DIMEGLUMINE 529 MG/ML IV SOLN

[Series 2: T1 · sagittal · 5.0mm · 0.45mm/px · 4 of 27 slices shown (1 of 3)]
[im 1/27]
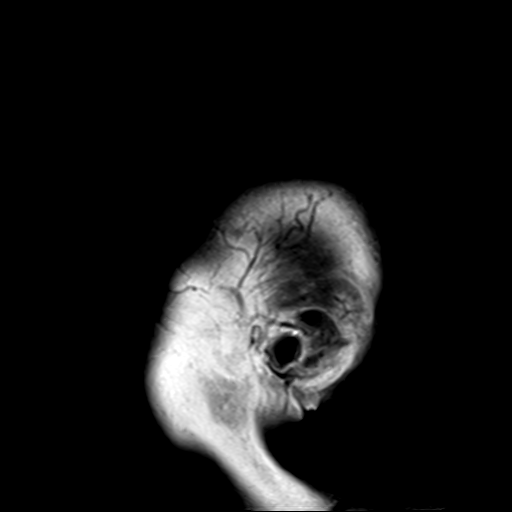
[im 9/27]
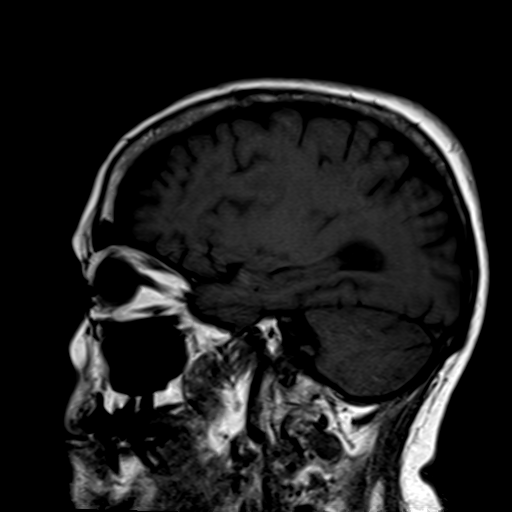
[im 18/27]
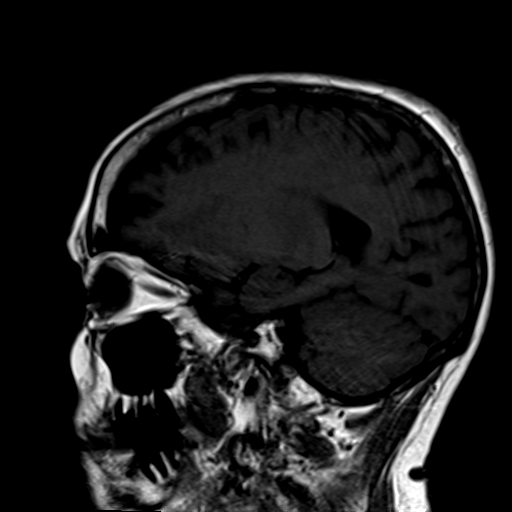
[im 27/27]
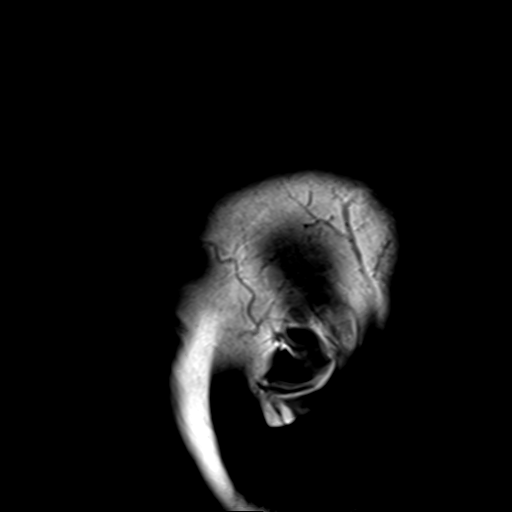

[Series 4: DWI · axial · 3.0mm · 1.80mm/px · z∈[-50,+109]mm · 5 of 41 slices shown]
[im 1/41]
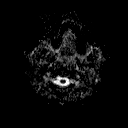
[im 11/41]
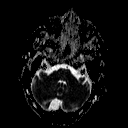
[im 21/41]
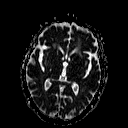
[im 31/41]
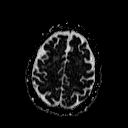
[im 41/41]
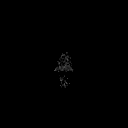

[Series 5: T2 · axial · 5.0mm · 0.60mm/px · z∈[-51,+105]mm · 2 of 25 slices shown (1 of 3)]
[im 1/25]
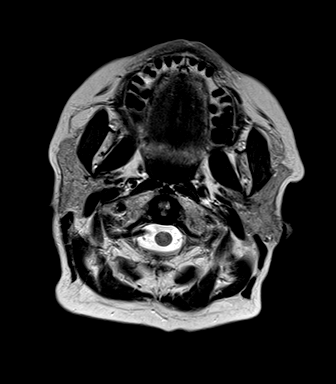
[im 25/25]
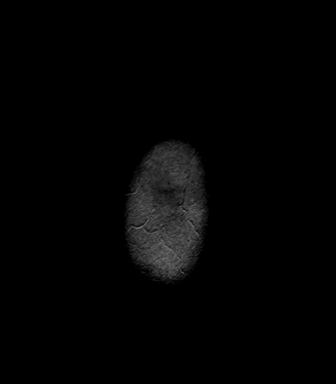

[Series 6: T2 · axial · 5.0mm · 0.45mm/px · z∈[-51,+105]mm · 2 of 25 slices shown (2 of 3)]
[im 1/25]
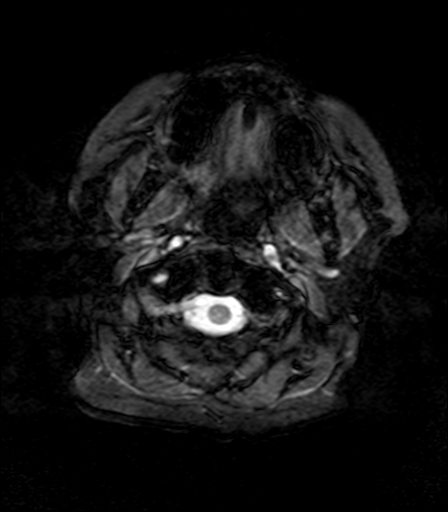
[im 25/25]
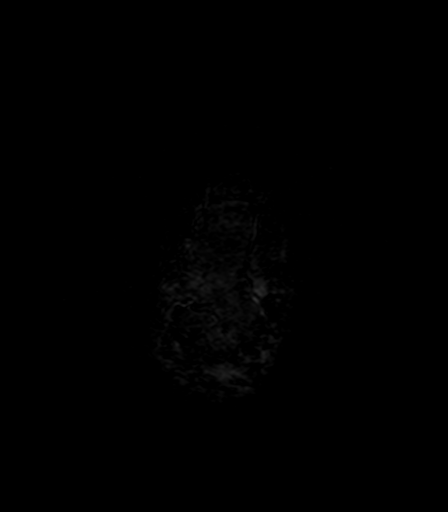

[Series 7: FLAIR · axial · 3.0mm · 0.45mm/px · z∈[-51,+104]mm · 5 of 53 slices shown]
[im 1/53]
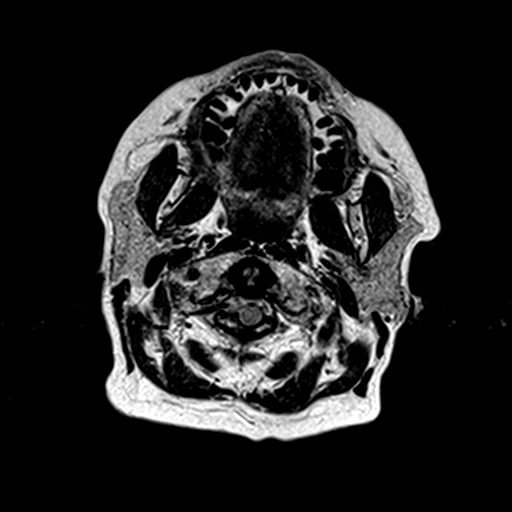
[im 14/53]
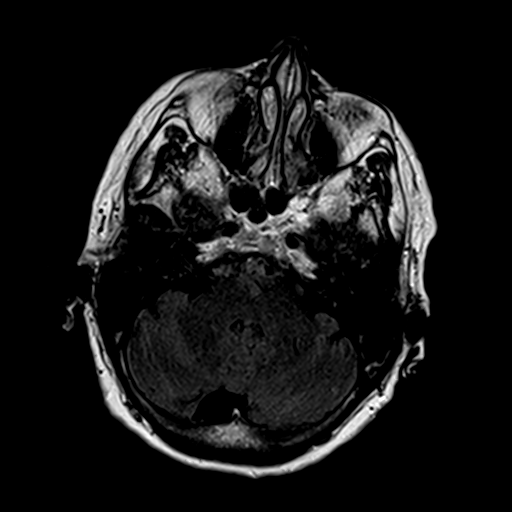
[im 27/53]
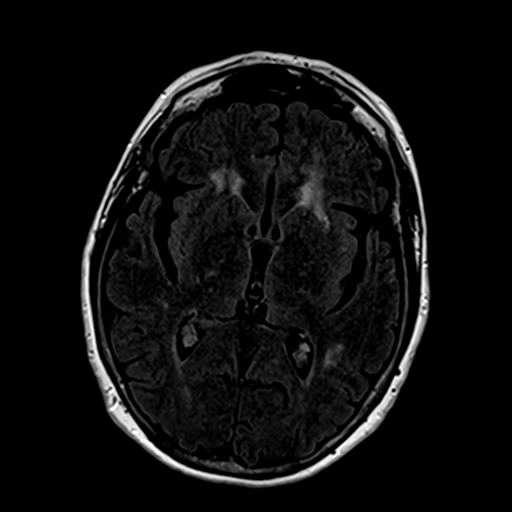
[im 40/53]
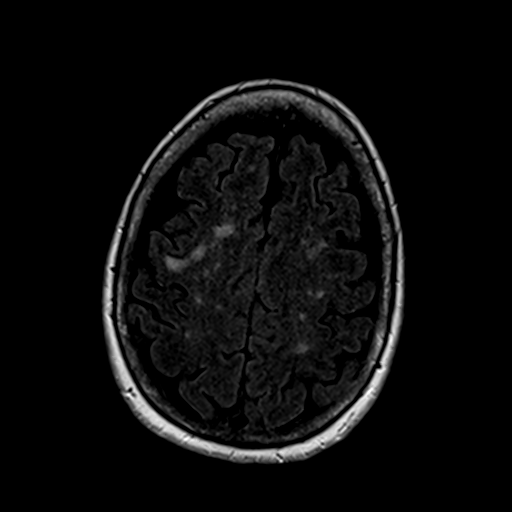
[im 53/53]
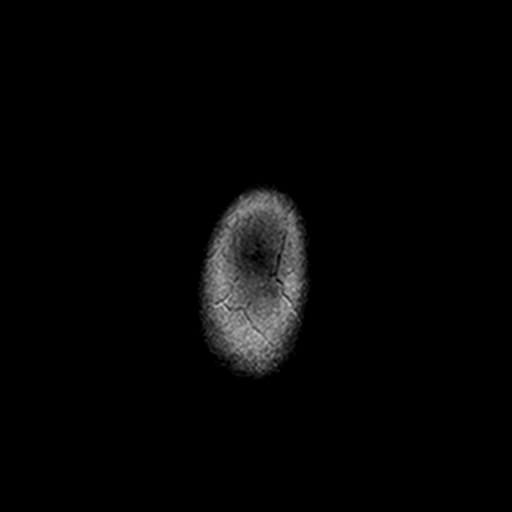

[Series 8: T2 · axial · 1.0mm · 0.35mm/px · z∈[-37,+2]mm · 4 of 40 slices shown (3 of 3)]
[im 1/40]
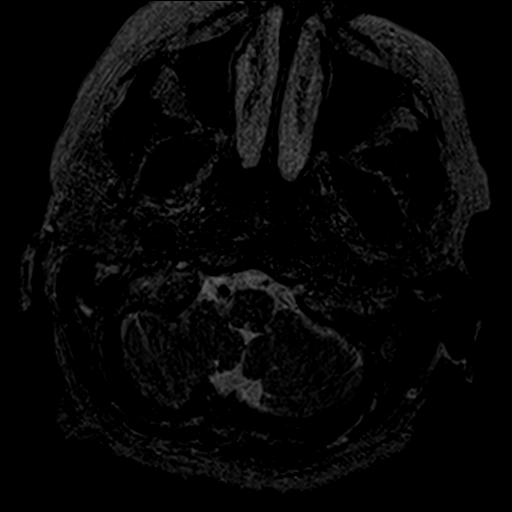
[im 14/40]
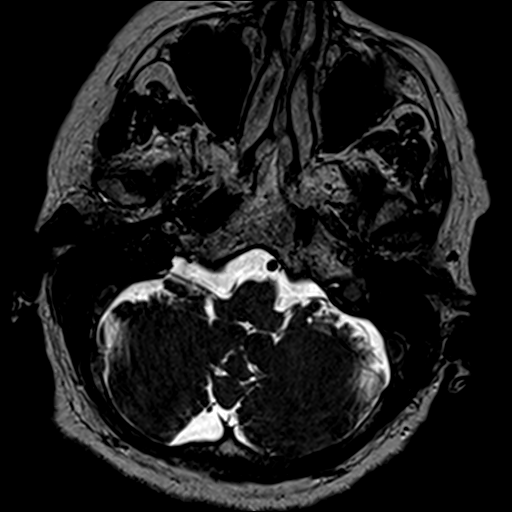
[im 27/40]
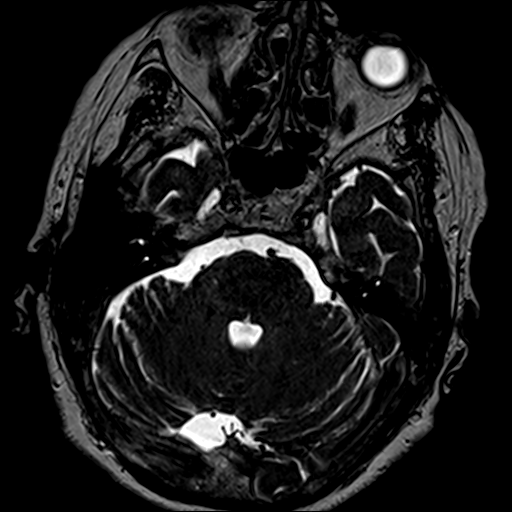
[im 40/40]
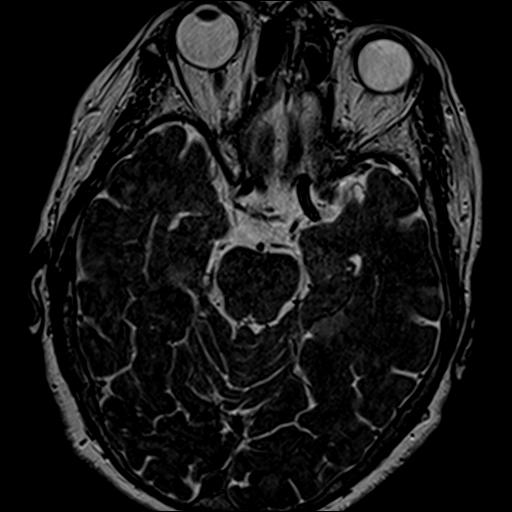

[Series 9: T1 · axial · 3.0mm · 0.39mm/px · 1 of 13 slices shown (2 of 3)]
[im 1/13]
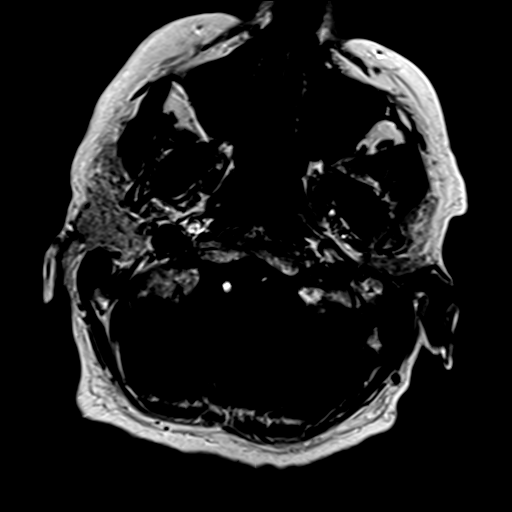

[Series 10: T1 · coronal · 3.0mm · 0.78mm/px · 1 of 13 slices shown (3 of 3)]
[im 1/13]
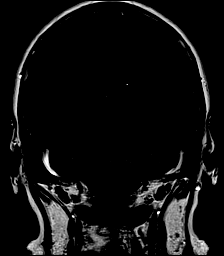

[24 of 48 positions shown; findings below may reference images not displayed]

FINDINGS: Brain: Cerebral volume is within normal limits for age. No
restricted diffusion to suggest acute infarction. No midline shift,
mass effect, evidence of mass lesion, ventriculomegaly, extra-axial
collection or acute intracranial hemorrhage. Cervicomedullary
junction and pituitary are within normal limits.

Multifocal Patchy and confluent abnormal cerebral white matter T2
and FLAIR hyperintensity in both hemispheres. There is some
posterior temporal lobe involvement. The deep white matter capsules
are largely spared. The corpus callosum appears normal. The
configuration is nonspecific. No cortical encephalomalacia or
chronic cerebral blood products are identified. Comparatively mild
T2 heterogeneity in the deep gray matter nuclei. Several
perivascular spaces are noted in the deep gray matter and midbrain.
Minimal T2 heterogeneity in the brainstem. Negative cerebellum. No
abnormal enhancement identified. No dural thickening.

Vascular: Major intracranial vascular flow voids are preserved
except for the distal left vertebral artery which is diminutive and
non dominant.

Skull and upper cervical spine: Negative. Normal bone marrow signal.

Sinuses/Orbits: Normal orbits soft tissues. Minimal ethmoid sinus
mucosal thickening. Negative scalp soft tissues.

Other: Dedicated internal auditory canal imaging. Normal
cerebellopontine angles. Normal bilateral cisternal and
intracanalicular 7th and 8th cranial nerve segments. Preserved T2
signal in the bilateral cochlea and vestibular structures. Bilateral
mastoid air cells are clear. Normal stylomastoid foramina. No
abnormal enhancement identified.

There is a 10 mm nodule of decreased T1 and T2 signal in the central
right parotid gland (series 5, image 3 and series 12, image 10, but
there is no associated enhancement, and this is not evident on
diffusion imaging. Both parotid glands otherwise appear normal.
IMPRESSION: 1. Negative IAC imaging, and no acute intracranial abnormality
identify.
2. Dominant right vertebral artery, and the diminutive distal left
vertebral artery appears stenotic or occluded.
3. Moderately advanced but nonspecific cerebral white matter signal
changes, most commonly due to chronic small vessel disease.
4. Ten mm nodular signal abnormality in the central right parotid
gland appears benign and might simply be a sialolith.

## 2018-04-02 ENCOUNTER — Ambulatory Visit (INDEPENDENT_AMBULATORY_CARE_PROVIDER_SITE_OTHER): Payer: Medicare Other

## 2018-04-02 DIAGNOSIS — Z23 Encounter for immunization: Secondary | ICD-10-CM | POA: Diagnosis not present

## 2018-05-03 ENCOUNTER — Telehealth: Payer: Self-pay | Admitting: Family Medicine

## 2018-05-03 DIAGNOSIS — I10 Essential (primary) hypertension: Secondary | ICD-10-CM

## 2018-05-03 DIAGNOSIS — E7800 Pure hypercholesterolemia, unspecified: Secondary | ICD-10-CM

## 2018-05-03 NOTE — Telephone Encounter (Signed)
-----   Message from Eustace Pen, LPN sent at [id number]/61/1643  3:55 PM EST ----- Regarding: Labs 1/21 Lab orders needed. Thank you.

## 2018-05-05 ENCOUNTER — Ambulatory Visit: Payer: Medicare Other

## 2018-05-08 ENCOUNTER — Ambulatory Visit: Payer: Medicare Other

## 2018-05-11 ENCOUNTER — Encounter: Payer: Self-pay | Admitting: Family Medicine

## 2018-05-11 ENCOUNTER — Ambulatory Visit (INDEPENDENT_AMBULATORY_CARE_PROVIDER_SITE_OTHER): Payer: Medicare Other | Admitting: Family Medicine

## 2018-05-11 ENCOUNTER — Telehealth: Payer: Self-pay | Admitting: Family Medicine

## 2018-05-11 VITALS — BP 158/88 | HR 62 | Temp 98.0°F | Ht 61.5 in | Wt 144.5 lb

## 2018-05-11 DIAGNOSIS — Z0000 Encounter for general adult medical examination without abnormal findings: Secondary | ICD-10-CM

## 2018-05-11 DIAGNOSIS — E7800 Pure hypercholesterolemia, unspecified: Secondary | ICD-10-CM | POA: Diagnosis not present

## 2018-05-11 DIAGNOSIS — E2839 Other primary ovarian failure: Secondary | ICD-10-CM

## 2018-05-11 DIAGNOSIS — I10 Essential (primary) hypertension: Secondary | ICD-10-CM

## 2018-05-11 DIAGNOSIS — F418 Other specified anxiety disorders: Secondary | ICD-10-CM

## 2018-05-11 DIAGNOSIS — Z1231 Encounter for screening mammogram for malignant neoplasm of breast: Secondary | ICD-10-CM

## 2018-05-11 DIAGNOSIS — R928 Other abnormal and inconclusive findings on diagnostic imaging of breast: Secondary | ICD-10-CM | POA: Insufficient documentation

## 2018-05-11 DIAGNOSIS — M8589 Other specified disorders of bone density and structure, multiple sites: Secondary | ICD-10-CM | POA: Diagnosis not present

## 2018-05-11 LAB — COMPREHENSIVE METABOLIC PANEL
ALT: 13 U/L (ref 0–35)
AST: 20 U/L (ref 0–37)
Albumin: 4.4 g/dL (ref 3.5–5.2)
Alkaline Phosphatase: 102 U/L (ref 39–117)
BUN: 22 mg/dL (ref 6–23)
CO2: 30 mEq/L (ref 19–32)
Calcium: 10 mg/dL (ref 8.4–10.5)
Chloride: 103 mEq/L (ref 96–112)
Creatinine, Ser: 0.76 mg/dL (ref 0.40–1.20)
GFR: 74.11 mL/min (ref >60.00)
Glucose, Bld: 101 mg/dL — ABNORMAL HIGH (ref 70–99)
POTASSIUM: 4.9 meq/L (ref 3.5–5.1)
Sodium: 139 mEq/L (ref 135–145)
TOTAL PROTEIN: 7.4 g/dL (ref 6.0–8.3)
Total Bilirubin: 0.5 mg/dL (ref 0.2–1.2)

## 2018-05-11 LAB — TSH: TSH: 2.23 u[IU]/mL (ref 0.35–4.50)

## 2018-05-11 LAB — CBC WITH DIFFERENTIAL/PLATELET
Basophils Absolute: 0 10*3/uL (ref 0.0–0.1)
Basophils Relative: 0.4 % (ref 0.0–3.0)
Eosinophils Absolute: 0 10*3/uL (ref 0.0–0.7)
Eosinophils Relative: 0.5 % (ref 0.0–5.0)
HCT: 40.9 % (ref 36.0–46.0)
HEMOGLOBIN: 13.6 g/dL (ref 12.0–15.0)
Lymphocytes Relative: 43.8 % (ref 12.0–46.0)
Lymphs Abs: 2 10*3/uL (ref 0.7–4.0)
MCHC: 33.3 g/dL (ref 30.0–36.0)
MCV: 94.4 fl (ref 78.0–100.0)
MONO ABS: 0.4 10*3/uL (ref 0.1–1.0)
Monocytes Relative: 7.6 % (ref 3.0–12.0)
Neutro Abs: 2.2 10*3/uL (ref 1.4–7.7)
Neutrophils Relative %: 47.7 % (ref 43.0–77.0)
Platelets: 245 10*3/uL (ref 150.0–400.0)
RBC: 4.34 Mil/uL (ref 3.87–5.11)
RDW: 13.9 % (ref 11.5–15.5)
WBC: 4.6 10*3/uL (ref 4.0–10.5)

## 2018-05-11 LAB — LIPID PANEL
CHOL/HDL RATIO: 4
Cholesterol: 207 mg/dL — ABNORMAL HIGH (ref 0–200)
HDL: 51.3 mg/dL (ref >39.00)
LDL Cholesterol: 132 mg/dL — ABNORMAL HIGH (ref 0–99)
NonHDL: 155.41
Triglycerides: 115 mg/dL (ref 0.0–149.0)
VLDL: 23 mg/dL (ref 0.0–40.0)

## 2018-05-11 MED ORDER — AMLODIPINE BESYLATE 5 MG PO TABS: 5.0000 mg | ORAL_TABLET | Freq: Every day | ORAL | 3 refills | Status: DC

## 2018-05-11 MED ORDER — FLUOXETINE HCL 20 MG PO CAPS: 20.0000 mg | ORAL_CAPSULE | Freq: Every day | ORAL | 3 refills | Status: DC

## 2018-05-11 NOTE — Assessment & Plan Note (Signed)
Reviewed health habits including diet and exercise and skin cancer prevention Reviewed appropriate screening tests for age  Also reviewed health mt list, fam hx and immunization status , as well as social and family history   Labs ordered today  Commended on wt loss  bp up - did not take medicine-will f/u for re check  dexa and mammogram ordered  No issues with memory Has adv directive -asked to bring a copy

## 2018-05-11 NOTE — Telephone Encounter (Signed)
Orders done Will route to [facility]

## 2018-05-11 NOTE — Assessment & Plan Note (Signed)
Labs today  Expect improvement from last draw due to great diet  Disc goals for lipids and reasons to control them Rev last labs with pt Rev low sat fat diet in detail

## 2018-05-11 NOTE — Assessment & Plan Note (Signed)
Scheduled annual screening mammogram Nl breast exam today  Encouraged monthly self exams   

## 2018-05-11 NOTE — Assessment & Plan Note (Signed)
Due for dexa -scheduled  On ca and D Good exercise  No falls or fx

## 2018-05-11 NOTE — Assessment & Plan Note (Signed)
BP is up - pt did not take bp med today  BP: (!) 158/88    Will f/u in 1 mo to re check this on medication  No c/o  Good health habits  Per pt bp has been ok at home

## 2018-05-11 NOTE — Patient Instructions (Addendum)
Schedule mammogram on the way out (also dexa)   BP is up because you have not taken your medicine today   Keep taking good care of yourself  Keep your body and brain active

## 2018-05-11 NOTE — Progress Notes (Signed)
Subjective:    Patient ID: Sierra Sparks, female    DOB: 1940-08-14, 78 y.o.   MRN: [id number]  HPI Here for amw and annual exam with rev of chronic health problems   I have personally reviewed the Medicare Annual Wellness questionnaire and have noted 1. The patient's medical and social history 2. Their use of alcohol, tobacco or illicit drugs 3. Their current medications and supplements 4. The patient's functional ability including ADL's, fall risks, home safety risks and hearing or visual             impairment. 5. Diet and physical activities 6. Evidence for depression or mood disorders  The patients weight, height, BMI have been recorded in the chart and visual acuity is per eye clinic.  I have made referrals, counseling and provided education to the patient based review of the above and I have provided the pt with a written personalized care plan for preventive services. Reviewed and updated provider list, see scanned forms.  See scanned forms.  Routine anticipatory guidance given to patient.  See health maintenance. Colon cancer screening colonoscopy 1/19 with 5 y recall  Brother had colon cancer  Breast cancer screening mammogram 12/17 -needs one set up  Goes to [facility]  Self breast exam-no lumps  Flu vaccine 12/19 Tetanus vaccine 11/17 Pneumovax completed series Zoster vaccine-zostavax 2/12 dexa 10/16  Osteopenia = due for re check - we will schedule that  On ca and D Works out  No falls or fractures  Advance directive--has living will and POA (husband Ron)  Cognitive function addressed- see scanned forms- and if abnormal then additional documentation follows.  No memory concerns (occ word finding)   PMH and SH reviewed  Meds, vitals, and allergies reviewed.   ROS: See HPI.  Otherwise negative.     Hearing Screening Comments: Patient has bilateral hearing aids in. Vision Screening Comments: Saw Dr. Matilde Sprang in November 2019 for eye exam.  Wt Readings from  Last 3 Encounters:  05/11/18 144 lb 8 oz (65.5 kg)  10/31/17 167 lb 3.2 oz (75.8 kg)  05/07/17 168 lb (76.2 kg)  smaller portions Wants to loose 10 more lb (that is her comfortable weight)  Still goes to the gym at least 3 d per week  26.86 kg/m   bp is high today-has not taken bp medicine  No cp or palpitations or headaches or edema  No side effects to medicines  BP Readings from Last 3 Encounters:  05/11/18 (!) 158/88  10/31/17 132/88  05/07/17 (!) 150/81    bp is fine at home    Hyperlipidemia  Due for labs Lab Results  Component Value Date   CHOL 224 (H) 03/31/2017   HDL 49.90 03/31/2017   LDLCALC 138 (H) 03/31/2017   LDLDIRECT 143.2 08/05/2012   TRIG 182.0 (H) 03/31/2017   CHOLHDL 4 03/31/2017  thinks it will be better this time-eating much better  Avoids grease and red meat and fatty pork  No fried food  Not a lot of cheese or ice cream   Has been on cholesterol medicine in the past- was able to stop it  ? Which one she was on   Patient Active Problem List   Diagnosis Date Noted  . Abnormal mammogram 05/11/2018  . Laceration of skin of right hand 04/25/2017  . Encounter for screening mammogram for breast cancer 03/31/2017  . Hearing loss in right ear 08/30/2016  . Anal fissure 03/06/2016  . Screening mammogram, encounter for 02/26/2016  .  Internal hemorrhoids 02/26/2016  . Routine general medical examination at a health care facility 02/03/2015  . Estrogen deficiency 02/03/2015  . Encounter for Medicare annual wellness exam 08/12/2012  . Glaucoma 08/12/2012  . Other screening mammogram 05/15/2011  . History of colon polyps 05/15/2011  . ALLERGIC RHINITIS 03/02/2008  . Osteopenia 01/05/2007  . HYPERCHOLESTEROLEMIA 10/24/2006  . ANXIETY 10/24/2006  . Depression with anxiety 10/24/2006  . Essential hypertension 10/24/2006  . HEMORRHOIDS 10/24/2006  . FIBROCYSTIC BREAST DISEASE 10/24/2006  . OSTEOARTHRITIS 10/24/2006   Past Medical History:  Diagnosis  Date  . Allergic rhinitis, cause unspecified   . Allergy   . Anemia   . Anxiety state, unspecified   . Cataract    forming   . Colon polyps   . Depressive disorder, not elsewhere classified   . Diffuse cystic mastopathy   . Disorder of bone and cartilage, unspecified   . Diverticulosis   . External hemorrhoids   . GERD (gastroesophageal reflux disease)   . Glaucoma    narrow angle thatwas corrected with laser surgery   . Osteoarthrosis, unspecified whether generalized or localized, unspecified site    hands  . Osteoporosis   . Pure hypercholesterolemia   . Thyroid disease    years past- no meds currently   . Unspecified essential hypertension   . Unspecified hemorrhoids without mention of complication    Past Surgical History:  Procedure Laterality Date  . BUNIONECTOMY  7/01   left   . CESAREAN SECTION  1981  . CHOLECYSTECTOMY    . COLONOSCOPY  10/03   polyp; hemorrhoids;diverticulosis  . COLONOSCOPY  2013   diverticulosis  . DEXA  3/02   osteopenia  . DEXA  1/05   Stable  . HAND SURGERY     DIP fusion-left  . HEMORRHOID SURGERY    . POLYPECTOMY    . TONSILLECTOMY     childhood   Social History   Tobacco Use  . Smoking status: Never Smoker  . Smokeless tobacco: Never Used  Substance Use Topics  . Alcohol use: Yes    Alcohol/week: 0.0 standard drinks    Comment: Occasional-wine  . Drug use: No   Family History  Problem Relation Age of Onset  . Colon cancer Brother        ? primary colon  . Arthritis Father   . Lung cancer Father        + smoker  . Stroke Mother   . Coronary artery disease Mother 63  . Stroke Maternal Grandmother        hemorrhagic  . Uterine cancer Maternal Grandmother   . Bone cancer Paternal Uncle   . Liver cancer Brother   . Esophageal cancer Neg Hx   . Rectal cancer Neg Hx   . Stomach cancer Neg Hx   . Breast cancer Neg Hx   . Colon polyps Neg Hx    No Known Allergies Current Outpatient Medications on File Prior to  Visit  Medication Sig Dispense Refill  . Calcium Carbonate-Vit D-Min (CALCIUM 1200) 1200-1000 MG-UNIT CHEW Chew 1 tablet by mouth 2 (two) times daily.     . Cetirizine HCl 10 MG CAPS Take 1 capsule by mouth daily.    Marland Kitchen KRILL OIL PO Take 1 capsule by mouth daily.    . Multiple Vitamin (MULTIVITAMIN) tablet Take 1 tablet by mouth daily.    . naproxen (NAPROSYN) 500 MG tablet Take 250 mg by mouth daily.      Current Facility-Administered Medications  on File Prior to Visit  Medication Dose Route Frequency Provider Last Rate Last Dose  . 0.9 %  sodium chloride infusion  500 mL Intravenous Once Irene Shipper, MD         Review of Systems  Constitutional: Negative for activity change, appetite change, fatigue, fever and unexpected weight change.  HENT: Negative for congestion, ear pain, rhinorrhea, sinus pressure and sore throat.   Eyes: Negative for pain, redness and visual disturbance.  Respiratory: Negative for cough, shortness of breath and wheezing.   Cardiovascular: Negative for chest pain and palpitations.  Gastrointestinal: Negative for abdominal pain, blood in stool, constipation and diarrhea.  Endocrine: Negative for polydipsia and polyuria.  Genitourinary: Negative for dysuria, frequency and urgency.  Musculoskeletal: Negative for arthralgias, back pain and myalgias.       Aches and pains   Skin: Negative for pallor and rash.  Allergic/Immunologic: Negative for environmental allergies.  Neurological: Negative for dizziness, syncope and headaches.  Hematological: Negative for adenopathy. Does not bruise/bleed easily.  Psychiatric/Behavioral: Negative for decreased concentration and dysphoric mood. The patient is not nervous/anxious.        Stressors        Objective:   Physical Exam Constitutional:      General: She is not in acute distress.    Appearance: Normal appearance. She is well-developed and normal weight. She is not ill-appearing.  HENT:     Head: Normocephalic and  atraumatic.     Right Ear: Tympanic membrane, ear canal and external ear normal.     Left Ear: Tympanic membrane, ear canal and external ear normal.     Nose: Nose normal.     Mouth/Throat:     Mouth: Mucous membranes are moist.     Pharynx: Oropharynx is clear.  Eyes:     General: No scleral icterus.    Conjunctiva/sclera: Conjunctivae normal.     Pupils: Pupils are equal, round, and reactive to light.  Neck:     Musculoskeletal: Normal range of motion and neck supple. No neck rigidity or muscular tenderness.     Thyroid: No thyromegaly.     Vascular: No carotid bruit or JVD.  Cardiovascular:     Rate and Rhythm: Normal rate and regular rhythm.     Pulses: Normal pulses.     Heart sounds: Normal heart sounds. No gallop.   Pulmonary:     Effort: Pulmonary effort is normal. No respiratory distress.     Breath sounds: Normal breath sounds. No wheezing.  Chest:     Chest wall: No tenderness.  Abdominal:     General: Bowel sounds are normal. There is no distension or abdominal bruit.     Palpations: Abdomen is soft. There is no mass.     Tenderness: There is no abdominal tenderness.  Genitourinary:    Comments: Breast exam: No mass, nodules, thickening, tenderness, bulging, retraction, inflamation, nipple discharge or skin changes noted.  No axillary or clavicular LA.     Musculoskeletal: Normal range of motion.        General: No tenderness.     Right lower leg: No edema.     Left lower leg: No edema.     Comments: No kyphosis   Lymphadenopathy:     Cervical: No cervical adenopathy.  Skin:    General: Skin is warm and dry.     Coloration: Skin is not pale.     Findings: No erythema or rash.     Comments: Solar lentigines diffusely  Some sks   Neurological:     General: No focal deficit present.     Mental Status: She is alert.     Cranial Nerves: No cranial nerve deficit.     Motor: No abnormal muscle tone.     Coordination: Coordination normal.     Deep Tendon Reflexes:  Reflexes are normal and symmetric. Reflexes normal.  Psychiatric:        Mood and Affect: Mood normal.           Assessment & Plan:   Problem List Items Addressed This Visit      Cardiovascular and Mediastinum   Essential hypertension    BP is up - pt did not take bp med today  BP: (!) 158/88    Will f/u in 1 mo to re check this on medication  No c/o  Good health habits  Per pt bp has been ok at home       Relevant Medications   amLODipine (NORVASC) 5 MG tablet     Musculoskeletal and Integument   Osteopenia    Due for dexa -scheduled  On ca and D Good exercise  No falls or fx         Other   HYPERCHOLESTEROLEMIA    Labs today  Expect improvement from last draw due to great diet  Disc goals for lipids and reasons to control them Rev last labs with pt Rev low sat fat diet in detail       Relevant Medications   amLODipine (NORVASC) 5 MG tablet   Depression with anxiety    Continues to do well with fluoxetine  Reviewed stressors/ coping techniques/symptoms/ support sources/ tx options and side effects in detail today       Relevant Medications   FLUoxetine (PROZAC) 20 MG capsule   Encounter for Medicare annual wellness exam - Primary    Reviewed health habits including diet and exercise and skin cancer prevention Reviewed appropriate screening tests for age  Also reviewed health mt list, fam hx and immunization status , as well as social and family history   Labs ordered today  Commended on wt loss  bp up - did not take medicine-will f/u for re check  dexa and mammogram ordered  No issues with memory Has adv directive -asked to bring a copy      Routine general medical examination at a health care facility    Reviewed health habits including diet and exercise and skin cancer prevention Reviewed appropriate screening tests for age  Also reviewed health mt list, fam hx and immunization status , as well as social and family history   Labs ordered  today  Commended on wt loss  bp up - did not take medicine-will f/u for re check  dexa and mammogram ordered  No issues with memory Has adv directive -asked to bring a copy      Estrogen deficiency   Relevant Orders   DG Bone Density   Screening mammogram, encounter for    Scheduled annual screening mammogram Nl breast exam today  Encouraged monthly self exams        Relevant Orders   MM 3D SCREEN BREAST BILATERAL

## 2018-05-11 NOTE — Assessment & Plan Note (Signed)
Continues to do well with fluoxetine  Reviewed stressors/ coping techniques/symptoms/ support sources/ tx options and side effects in detail today

## 2018-05-11 NOTE — Telephone Encounter (Signed)
Patient came up front to schedule her Screening Mammogram and Bone Density.  I called [facility] to schedule appointments.  [facility] said when patient had her Mammogram in 2018 she was suppose to come back in 6 months for a follow up Diagnostic Mammogram and Ultrasound.  They said a new order needs to be put in Epic before they can schedule appointments. IMG codes they need are Diagnostic Mammogram-IMG5535, Ultrasound-Left-IMG5531 and Ultrasound-Right-IMG5532. I let patient know we would call her back to schedule appointments when new orders are put in.

## 2018-05-12 ENCOUNTER — Encounter: Payer: Self-pay | Admitting: *Deleted

## 2018-05-12 NOTE — Telephone Encounter (Signed)
Tried calling pt  No answer °

## 2018-05-13 NOTE — Telephone Encounter (Signed)
Left message asking pt to call office regarding mammogram appointment.   Transfer to [place].  If im not here anyone up front can help pt

## 2018-05-13 NOTE — Telephone Encounter (Signed)
Pt returned call to [place].

## 2018-05-13 NOTE — Telephone Encounter (Signed)
Appointment 3/19 Pt wanted to have bone density and mammogram same day.  She was aware it would be march.  I ask pt if she wanted me to schedule her mammogram to get it sooner pt declined wanted both on same day

## 2018-07-02 ENCOUNTER — Ambulatory Visit
Admission: RE | Admit: 2018-07-02 | Discharge: 2018-07-02 | Disposition: A | Discharge status: Home / Self Care | Payer: Medicare Other | Source: Ambulatory Visit | Attending: Family Medicine | Admitting: Family Medicine

## 2018-07-02 ENCOUNTER — Other Ambulatory Visit: Payer: Self-pay

## 2018-07-02 DIAGNOSIS — R928 Other abnormal and inconclusive findings on diagnostic imaging of breast: Secondary | ICD-10-CM

## 2018-07-02 DIAGNOSIS — E2839 Other primary ovarian failure: Secondary | ICD-10-CM | POA: Insufficient documentation

## 2018-07-03 ENCOUNTER — Telehealth: Payer: Self-pay | Admitting: *Deleted

## 2018-07-03 ENCOUNTER — Encounter: Payer: Self-pay | Admitting: *Deleted

## 2018-07-03 NOTE — Telephone Encounter (Signed)
error 

## 2018-07-23 ENCOUNTER — Other Ambulatory Visit: Payer: Self-pay

## 2018-07-23 ENCOUNTER — Telehealth: Payer: Self-pay

## 2018-07-23 ENCOUNTER — Encounter: Payer: Self-pay | Admitting: Family Medicine

## 2018-07-23 ENCOUNTER — Ambulatory Visit: Payer: Medicare Other | Admitting: Family Medicine

## 2018-07-23 VITALS — BP 130/80 | HR 58 | Temp 98.2°F | Ht 61.5 in | Wt 142.1 lb

## 2018-07-23 DIAGNOSIS — N898 Other specified noninflammatory disorders of vagina: Secondary | ICD-10-CM | POA: Diagnosis not present

## 2018-07-23 MED ORDER — FLUCONAZOLE 150 MG PO TABS: 150.0000 mg | ORAL_TABLET | Freq: Once | ORAL | 0 refills | Status: AC

## 2018-07-23 MED ORDER — VALACYCLOVIR HCL 1 G PO TABS: 1000.0000 mg | ORAL_TABLET | Freq: Three times a day (TID) | ORAL | 0 refills | Status: DC

## 2018-07-23 NOTE — Telephone Encounter (Signed)
Inbound call to triage - patient reports concern with painful, itching vaginal discharge. Discharge is clear in color. Pain: 3/10. Onset: approx. 2 wks ago. Clitoris is sore and tender. Applied TAO to vagina x2 days. Denies douching.   Office visit scheduled 07/23/18 @ 1000.   COVID-19 screening negative.

## 2018-07-23 NOTE — Progress Notes (Addendum)
Subjective:    Patient ID: Sierra Sparks, female    DOB: 1940/06/06, 78 y.o.   MRN: [id number]  HPI  Here for symptoms of vaginitis  Symptoms started last week some time  Clitoris was very sore , ? Swollen Now itching and soreness   Vaginal d/c is clear-not really more than usual Bad odors   Not worried about STDs   Itch is on the outside as well as the inside No rash seen  No cramping   Otc: has not tried anything  She uses st ives bath soap in the area  Results for orders placed or performed in visit on 07/23/18  POCT Wet Prep [facility])  Result Value Ref Range   Source Wet Prep POC vaginal    WBC, Wet Prep HPF POC none    Bacteria Wet Prep HPF POC None (A) Few   BACTERIA WET PREP MORPHOLOGY POC     Clue Cells Wet Prep HPF POC None None   Clue Cells Wet Prep Whiff POC     Yeast Wet Prep HPF POC Few (A) None   KOH Wet Prep POC None None   Trichomonas Wet Prep HPF POC Absent Absent     Patient Active Problem List   Diagnosis Date Noted  . Vaginal lesion 07/23/2018  . Vaginal itching 07/23/2018  . Abnormal mammogram 05/11/2018  . Laceration of skin of right hand 04/25/2017  . Encounter for screening mammogram for breast cancer 03/31/2017  . Hearing loss in right ear 08/30/2016  . Anal fissure 03/06/2016  . Screening mammogram, encounter for 02/26/2016  . Internal hemorrhoids 02/26/2016  . Routine general medical examination at a health care facility 02/03/2015  . Estrogen deficiency 02/03/2015  . Encounter for Medicare annual wellness exam 08/12/2012  . Glaucoma 08/12/2012  . Other screening mammogram 05/15/2011  . History of colon polyps 05/15/2011  . ALLERGIC RHINITIS 03/02/2008  . Osteopenia 01/05/2007  . HYPERCHOLESTEROLEMIA 10/24/2006  . ANXIETY 10/24/2006  . Depression with anxiety 10/24/2006  . Essential hypertension 10/24/2006  . HEMORRHOIDS 10/24/2006  . FIBROCYSTIC BREAST DISEASE 10/24/2006  . OSTEOARTHRITIS 10/24/2006   Past Medical  History:  Diagnosis Date  . Allergic rhinitis, cause unspecified   . Allergy   . Anemia   . Anxiety state, unspecified   . Cataract    forming   . Colon polyps   . Depressive disorder, not elsewhere classified   . Diffuse cystic mastopathy   . Disorder of bone and cartilage, unspecified   . Diverticulosis   . External hemorrhoids   . GERD (gastroesophageal reflux disease)   . Glaucoma    narrow angle thatwas corrected with laser surgery   . Osteoarthrosis, unspecified whether generalized or localized, unspecified site    hands  . Osteoporosis   . Pure hypercholesterolemia   . Thyroid disease    years past- no meds currently   . Unspecified essential hypertension   . Unspecified hemorrhoids without mention of complication    Past Surgical History:  Procedure Laterality Date  . BUNIONECTOMY  7/01   left   . CESAREAN SECTION  1981  . CHOLECYSTECTOMY    . COLONOSCOPY  10/03   polyp; hemorrhoids;diverticulosis  . COLONOSCOPY  2013   diverticulosis  . DEXA  3/02   osteopenia  . DEXA  1/05   Stable  . HAND SURGERY     DIP fusion-left  . HEMORRHOID SURGERY    . POLYPECTOMY    . TONSILLECTOMY  childhood   Social History   Tobacco Use  . Smoking status: Never Smoker  . Smokeless tobacco: Never Used  Substance Use Topics  . Alcohol use: Yes    Alcohol/week: 0.0 standard drinks    Comment: Occasional-wine  . Drug use: No   Family History  Problem Relation Age of Onset  . Colon cancer Brother        ? primary colon  . Arthritis Father   . Lung cancer Father        + smoker  . Stroke Mother   . Coronary artery disease Mother 14  . Stroke Maternal Grandmother        hemorrhagic  . Uterine cancer Maternal Grandmother   . Bone cancer Paternal Uncle   . Liver cancer Brother   . Esophageal cancer Neg Hx   . Rectal cancer Neg Hx   . Stomach cancer Neg Hx   . Breast cancer Neg Hx   . Colon polyps Neg Hx    No Known Allergies Current Outpatient Medications  on File Prior to Visit  Medication Sig Dispense Refill  . amLODipine (NORVASC) 5 MG tablet Take 1 tablet (5 mg total) by mouth daily. 90 tablet 3  . Calcium Carbonate-Vit D-Min (CALCIUM 1200) 1200-1000 MG-UNIT CHEW Chew 1 tablet by mouth 2 (two) times daily.     . Cetirizine HCl 10 MG CAPS Take 1 capsule by mouth daily.    Marland Kitchen FLUoxetine (PROZAC) 20 MG capsule Take 1 capsule (20 mg total) by mouth daily. 90 capsule 3  . KRILL OIL PO Take 1 capsule by mouth daily.    . Multiple Vitamin (MULTIVITAMIN) tablet Take 1 tablet by mouth daily.    . naproxen (NAPROSYN) 500 MG tablet Take 250 mg by mouth daily.      No current facility-administered medications on file prior to visit.     Review of Systems  Constitutional: Negative for activity change, appetite change, fatigue, fever and unexpected weight change.  HENT: Negative for congestion, ear pain, rhinorrhea, sinus pressure and sore throat.   Eyes: Negative for pain, redness and visual disturbance.  Respiratory: Negative for cough, shortness of breath and wheezing.   Cardiovascular: Negative for chest pain and palpitations.  Gastrointestinal: Negative for abdominal pain, blood in stool, constipation and diarrhea.  Endocrine: Negative for polydipsia and polyuria.  Genitourinary: Positive for vaginal discharge and vaginal pain. Negative for decreased urine volume, dysuria, frequency and urgency.       Vaginal discomfort   Musculoskeletal: Negative for arthralgias, back pain and myalgias.  Skin: Negative for pallor and rash.  Allergic/Immunologic: Negative for environmental allergies.  Neurological: Negative for dizziness, syncope and headaches.  Hematological: Negative for adenopathy. Does not bruise/bleed easily.  Psychiatric/Behavioral: Negative for decreased concentration and dysphoric mood. The patient is not nervous/anxious.        Objective:   Physical Exam Constitutional:      General: She is not in acute distress.    Appearance:  Normal appearance. She is normal weight. She is not ill-appearing.  HENT:     Head: Normocephalic.     Mouth/Throat:     Mouth: Mucous membranes are moist.     Pharynx: Oropharynx is clear. No oropharyngeal exudate or posterior oropharyngeal erythema.  Eyes:     General: No scleral icterus.       Right eye: No discharge.        Left eye: No discharge.     Conjunctiva/sclera: Conjunctivae normal.  Pupils: Pupils are equal, round, and reactive to light.  Neck:     Musculoskeletal: No muscular tenderness.  Cardiovascular:     Rate and Rhythm: Regular rhythm. Bradycardia present.     Heart sounds: Normal heart sounds.  Pulmonary:     Effort: Pulmonary effort is normal. No respiratory distress.     Breath sounds: No wheezing.  Abdominal:     General: Abdomen is flat. Bowel sounds are normal.     Palpations: Abdomen is soft.     Tenderness: There is no abdominal tenderness.     Comments: No suprapubic tenderness or fullness    Genitourinary:    Vagina: No vaginal discharge.     Comments: Several areas to L of urethra and clitoris are erythematous (not ulcerated and not obviously vesicles)  No swelling or rash noted  Nl appearing urethra and introitus  No excoriations or vaginal d/c  No odor   Musculoskeletal:        General: No swelling.  Lymphadenopathy:     Cervical: No cervical adenopathy.  Neurological:     Mental Status: She is alert.  Psychiatric:        Mood and Affect: Mood normal.           Assessment & Plan:   Problem List Items Addressed This Visit      Musculoskeletal and Integument   Vaginal itching    With few hyphae on wet prep  Will tx for yeast with diflucan Also saw several lesions on L side of clitoris and urethra (red/ not ulcers)  Cannot r/o early shingles but it is difficult to tell  Given a px for valtrex to hold and fill if she notices more pain or increase in lesions (will use a mirror to examine and track herself) , and enc to let us  know        Relevant Orders   POCT Wet Prep [facility]) (Completed)     Other   Vaginal lesion - Primary    L clitoris and urethra area  Pt will watch- cannot r/o shingles (watch for ext of lesions or rash on vulva/leg/back)  Holding px for valtrex she will let us know  No worries of stds ? If possible trauma Update if not starting to improve in a week or if worsening

## 2018-07-23 NOTE — Patient Instructions (Signed)
There are some red spots on the vulva in area of discomfort I cannot entirely rule out shingles  Watch for worse pain /rash/ extension of lesions onto left leg (only on the left side)- if worse please fill px for valtrex and take as directed and let me know   You may also have a mild yeast infection  Take the diflucan as directed

## 2018-07-26 LAB — POCT WET PREP (WET MOUNT): Trichomonas Wet Prep HPF POC: ABSENT

## 2018-07-26 NOTE — Assessment & Plan Note (Signed)
L clitoris and urethra area  Pt will watch- cannot r/o shingles (watch for ext of lesions or rash on vulva/leg/back)  Holding px for valtrex she will let us know  No worries of stds ? If possible trauma Update if not starting to improve in a week or if worsening

## 2018-07-26 NOTE — Assessment & Plan Note (Signed)
With few hyphae on wet prep  Will tx for yeast with diflucan Also saw several lesions on L side of clitoris and urethra (red/ not ulcers)  Cannot r/o early shingles but it is difficult to tell  Given a px for valtrex to hold and fill if she notices more pain or increase in lesions (will use a mirror to examine and track herself) , and enc to let us know

## 2019-05-03 ENCOUNTER — Telehealth: Payer: Self-pay | Admitting: Family Medicine

## 2019-05-03 DIAGNOSIS — E7800 Pure hypercholesterolemia, unspecified: Secondary | ICD-10-CM

## 2019-05-03 DIAGNOSIS — I10 Essential (primary) hypertension: Secondary | ICD-10-CM

## 2019-05-03 NOTE — Telephone Encounter (Signed)
-----   Message from Ellamae Sia sent at 04/28/2019  9:25 AM EST ----- Regarding: lab orders for Thursday, 1.21.21  AWV lab orders, please.

## 2019-05-06 ENCOUNTER — Other Ambulatory Visit (INDEPENDENT_AMBULATORY_CARE_PROVIDER_SITE_OTHER): Payer: Medicare PPO

## 2019-05-06 ENCOUNTER — Ambulatory Visit: Payer: Medicare Other

## 2019-05-06 DIAGNOSIS — I10 Essential (primary) hypertension: Secondary | ICD-10-CM | POA: Diagnosis not present

## 2019-05-06 DIAGNOSIS — E7800 Pure hypercholesterolemia, unspecified: Secondary | ICD-10-CM

## 2019-05-06 LAB — CBC WITH DIFFERENTIAL/PLATELET
Basophils Absolute: 0 10*3/uL (ref 0.0–0.1)
Basophils Relative: 0.7 % (ref 0.0–3.0)
Eosinophils Absolute: 0.1 10*3/uL (ref 0.0–0.7)
Eosinophils Relative: 1.2 % (ref 0.0–5.0)
HCT: 38.4 % (ref 36.0–46.0)
Hemoglobin: 12.6 g/dL (ref 12.0–15.0)
Lymphocytes Relative: 36.1 % (ref 12.0–46.0)
Lymphs Abs: 1.6 10*3/uL (ref 0.7–4.0)
MCHC: 32.8 g/dL (ref 30.0–36.0)
MCV: 94.9 fl (ref 78.0–100.0)
Monocytes Absolute: 0.5 10*3/uL (ref 0.1–1.0)
Monocytes Relative: 10.4 % (ref 3.0–12.0)
Neutro Abs: 2.3 10*3/uL (ref 1.4–7.7)
Neutrophils Relative %: 51.6 % (ref 43.0–77.0)
Platelets: 257 10*3/uL (ref 150.0–400.0)
RBC: 4.04 Mil/uL (ref 3.87–5.11)
RDW: 13.6 % (ref 11.5–15.5)
WBC: 4.5 10*3/uL (ref 4.0–10.5)

## 2019-05-06 LAB — COMPREHENSIVE METABOLIC PANEL
ALT: 11 U/L (ref 0–35)
AST: 17 U/L (ref 0–37)
Albumin: 4.1 g/dL (ref 3.5–5.2)
Alkaline Phosphatase: 94 U/L (ref 39–117)
BUN: 24 mg/dL — ABNORMAL HIGH (ref 6–23)
CO2: 29 mEq/L (ref 19–32)
Calcium: 9.4 mg/dL (ref 8.4–10.5)
Chloride: 104 mEq/L (ref 96–112)
Creatinine, Ser: 0.65 mg/dL (ref 0.40–1.20)
GFR: 88.53 mL/min (ref >60.00)
Glucose, Bld: 97 mg/dL (ref 70–99)
Potassium: 4 mEq/L (ref 3.5–5.1)
Sodium: 138 mEq/L (ref 135–145)
Total Bilirubin: 0.5 mg/dL (ref 0.2–1.2)
Total Protein: 7 g/dL (ref 6.0–8.3)

## 2019-05-06 LAB — TSH: TSH: 1.88 u[IU]/mL (ref 0.35–4.50)

## 2019-05-06 LAB — LIPID PANEL
Cholesterol: 222 mg/dL — ABNORMAL HIGH (ref 0–200)
HDL: 52 mg/dL (ref >39.00)
LDL Cholesterol: 147 mg/dL — ABNORMAL HIGH (ref 0–99)
NonHDL: 169.9
Total CHOL/HDL Ratio: 4
Triglycerides: 117 mg/dL (ref 0.0–149.0)
VLDL: 23.4 mg/dL (ref 0.0–40.0)

## 2019-05-13 ENCOUNTER — Other Ambulatory Visit: Payer: Self-pay

## 2019-05-13 ENCOUNTER — Ambulatory Visit (INDEPENDENT_AMBULATORY_CARE_PROVIDER_SITE_OTHER): Payer: Medicare PPO | Admitting: Family Medicine

## 2019-05-13 ENCOUNTER — Encounter: Payer: Self-pay | Admitting: Family Medicine

## 2019-05-13 VITALS — BP 128/72 | HR 69 | Temp 97.5°F | Ht 61.25 in | Wt 155.5 lb

## 2019-05-13 DIAGNOSIS — I10 Essential (primary) hypertension: Secondary | ICD-10-CM

## 2019-05-13 DIAGNOSIS — F418 Other specified anxiety disorders: Secondary | ICD-10-CM

## 2019-05-13 DIAGNOSIS — M8589 Other specified disorders of bone density and structure, multiple sites: Secondary | ICD-10-CM | POA: Diagnosis not present

## 2019-05-13 DIAGNOSIS — Z0000 Encounter for general adult medical examination without abnormal findings: Secondary | ICD-10-CM

## 2019-05-13 DIAGNOSIS — E7800 Pure hypercholesterolemia, unspecified: Secondary | ICD-10-CM | POA: Diagnosis not present

## 2019-05-13 MED ORDER — AMLODIPINE BESYLATE 5 MG PO TABS: 5.0000 mg | ORAL_TABLET | Freq: Every day | ORAL | 3 refills | Status: DC

## 2019-05-13 MED ORDER — FLUOXETINE HCL 20 MG PO CAPS: 20.0000 mg | ORAL_CAPSULE | Freq: Every day | ORAL | 3 refills | Status: DC

## 2019-05-13 MED ORDER — ROSUVASTATIN CALCIUM 10 MG PO TABS: 10.0000 mg | ORAL_TABLET | Freq: Every day | ORAL | 3 refills | Status: DC

## 2019-05-13 NOTE — Assessment & Plan Note (Signed)
Disc goals for lipids and reasons to control them Rev last labs with pt Rev low sat fat diet in detail LDL up to 147 despite good diet  Will try crestor 10 mg daily (disc possible side eff)  Re check lab in 6 wk

## 2019-05-13 NOTE — Assessment & Plan Note (Signed)
Stable dexa 3/20  Taking ca and d and exercising  No falls or fractures

## 2019-05-13 NOTE — Progress Notes (Signed)
Subjective:    Patient ID: Sierra Sparks, female    DOB: Jan 28, 1941, 79 y.o.   MRN: [id number]  This visit occurred during the SARS-CoV-2 public health emergency.  Safety protocols were in place, including screening questions prior to the visit, additional usage of staff PPE, and extensive cleaning of exam room while observing appropriate contact time as indicated for disinfecting solutions.    HPI Pt presents for amw and health mt exam with review of chronic medical problems   I have personally reviewed the Medicare Annual Wellness questionnaire and have noted 1. The patient's medical and social history 2. Their use of alcohol, tobacco or illicit drugs 3. Their current medications and supplements 4. The patient's functional ability including ADL's, fall risks, home safety risks and hearing or visual             impairment. 5. Diet and physical activities 6. Evidence for depression or mood disorders  The patients weight, height, BMI have been recorded in the chart and visual acuity is per eye clinic.  I have made referrals, counseling and provided education to the patient based review of the above and I have provided the pt with a written personalized care plan for preventive services. Reviewed and updated provider list, see scanned forms.  See scanned forms.  Routine anticipatory guidance given to patient.  See health maintenance. Colon cancer screening  1/19 colonoscopy with 5 y recall  Breast cancer screening  Mammogram 3/20 Self breast exam-no lumps  Flu vaccine-had in the fall  Tetanus vaccine 11/17 Td Pneumovax completed Zoster vaccine   zostravax 2/12 Interested in the shingrix vaccine   Plans to get the covid vaccine   Dexa  3/20- stable osteopenia  Falls- she tripped twice this year (does not pick feet up as well as she used to) but did not fall Fractures- none  Supplements- D and calcium  Exercise -walks at the walking track 3 days per week    Advance  directive- is up to date  Cognitive function addressed- see scanned forms- and if abnormal then additional documentation follows.  Memory is about the same - stable  No confusion and not getting lost    PMH and SH reviewed  Meds, vitals, and allergies reviewed.   ROS: See HPI.  Otherwise negative.    Weight : Wt Readings from Last 3 Encounters:  05/13/19 155 lb 8 oz (70.5 kg)  07/23/18 142 lb 1 oz (64.4 kg)  05/11/18 144 lb 8 oz (65.5 kg)   29.14 kg/m Went off course during the holidays  Then went back on weight watchers and feels better    Hearing/vision:  Hearing Screening   125Hz  250Hz  500Hz  1000Hz  2000Hz  3000Hz  4000Hz  6000Hz  8000Hz   Right ear:           Left ear:           Comments: Pt wears hearing aids  Vision Screening Comments: Pt has yearly eye exam with Dr. Jomarie Longs, next exam is scheduled for 02/21   Hypertension  bp is stable today  No cp or palpitations or headaches or edema  No side effects to medicines  BP Readings from Last 3 Encounters:  05/13/19 128/72  07/23/18 130/80  05/11/18 (!) 158/88     Pulse Readings from Last 3 Encounters:  05/13/19 69  07/23/18 (!) 58  05/11/18 62      Hyperlipidemia Lab Results  Component Value Date   CHOL 222 (H) 05/06/2019   CHOL 207 (H)  05/11/2018   CHOL 224 (H) 03/31/2017   Lab Results  Component Value Date   HDL 52.00 05/06/2019   HDL 51.30 05/11/2018   HDL 49.90 03/31/2017   Lab Results  Component Value Date   LDLCALC 147 (H) 05/06/2019   LDLCALC 132 (H) 05/11/2018   LDLCALC 138 (H) 03/31/2017   Lab Results  Component Value Date   TRIG 117.0 05/06/2019   TRIG 115.0 05/11/2018   TRIG 182.0 (H) 03/31/2017   Lab Results  Component Value Date   CHOLHDL 4 05/06/2019   CHOLHDL 4 05/11/2018   CHOLHDL 4 03/31/2017   Lab Results  Component Value Date   LDLDIRECT 143.2 08/05/2012   mother had CAD and CVA  Does not eat beef  No fatty pork  Eats french fries every once in a while  No  fatty dairy  Does eat shellfish   No problems with a statin in the past  Wants to try again   Other labs Results for orders placed or performed in visit on 05/06/19  TSH  Result Value Ref Range   TSH 1.88 0.35 - 4.50 uIU/mL  Lipid panel  Result Value Ref Range   Cholesterol 222 (H) 0 - 200 mg/dL   Triglycerides 117.0 0.0 - 149.0 mg/dL   HDL 52.00 >39.00 mg/dL   VLDL 23.4 0.0 - 40.0 mg/dL   LDL Cholesterol 147 (H) 0 - 99 mg/dL   Total CHOL/HDL Ratio 4    NonHDL 169.90   Comprehensive metabolic panel  Result Value Ref Range   Sodium 138 135 - 145 mEq/L   Potassium 4.0 3.5 - 5.1 mEq/L   Chloride 104 96 - 112 mEq/L   CO2 29 19 - 32 mEq/L   Glucose, Bld 97 70 - 99 mg/dL   BUN 24 (H) 6 - 23 mg/dL   Creatinine, Ser 0.65 0.40 - 1.20 mg/dL   Total Bilirubin 0.5 0.2 - 1.2 mg/dL   Alkaline Phosphatase 94 39 - 117 U/L   AST 17 0 - 37 U/L   ALT 11 0 - 35 U/L   Total Protein 7.0 6.0 - 8.3 g/dL   Albumin 4.1 3.5 - 5.2 g/dL   GFR 88.53 >60.00 mL/min   Calcium 9.4 8.4 - 10.5 mg/dL  CBC with Differential  Result Value Ref Range   WBC 4.5 4.0 - 10.5 K/uL   RBC 4.04 3.87 - 5.11 Mil/uL   Hemoglobin 12.6 12.0 - 15.0 g/dL   HCT 38.4 36.0 - 46.0 %   MCV 94.9 78.0 - 100.0 fl   MCHC 32.8 30.0 - 36.0 g/dL   RDW 13.6 11.5 - 15.5 %   Platelets 257.0 150.0 - 400.0 K/uL   Neutrophils Relative % 51.6 43.0 - 77.0 %   Lymphocytes Relative 36.1 12.0 - 46.0 %   Monocytes Relative 10.4 3.0 - 12.0 %   Eosinophils Relative 1.2 0.0 - 5.0 %   Basophils Relative 0.7 0.0 - 3.0 %   Neutro Abs 2.3 1.4 - 7.7 K/uL   Lymphs Abs 1.6 0.7 - 4.0 K/uL   Monocytes Absolute 0.5 0.1 - 1.0 K/uL   Eosinophils Absolute 0.1 0.0 - 0.7 K/uL   Basophils Absolute 0.0 0.0 - 0.1 K/uL     Patient Active Problem List   Diagnosis Date Noted  . Vaginal lesion 07/23/2018  . Laceration of skin of right hand 04/25/2017  . Encounter for screening mammogram for breast cancer 03/31/2017  . Hearing loss in right ear 08/30/2016    .  Anal fissure 03/06/2016  . Screening mammogram, encounter for 02/26/2016  . Internal hemorrhoids 02/26/2016  . Routine general medical examination at a health care facility 02/03/2015  . Estrogen deficiency 02/03/2015  . Encounter for Medicare annual wellness exam 08/12/2012  . Glaucoma 08/12/2012  . Other screening mammogram 05/15/2011  . History of colon polyps 05/15/2011  . ALLERGIC RHINITIS 03/02/2008  . Osteopenia 01/05/2007  . HYPERCHOLESTEROLEMIA 10/24/2006  . ANXIETY 10/24/2006  . Depression with anxiety 10/24/2006  . Essential hypertension 10/24/2006  . HEMORRHOIDS 10/24/2006  . FIBROCYSTIC BREAST DISEASE 10/24/2006  . OSTEOARTHRITIS 10/24/2006   Past Medical History:  Diagnosis Date  . Allergic rhinitis, cause unspecified   . Allergy   . Anemia   . Anxiety state, unspecified   . Cataract    forming   . Colon polyps   . Depressive disorder, not elsewhere classified   . Diffuse cystic mastopathy   . Disorder of bone and cartilage, unspecified   . Diverticulosis   . External hemorrhoids   . GERD (gastroesophageal reflux disease)   . Glaucoma    narrow angle thatwas corrected with laser surgery   . Osteoarthrosis, unspecified whether generalized or localized, unspecified site    hands  . Osteoporosis   . Pure hypercholesterolemia   . Thyroid disease    years past- no meds currently   . Unspecified essential hypertension   . Unspecified hemorrhoids without mention of complication    Past Surgical History:  Procedure Laterality Date  . BUNIONECTOMY  7/01   left   . CESAREAN SECTION  1981  . CHOLECYSTECTOMY    . COLONOSCOPY  10/03   polyp; hemorrhoids;diverticulosis  . COLONOSCOPY  2013   diverticulosis  . DEXA  3/02   osteopenia  . DEXA  1/05   Stable  . HAND SURGERY     DIP fusion-left  . HEMORRHOID SURGERY    . POLYPECTOMY    . TONSILLECTOMY     childhood   Social History   Tobacco Use  . Smoking status: Never Smoker  . Smokeless  tobacco: Never Used  Substance Use Topics  . Alcohol use: Yes    Alcohol/week: 0.0 standard drinks    Comment: Occasional-wine  . Drug use: No   Family History  Problem Relation Age of Onset  . Colon cancer Brother        ? primary colon  . Arthritis Father   . Lung cancer Father        + smoker  . Stroke Mother   . Coronary artery disease Mother 59  . Stroke Maternal Grandmother        hemorrhagic  . Uterine cancer Maternal Grandmother   . Bone cancer Paternal Uncle   . Liver cancer Brother   . Esophageal cancer Neg Hx   . Rectal cancer Neg Hx   . Stomach cancer Neg Hx   . Breast cancer Neg Hx   . Colon polyps Neg Hx    No Known Allergies Current Outpatient Medications on File Prior to Visit  Medication Sig Dispense Refill  . Ascorbic Acid (VITAMIN C) 500 MG CAPS Take 1 tablet by mouth daily.    . Calcium Carbonate-Vit D-Min (CALCIUM 1200) 1200-1000 MG-UNIT CHEW Chew 1 tablet by mouth 2 (two) times daily.     Marland Kitchen docusate sodium (COLACE) 50 MG capsule Take 50 mg by mouth daily as needed for mild constipation.    Marland Kitchen loratadine (CLARITIN) 10 MG tablet Take 10 mg by mouth daily.    Marland Kitchen  Multiple Vitamin (MULTIVITAMIN) tablet Take 1 tablet by mouth daily.    . naproxen sodium (ALEVE) 220 MG tablet Take 220 mg by mouth daily.    Marland Kitchen senna (SENOKOT) 8.6 MG tablet Take 1 tablet by mouth daily as needed for constipation.     No current facility-administered medications on file prior to visit.     Review of Systems  Constitutional: Negative for activity change, appetite change, fatigue, fever and unexpected weight change.  HENT: Negative for congestion, ear pain, rhinorrhea, sinus pressure and sore throat.   Eyes: Negative for pain, redness and visual disturbance.  Respiratory: Negative for cough, shortness of breath and wheezing.   Cardiovascular: Negative for chest pain and palpitations.  Gastrointestinal: Negative for abdominal pain, blood in stool, constipation and diarrhea.   Endocrine: Negative for polydipsia and polyuria.  Genitourinary: Negative for dysuria, frequency and urgency.  Musculoskeletal: Negative for arthralgias, back pain and myalgias.  Skin: Negative for pallor and rash.  Allergic/Immunologic: Negative for environmental allergies.  Neurological: Negative for dizziness, syncope and headaches.       Does not pick up feet as well with walking and balance is worse with age  Hematological: Negative for adenopathy. Does not bruise/bleed easily.  Psychiatric/Behavioral: Negative for decreased concentration and dysphoric mood. The patient is not nervous/anxious.        Objective:   Physical Exam Constitutional:      General: She is not in acute distress.    Appearance: Normal appearance. She is well-developed. She is not ill-appearing or diaphoretic.     Comments: Overweight and well appearing   HENT:     Head: Normocephalic and atraumatic.     Right Ear: Tympanic membrane, ear canal and external ear normal.     Left Ear: Tympanic membrane, ear canal and external ear normal.     Nose: Nose normal. No congestion.     Mouth/Throat:     Mouth: Mucous membranes are moist.     Pharynx: Oropharynx is clear. No posterior oropharyngeal erythema.  Eyes:     General: No scleral icterus.    Extraocular Movements: Extraocular movements intact.     Conjunctiva/sclera: Conjunctivae normal.     Pupils: Pupils are equal, round, and reactive to light.  Neck:     Thyroid: No thyromegaly.     Vascular: No carotid bruit or JVD.  Cardiovascular:     Rate and Rhythm: Normal rate and regular rhythm.     Pulses: Normal pulses.     Heart sounds: Normal heart sounds. No gallop.   Pulmonary:     Effort: Pulmonary effort is normal. No respiratory distress.     Breath sounds: Normal breath sounds. No wheezing.     Comments: Good air exch Chest:     Chest wall: No tenderness.  Abdominal:     General: Bowel sounds are normal. There is no distension or abdominal  bruit.     Palpations: Abdomen is soft. There is no mass.     Tenderness: There is no abdominal tenderness.     Hernia: No hernia is present.  Genitourinary:    Comments: Breast exam: No mass, nodules, thickening, tenderness, bulging, retraction, inflamation, nipple discharge or skin changes noted.  No axillary or clavicular LA.     Musculoskeletal:        General: No tenderness. Normal range of motion.     Cervical back: Normal range of motion and neck supple. No rigidity. No muscular tenderness.     Right lower leg:  No edema.     Left lower leg: No edema.  Lymphadenopathy:     Cervical: No cervical adenopathy.  Skin:    General: Skin is warm and dry.     Coloration: Skin is not pale.     Findings: No erythema or rash.     Comments: Solar lentigines diffusely   Neurological:     Mental Status: She is alert. Mental status is at baseline.     Cranial Nerves: No cranial nerve deficit.     Motor: No abnormal muscle tone.     Coordination: Coordination normal.     Gait: Gait normal.     Deep Tendon Reflexes: Reflexes are normal and symmetric. Reflexes normal.  Psychiatric:        Mood and Affect: Mood normal.        Cognition and Memory: Cognition and memory normal.           Assessment & Plan:   Problem List Items Addressed This Visit      Cardiovascular and Mediastinum   Essential hypertension    bp in fair control at this time  BP Readings from Last 1 Encounters:  05/13/19 128/72   No changes needed Most recent labs reviewed  Disc lifstyle change with low sodium diet and exercise        Relevant Medications   rosuvastatin (CRESTOR) 10 MG tablet   amLODipine (NORVASC) 5 MG tablet     Musculoskeletal and Integument   Osteopenia    Stable dexa 3/20  Taking ca and d and exercising  No falls or fractures          Other   HYPERCHOLESTEROLEMIA    Disc goals for lipids and reasons to control them Rev last labs with pt Rev low sat fat diet in detail LDL up  to 147 despite good diet  Will try crestor 10 mg daily (disc possible side eff)  Re check lab in 6 wk      Relevant Medications   rosuvastatin (CRESTOR) 10 MG tablet   amLODipine (NORVASC) 5 MG tablet   Depression with anxiety    Continues to do well with fluoxetine Reviewed stressors/ coping techniques/symptoms/ support sources/ tx options and side effects in detail today Enc good self care and continued exercise       Relevant Medications   FLUoxetine (PROZAC) 20 MG capsule   Encounter for Medicare annual wellness exam - Primary      Reviewed health habits including diet and exercise and skin cancer prevention Reviewed appropriate screening tests for age  Also reviewed health mt list, fam hx and immunization status , as well as social and family history   See HPI Labs reviewed  Pt plans to get covid vaccine when it is available  Also interested in shingrix if affordable  Eating better- on wt watchers Hearing aides work well for her  Vision exam utd Advance directive is up to date  No cognitive concerns      Routine general medical examination at a health care facility    Reviewed health habits including diet and exercise and skin cancer prevention Reviewed appropriate screening tests for age  Also reviewed health mt list, fam hx and immunization status , as well as social and family history   See HPI Labs reviewed  Pt plans to get covid vaccine when it is available  Also interested in shingrix if affordable  Eating better- on wt watchers Hearing aides work well for her  Vision exam  utd Advance directive is up to date  No cognitive concerns

## 2019-05-13 NOTE — Assessment & Plan Note (Addendum)
Reviewed health habits including diet and exercise and skin cancer prevention Reviewed appropriate screening tests for age  Also reviewed health mt list, fam hx and immunization status , as well as social and family history   See HPI Labs reviewed  Pt plans to get covid vaccine when it is available  Also interested in shingrix if affordable  Eating better- on wt watchers Hearing aides work well for her  Vision exam utd Advance directive is up to date  No cognitive concerns

## 2019-05-13 NOTE — Assessment & Plan Note (Signed)
Continues to do well with fluoxetine Reviewed stressors/ coping techniques/symptoms/ support sources/ tx options and side effects in detail today Enc good self care and continued exercise

## 2019-05-13 NOTE — Assessment & Plan Note (Addendum)
   Reviewed health habits including diet and exercise and skin cancer prevention Reviewed appropriate screening tests for age  Also reviewed health mt list, fam hx and immunization status , as well as social and family history   See HPI Labs reviewed  Pt plans to get covid vaccine when it is available  Also interested in shingrix if affordable  Eating better- on wt watchers Hearing aides work well for her  Vision exam utd Advance directive is up to date  No cognitive concerns

## 2019-05-13 NOTE — Assessment & Plan Note (Signed)
bp in fair control at this time  BP Readings from Last 1 Encounters:  05/13/19 128/72   No changes needed Most recent labs reviewed  Disc lifstyle change with low sodium diet and exercise

## 2019-05-13 NOTE — Patient Instructions (Addendum)
If you are interested in the new shingles vaccine (Shingrix) - call your local pharmacy to check on coverage and availability  If affordable, get on a wait list at your pharmacy to get the vaccine.  Get your mammogram in march when it is due   For cholesterol Avoid red meat/ fried foods/ egg yolks/ fatty breakfast meats/ butter, cheese and high fat dairy/ and shellfish    Start crestor 10 mg daily for cholesterol  If side effects -stop it and let us know   Take care of yourself  Keep exercising

## 2019-05-25 DIAGNOSIS — H90A21 Sensorineural hearing loss, unilateral, right ear, with restricted hearing on the contralateral side: Secondary | ICD-10-CM | POA: Diagnosis not present

## 2019-05-28 ENCOUNTER — Other Ambulatory Visit: Payer: Self-pay | Admitting: Family Medicine

## 2019-05-28 DIAGNOSIS — Z1231 Encounter for screening mammogram for malignant neoplasm of breast: Secondary | ICD-10-CM

## 2019-05-31 DIAGNOSIS — L570 Actinic keratosis: Secondary | ICD-10-CM | POA: Diagnosis not present

## 2019-05-31 DIAGNOSIS — L820 Inflamed seborrheic keratosis: Secondary | ICD-10-CM | POA: Diagnosis not present

## 2019-05-31 DIAGNOSIS — L720 Epidermal cyst: Secondary | ICD-10-CM | POA: Diagnosis not present

## 2019-06-05 ENCOUNTER — Ambulatory Visit: Payer: Medicare PPO | Attending: Internal Medicine

## 2019-06-05 DIAGNOSIS — Z23 Encounter for immunization: Secondary | ICD-10-CM | POA: Insufficient documentation

## 2019-06-05 NOTE — Progress Notes (Signed)
   Covid-19 Vaccination Clinic  Name:  Sierra Sparks    MRN: [id number] DOB: June 15, 1940  06/05/2019  Sierra Sparks was observed post Covid-19 immunization for 15 minutes without incidence. She was provided with Vaccine Information Sheet and instruction to access the V-Safe system.   Sierra Sparks was instructed to call 911 with any severe reactions post vaccine: Marland Kitchen Difficulty breathing  . Swelling of your face and throat  . A fast heartbeat  . A bad rash all over your body  . Dizziness and weakness    Immunizations Administered    Name Date Dose VIS Date Route   Pfizer COVID-19 Vaccine 06/05/2019 10:59 AM 0.3 mL 03/26/2019 Intramuscular   Manufacturer: [place]   Lot: [id number]   [place]: [id number]

## 2019-06-09 DIAGNOSIS — L723 Sebaceous cyst: Secondary | ICD-10-CM | POA: Diagnosis not present

## 2019-06-24 ENCOUNTER — Other Ambulatory Visit: Payer: Self-pay

## 2019-06-24 ENCOUNTER — Other Ambulatory Visit (INDEPENDENT_AMBULATORY_CARE_PROVIDER_SITE_OTHER): Payer: Medicare PPO

## 2019-06-24 DIAGNOSIS — E7800 Pure hypercholesterolemia, unspecified: Secondary | ICD-10-CM | POA: Diagnosis not present

## 2019-06-24 LAB — LIPID PANEL
Cholesterol: 132 mg/dL (ref 0–200)
HDL: 41.8 mg/dL (ref >39.00)
LDL Cholesterol: 64 mg/dL (ref 0–99)
NonHDL: 90.57
Total CHOL/HDL Ratio: 3
Triglycerides: 131 mg/dL (ref 0.0–149.0)
VLDL: 26.2 mg/dL (ref 0.0–40.0)

## 2019-06-24 LAB — AST: AST: 19 U/L (ref 0–37)

## 2019-06-24 LAB — ALT: ALT: 13 U/L (ref 0–35)

## 2019-06-25 ENCOUNTER — Encounter: Payer: Self-pay | Admitting: *Deleted

## 2019-06-29 ENCOUNTER — Ambulatory Visit: Payer: Medicare PPO | Attending: Internal Medicine

## 2019-06-29 DIAGNOSIS — Z23 Encounter for immunization: Secondary | ICD-10-CM

## 2019-06-29 NOTE — Progress Notes (Signed)
   Covid-19 Vaccination Clinic  Name:  Sierra Sparks    MRN: [id number] DOB: Jul 04, 1940  06/29/2019  [name] was observed post Covid-19 immunization for 15 minutes without incident. She was provided with Vaccine Information Sheet and instruction to access the V-Safe system.   [name] was instructed to call 911 with any severe reactions post vaccine: Marland Kitchen Difficulty breathing  . Swelling of face and throat  . A fast heartbeat  . A bad rash all over body  . Dizziness and weakness   Immunizations Administered    Name Date Dose VIS Date Route   Pfizer COVID-19 Vaccine 06/29/2019 10:40 AM 0.3 mL 03/26/2019 Intramuscular   Manufacturer: [place]   Lot: [id number]   [place]: [id number]

## 2019-07-06 ENCOUNTER — Ambulatory Visit
Admission: RE | Admit: 2019-07-06 | Discharge: 2019-07-06 | Disposition: A | Discharge status: Home / Self Care | Payer: Medicare PPO | Source: Ambulatory Visit | Attending: Family Medicine | Admitting: Family Medicine

## 2019-07-06 DIAGNOSIS — Z1231 Encounter for screening mammogram for malignant neoplasm of breast: Secondary | ICD-10-CM | POA: Insufficient documentation

## 2019-11-23 DIAGNOSIS — M76821 Posterior tibial tendinitis, right leg: Secondary | ICD-10-CM | POA: Diagnosis not present

## 2020-05-08 ENCOUNTER — Telehealth: Payer: Self-pay | Admitting: Family Medicine

## 2020-05-08 DIAGNOSIS — I10 Essential (primary) hypertension: Secondary | ICD-10-CM

## 2020-05-08 DIAGNOSIS — E7800 Pure hypercholesterolemia, unspecified: Secondary | ICD-10-CM

## 2020-05-08 NOTE — Telephone Encounter (Signed)
-----   Message from Cloyd Stagers, RT sent at 04/24/2020  3:13 PM EST ----- Regarding: Lab Orders for Tuesday 1.25.2022 Please place lab orders for Tuesday 1.25.2022, office visit for physical on Tuesday 2.1.2022 Thank you, Dyke Maes RT(R)

## 2020-05-09 ENCOUNTER — Other Ambulatory Visit: Payer: Medicare PPO

## 2020-05-10 ENCOUNTER — Other Ambulatory Visit: Payer: Medicare PPO

## 2020-05-11 ENCOUNTER — Other Ambulatory Visit: Payer: Self-pay

## 2020-05-11 ENCOUNTER — Other Ambulatory Visit (INDEPENDENT_AMBULATORY_CARE_PROVIDER_SITE_OTHER): Payer: Medicare PPO

## 2020-05-11 DIAGNOSIS — E7800 Pure hypercholesterolemia, unspecified: Secondary | ICD-10-CM

## 2020-05-11 DIAGNOSIS — I10 Essential (primary) hypertension: Secondary | ICD-10-CM | POA: Diagnosis not present

## 2020-05-11 LAB — CBC WITH DIFFERENTIAL/PLATELET
Basophils Absolute: 0 10*3/uL (ref 0.0–0.1)
Basophils Relative: 0.5 % (ref 0.0–3.0)
Eosinophils Absolute: 0 10*3/uL (ref 0.0–0.7)
Eosinophils Relative: 1 % (ref 0.0–5.0)
HCT: 38 % (ref 36.0–46.0)
Hemoglobin: 12.5 g/dL (ref 12.0–15.0)
Lymphocytes Relative: 32.2 % (ref 12.0–46.0)
Lymphs Abs: 1.5 10*3/uL (ref 0.7–4.0)
MCHC: 32.9 g/dL (ref 30.0–36.0)
MCV: 92.7 fl (ref 78.0–100.0)
Monocytes Absolute: 0.5 10*3/uL (ref 0.1–1.0)
Monocytes Relative: 11.7 % (ref 3.0–12.0)
Neutro Abs: 2.6 10*3/uL (ref 1.4–7.7)
Neutrophils Relative %: 54.6 % (ref 43.0–77.0)
Platelets: 229 10*3/uL (ref 150.0–400.0)
RBC: 4.1 Mil/uL (ref 3.87–5.11)
RDW: 13.6 % (ref 11.5–15.5)
WBC: 4.7 10*3/uL (ref 4.0–10.5)

## 2020-05-11 LAB — COMPREHENSIVE METABOLIC PANEL
ALT: 14 U/L (ref 0–35)
AST: 20 U/L (ref 0–37)
Albumin: 4.2 g/dL (ref 3.5–5.2)
Alkaline Phosphatase: 89 U/L (ref 39–117)
BUN: 22 mg/dL (ref 6–23)
CO2: 30 mEq/L (ref 19–32)
Calcium: 9.8 mg/dL (ref 8.4–10.5)
Chloride: 105 mEq/L (ref 96–112)
Creatinine, Ser: 0.75 mg/dL (ref 0.40–1.20)
GFR: 76.79 mL/min (ref >60.00)
Glucose, Bld: 98 mg/dL (ref 70–99)
Potassium: 4.5 mEq/L (ref 3.5–5.1)
Sodium: 140 mEq/L (ref 135–145)
Total Bilirubin: 0.5 mg/dL (ref 0.2–1.2)
Total Protein: 6.8 g/dL (ref 6.0–8.3)

## 2020-05-11 LAB — LIPID PANEL
Cholesterol: 129 mg/dL (ref 0–200)
HDL: 38.9 mg/dL — ABNORMAL LOW (ref >39.00)
LDL Cholesterol: 64 mg/dL (ref 0–99)
NonHDL: 90.29
Total CHOL/HDL Ratio: 3
Triglycerides: 129 mg/dL (ref 0.0–149.0)
VLDL: 25.8 mg/dL (ref 0.0–40.0)

## 2020-05-11 LAB — TSH: TSH: 2.54 u[IU]/mL (ref 0.35–4.50)

## 2020-05-16 ENCOUNTER — Ambulatory Visit (INDEPENDENT_AMBULATORY_CARE_PROVIDER_SITE_OTHER): Payer: Medicare PPO | Admitting: Family Medicine

## 2020-05-16 ENCOUNTER — Encounter: Payer: Self-pay | Admitting: Family Medicine

## 2020-05-16 ENCOUNTER — Other Ambulatory Visit: Payer: Self-pay

## 2020-05-16 VITALS — BP 126/78 | HR 76 | Temp 96.9°F | Ht 61.5 in | Wt 162.2 lb

## 2020-05-16 DIAGNOSIS — Z0000 Encounter for general adult medical examination without abnormal findings: Secondary | ICD-10-CM

## 2020-05-16 DIAGNOSIS — M8589 Other specified disorders of bone density and structure, multiple sites: Secondary | ICD-10-CM | POA: Diagnosis not present

## 2020-05-16 DIAGNOSIS — I10 Essential (primary) hypertension: Secondary | ICD-10-CM | POA: Diagnosis not present

## 2020-05-16 DIAGNOSIS — E7800 Pure hypercholesterolemia, unspecified: Secondary | ICD-10-CM

## 2020-05-16 DIAGNOSIS — F418 Other specified anxiety disorders: Secondary | ICD-10-CM | POA: Diagnosis not present

## 2020-05-16 MED ORDER — ROSUVASTATIN CALCIUM 10 MG PO TABS: 10.0000 mg | ORAL_TABLET | Freq: Every day | ORAL | 3 refills | Status: DC

## 2020-05-16 MED ORDER — AMLODIPINE BESYLATE 5 MG PO TABS: 5.0000 mg | ORAL_TABLET | Freq: Every day | ORAL | 3 refills | Status: DC

## 2020-05-16 MED ORDER — FLUOXETINE HCL 20 MG PO CAPS: 20.0000 mg | ORAL_CAPSULE | Freq: Every day | ORAL | 3 refills | Status: DC

## 2020-05-16 NOTE — Assessment & Plan Note (Signed)
Does well with fluoxetine  Reviewed stressors/ coping techniques/symptoms/ support sources/ tx options and side effects in detail today

## 2020-05-16 NOTE — Assessment & Plan Note (Signed)
Reviewed health habits including diet and exercise and skin cancer prevention Reviewed appropriate screening tests for age  Also reviewed health mt list, fam hx and immunization status , as well as social and family history   See HPI Labs reviewed  utd colon and breast cancer screening  Discussed shingrix vaccine-interested if covered dexa due after march  Advance directive is up to date  No cognitive concerns Abn vision screen/ R eye -suspect worsened cataract and pt plans to f/u with ophty Commended on good health habits  

## 2020-05-16 NOTE — Assessment & Plan Note (Signed)
bp in fair control at this time  BP Readings from Last 1 Encounters:  05/16/20 126/78   No changes needed Most recent labs reviewed  Disc lifstyle change with low sodium diet and exercise  Plan to continue amlodipine 5 mg daily

## 2020-05-16 NOTE — Assessment & Plan Note (Signed)
Reviewed health habits including diet and exercise and skin cancer prevention Reviewed appropriate screening tests for age  Also reviewed health mt list, fam hx and immunization status , as well as social and family history   See HPI Labs reviewed  utd colon and breast cancer screening  Discussed shingrix vaccine-interested if covered dexa due after march  Advance directive is up to date  No cognitive concerns Abn vision screen/ R eye -suspect worsened cataract and pt plans to f/u with ophty Commended on good health habits

## 2020-05-16 NOTE — Patient Instructions (Addendum)
If you are interested in the new shingles vaccine (Shingrix) - call your local pharmacy to check on coverage and availability  If affordable, get on a wait list at your pharmacy to get the vaccine.  Do see your eye doctor --right eye vision is not as good   Keep up the good work with diet and exercise   If you want a bone density after march let us know

## 2020-05-16 NOTE — Assessment & Plan Note (Signed)
Disc goals for lipids and reasons to control them Rev last labs with pt Rev low sat fat diet in detail Good control with rosuvastatin 10 mg daily

## 2020-05-16 NOTE — Assessment & Plan Note (Signed)
Due for dexa in march  No fractures (one fall with injury but no def fracture) Taking ca and D Good exercise

## 2020-05-16 NOTE — Progress Notes (Signed)
Subjective:    Patient ID: Sierra Sparks, female    DOB: 1941-04-09, 80 y.o.   MRN: [id number]  This visit occurred during the SARS-CoV-2 public health emergency.  Safety protocols were in place, including screening questions prior to the visit, additional usage of staff PPE, and extensive cleaning of exam room while observing appropriate contact time as indicated for disinfecting solutions.    HPI Pt presents for amw and health mt exam   I have personally reviewed the Medicare Annual Wellness questionnaire and have noted 1. The patient's medical and social history 2. Their use of alcohol, tobacco or illicit drugs 3. Their current medications and supplements 4. The patient's functional ability including ADL's, fall risks, home safety risks and hearing or visual             impairment. 5. Diet and physical activities 6. Evidence for depression or mood disorders  The patients weight, height, BMI have been recorded in the chart and visual acuity is per eye clinic.  I have made referrals, counseling and provided education to the patient based review of the above and I have provided the pt with a written personalized care plan for preventive services. Reviewed and updated provider list, see scanned forms.  See scanned forms.  Routine anticipatory guidance given to patient.  See health maintenance. Colon cancer screening colonoscopy 1/19 with 5 y recall Brother had colon cancer  Breast cancer screening 3/21 mammogram Self breast exam=no lumps  Flu vaccine 9/21 Tetanus vaccine Td 11/17 Pneumovax completed covid vaccinated - with booster  Zoster vaccine  zostavax 2012 Dexa 3/20   Stable osteopenia  Falls- tripped on a curb- had rib pain / better now  [employer] ca and D  Exercise - goes to the gym/ machines   Advance directive- utd  Cognitive function addressed- see scanned forms- and if abnormal then additional documentation follows.   No problems other than  forgetting occ  No confusion and not getting lost   PMH and SH reviewed  Meds, vitals, and allergies reviewed.   ROS: See HPI.  Otherwise negative.    Weight : Wt Readings from Last 3 Encounters:  05/16/20 162 lb 3 oz (73.6 kg)  05/13/19 155 lb 8 oz (70.5 kg)  07/23/18 142 lb 1 oz (64.4 kg)  wt was up more- then lost 10 lb since jan first  Exercising regularly  30.15 kg/m   Hearing/vision:  Hearing Screening   125Hz  250Hz  500Hz  1000Hz  2000Hz  3000Hz  4000Hz  6000Hz  8000Hz   Right ear:           Left ear:           Comments: Pt wears hearing aids   Visual Acuity Screening   Right eye Left eye Both eyes  Without correction:     With correction: 20/40 20/25 20/25   she has noticed this  Thinks she has a cataract -is due to see the eye doctor    Care team Rosalva Neary- pcp [facility]- [facility]  HTN bp is stable today  No cp or palpitations or headaches or edema  No side effects to medicines  BP Readings from Last 3 Encounters:  05/16/20 126/78  05/13/19 128/72  07/23/18 130/80    Takes amlodipine 5 mg daily  Pulse Readings from Last 3 Encounters:  05/16/20 76  05/13/19 69  07/23/18 (!) 58     Hyperlipidemia Lab Results  Component Value Date   CHOL 129 05/11/2020   CHOL 132 06/24/2019   CHOL 222 (  H) 05/06/2019   Lab Results  Component Value Date   HDL 38.90 (L) 05/11/2020   HDL 41.80 06/24/2019   HDL 52.00 05/06/2019   Lab Results  Component Value Date   LDLCALC 64 05/11/2020   LDLCALC 64 06/24/2019   LDLCALC 147 (H) 05/06/2019   Lab Results  Component Value Date   TRIG 129.0 05/11/2020   TRIG 131.0 06/24/2019   TRIG 117.0 05/06/2019   Lab Results  Component Value Date   CHOLHDL 3 05/11/2020   CHOLHDL 3 06/24/2019   CHOLHDL 4 05/06/2019   Lab Results  Component Value Date   LDLDIRECT 143.2 08/05/2012   Rosuvastatin 10 mg daily  Improved diet since jan 1   H/o depression with anxiety  Takes fluoxetine 20 mg qd   Mood is good    Other  labs Results for orders placed or performed in visit on 05/11/20  TSH  Result Value Ref Range   TSH 2.54 0.35 - 4.50 uIU/mL  CBC with Differential/Platelet  Result Value Ref Range   WBC 4.7 4.0 - 10.5 K/uL   RBC 4.10 3.87 - 5.11 Mil/uL   Hemoglobin 12.5 12.0 - 15.0 g/dL   HCT 38.0 36.0 - 46.0 %   MCV 92.7 78.0 - 100.0 fl   MCHC 32.9 30.0 - 36.0 g/dL   RDW 13.6 11.5 - 15.5 %   Platelets 229.0 150.0 - 400.0 K/uL   Neutrophils Relative % 54.6 43.0 - 77.0 %   Lymphocytes Relative 32.2 12.0 - 46.0 %   Monocytes Relative 11.7 3.0 - 12.0 %   Eosinophils Relative 1.0 0.0 - 5.0 %   Basophils Relative 0.5 0.0 - 3.0 %   Neutro Abs 2.6 1.4 - 7.7 K/uL   Lymphs Abs 1.5 0.7 - 4.0 K/uL   Monocytes Absolute 0.5 0.1 - 1.0 K/uL   Eosinophils Absolute 0.0 0.0 - 0.7 K/uL   Basophils Absolute 0.0 0.0 - 0.1 K/uL  Lipid panel  Result Value Ref Range   Cholesterol 129 0 - 200 mg/dL   Triglycerides 129.0 0.0 - 149.0 mg/dL   HDL 38.90 (L) >39.00 mg/dL   VLDL 25.8 0.0 - 40.0 mg/dL   LDL Cholesterol 64 0 - 99 mg/dL   Total CHOL/HDL Ratio 3    NonHDL 90.29   Comprehensive metabolic panel  Result Value Ref Range   Sodium 140 135 - 145 mEq/L   Potassium 4.5 3.5 - 5.1 mEq/L   Chloride 105 96 - 112 mEq/L   CO2 30 19 - 32 mEq/L   Glucose, Bld 98 70 - 99 mg/dL   BUN 22 6 - 23 mg/dL   Creatinine, Ser 0.75 0.40 - 1.20 mg/dL   Total Bilirubin 0.5 0.2 - 1.2 mg/dL   Alkaline Phosphatase 89 39 - 117 U/L   AST 20 0 - 37 U/L   ALT 14 0 - 35 U/L   Total Protein 6.8 6.0 - 8.3 g/dL   Albumin 4.2 3.5 - 5.2 g/dL   GFR 76.79 >60.00 mL/min   Calcium 9.8 8.4 - 10.5 mg/dL    Patient Active Problem List   Diagnosis Date Noted  . Encounter for screening mammogram for breast cancer 03/31/2017  . Hearing loss in right ear 08/30/2016  . Anal fissure 03/06/2016  . Internal hemorrhoids 02/26/2016  . Routine general medical examination at a health care facility 02/03/2015  . Estrogen deficiency 02/03/2015  .  Encounter for Medicare annual wellness exam 08/12/2012  . Glaucoma 08/12/2012  . History of  colon polyps 05/15/2011  . ALLERGIC RHINITIS 03/02/2008  . Osteopenia 01/05/2007  . HYPERCHOLESTEROLEMIA 10/24/2006  . Depression with anxiety 10/24/2006  . Essential hypertension 10/24/2006  . HEMORRHOIDS 10/24/2006  . FIBROCYSTIC BREAST DISEASE 10/24/2006  . OSTEOARTHRITIS 10/24/2006   Past Medical History:  Diagnosis Date  . Allergic rhinitis, cause unspecified   . Allergy   . Anemia   . Anxiety state, unspecified   . Cataract    forming   . Colon polyps   . Depressive disorder, not elsewhere classified   . Diffuse cystic mastopathy   . Disorder of bone and cartilage, unspecified   . Diverticulosis   . External hemorrhoids   . GERD (gastroesophageal reflux disease)   . Glaucoma    narrow angle thatwas corrected with laser surgery   . Osteoarthrosis, unspecified whether generalized or localized, unspecified site    hands  . Osteoporosis   . Pure hypercholesterolemia   . Thyroid disease    years past- no meds currently   . Unspecified essential hypertension   . Unspecified hemorrhoids without mention of complication    Past Surgical History:  Procedure Laterality Date  . BUNIONECTOMY  7/01   left   . CESAREAN SECTION  1981  . CHOLECYSTECTOMY    . COLONOSCOPY  10/03   polyp; hemorrhoids;diverticulosis  . COLONOSCOPY  2013   diverticulosis  . DEXA  3/02   osteopenia  . DEXA  1/05   Stable  . HAND SURGERY     DIP fusion-left  . HEMORRHOID SURGERY    . POLYPECTOMY    . TONSILLECTOMY     childhood   Social History   Tobacco Use  . Smoking status: Never Smoker  . Smokeless tobacco: Never Used  Vaping Use  . Vaping Use: Never used  Substance Use Topics  . Alcohol use: Yes    Alcohol/week: 0.0 standard drinks    Comment: Occasional-wine  . Drug use: No   Family History  Problem Relation Age of Onset  . Colon cancer Brother        ? primary colon  . Arthritis  Father   . Lung cancer Father        + smoker  . Stroke Mother   . Coronary artery disease Mother 59  . Stroke Maternal Grandmother        hemorrhagic  . Uterine cancer Maternal Grandmother   . Bone cancer Paternal Uncle   . Liver cancer Brother   . Esophageal cancer Neg Hx   . Rectal cancer Neg Hx   . Stomach cancer Neg Hx   . Breast cancer Neg Hx   . Colon polyps Neg Hx    No Known Allergies Current Outpatient Medications on File Prior to Visit  Medication Sig Dispense Refill  . Ascorbic Acid (VITAMIN C) 500 MG CAPS Take 1 tablet by mouth daily.    . Calcium Carbonate-Vit D-Min (CALCIUM 1200) 1200-1000 MG-UNIT CHEW Chew 1 tablet by mouth 2 (two) times daily.     Marland Kitchen docusate sodium (COLACE) 50 MG capsule Take 50 mg by mouth daily as needed for mild constipation.    . Multiple Vitamin (MULTIVITAMIN) tablet Take 1 tablet by mouth daily.    . naproxen sodium (ALEVE) 220 MG tablet Take 220 mg by mouth daily.     No current facility-administered medications on file prior to visit.    Review of Systems  Constitutional: Negative for activity change, appetite change, fatigue, fever and unexpected weight change.  HENT: Negative  for congestion, ear pain, rhinorrhea, sinus pressure and sore throat.   Eyes: Negative for pain, redness and visual disturbance.  Respiratory: Negative for cough, shortness of breath and wheezing.   Cardiovascular: Negative for chest pain and palpitations.  Gastrointestinal: Negative for abdominal pain, blood in stool, constipation and diarrhea.  Endocrine: Negative for polydipsia and polyuria.  Genitourinary: Negative for dysuria, frequency and urgency.  Musculoskeletal: Negative for arthralgias, back pain and myalgias.  Skin: Negative for pallor and rash.  Allergic/Immunologic: Negative for environmental allergies.  Neurological: Negative for dizziness, syncope and headaches.  Hematological: Negative for adenopathy. Does not bruise/bleed easily.   Psychiatric/Behavioral: Negative for decreased concentration and dysphoric mood. The patient is not nervous/anxious.        Objective:   Physical Exam Constitutional:      General: She is not in acute distress.    Appearance: Normal appearance. She is well-developed. She is obese. She is not ill-appearing or diaphoretic.  HENT:     Head: Normocephalic and atraumatic.     Right Ear: Tympanic membrane, ear canal and external ear normal.     Left Ear: Tympanic membrane, ear canal and external ear normal.     Nose: Nose normal. No congestion.     Mouth/Throat:     Mouth: Mucous membranes are moist.     Pharynx: Oropharynx is clear. No posterior oropharyngeal erythema.  Eyes:     General: No scleral icterus.    Extraocular Movements: Extraocular movements intact.     Conjunctiva/sclera: Conjunctivae normal.     Pupils: Pupils are equal, round, and reactive to light.  Neck:     Thyroid: No thyromegaly.     Vascular: No carotid bruit or JVD.  Cardiovascular:     Rate and Rhythm: Normal rate and regular rhythm.     Pulses: Normal pulses.     Heart sounds: Normal heart sounds. No gallop.   Pulmonary:     Effort: Pulmonary effort is normal. No respiratory distress.     Breath sounds: Normal breath sounds. No wheezing.     Comments: Good air exch Chest:     Chest wall: No tenderness.  Abdominal:     General: Bowel sounds are normal. There is no distension or abdominal bruit.     Palpations: Abdomen is soft. There is no mass.     Tenderness: There is no abdominal tenderness.     Hernia: No hernia is present.  Genitourinary:    Comments: Breast exam: No mass, nodules, thickening, tenderness, bulging, retraction, inflamation, nipple discharge or skin changes noted.  No axillary or clavicular LA.     Musculoskeletal:        General: No tenderness. Normal range of motion.     Cervical back: Normal range of motion and neck supple. No rigidity. No muscular tenderness.     Right lower  leg: No edema.     Left lower leg: No edema.  Lymphadenopathy:     Cervical: No cervical adenopathy.  Skin:    General: Skin is warm and dry.     Coloration: Skin is not pale.     Findings: No erythema or rash.     Comments: Solar lentigines diffusely   Neurological:     Mental Status: She is alert. Mental status is at baseline.     Cranial Nerves: No cranial nerve deficit.     Motor: No abnormal muscle tone.     Coordination: Coordination normal.     Gait: Gait normal.  Deep Tendon Reflexes: Reflexes are normal and symmetric. Reflexes normal.  Psychiatric:        Mood and Affect: Mood normal.        Cognition and Memory: Cognition and memory normal.           Assessment & Plan:   Problem List Items Addressed This Visit      Cardiovascular and Mediastinum   Essential hypertension    bp in fair control at this time  BP Readings from Last 1 Encounters:  05/16/20 126/78   No changes needed Most recent labs reviewed  Disc lifstyle change with low sodium diet and exercise  Plan to continue amlodipine 5 mg daily        Relevant Medications   amLODipine (NORVASC) 5 MG tablet   rosuvastatin (CRESTOR) 10 MG tablet     Musculoskeletal and Integument   Osteopenia    Due for dexa in march  No fractures (one fall with injury but no def fracture) Taking ca and D Good exercise         Other   HYPERCHOLESTEROLEMIA    Disc goals for lipids and reasons to control them Rev last labs with pt Rev low sat fat diet in detail Good control with rosuvastatin 10 mg daily       Relevant Medications   amLODipine (NORVASC) 5 MG tablet   rosuvastatin (CRESTOR) 10 MG tablet   Depression with anxiety    Does well with fluoxetine  Reviewed stressors/ coping techniques/symptoms/ support sources/ tx options and side effects in detail today        Relevant Medications   FLUoxetine (PROZAC) 20 MG capsule   Encounter for Medicare annual wellness exam - Primary    Reviewed  health habits including diet and exercise and skin cancer prevention Reviewed appropriate screening tests for age  Also reviewed health mt list, fam hx and immunization status , as well as social and family history   See HPI Labs reviewed  utd colon and breast cancer screening  Discussed shingrix vaccine-interested if covered dexa due after march  Advance directive is up to date  No cognitive concerns Abn vision screen/ R eye -suspect worsened cataract and pt plans to f/u with ophty Commended on good health habits       Routine general medical examination at a health care facility    Reviewed health habits including diet and exercise and skin cancer prevention Reviewed appropriate screening tests for age  Also reviewed health mt list, fam hx and immunization status , as well as social and family history   See HPI Labs reviewed  utd colon and breast cancer screening  Discussed shingrix vaccine-interested if covered dexa due after march  Advance directive is up to date  No cognitive concerns Abn vision screen/ R eye -suspect worsened cataract and pt plans to f/u with ophty Commended on good health habits

## 2020-05-22 ENCOUNTER — Other Ambulatory Visit: Payer: Self-pay | Admitting: Family Medicine

## 2020-06-15 DIAGNOSIS — H35373 Puckering of macula, bilateral: Secondary | ICD-10-CM | POA: Diagnosis not present

## 2020-06-15 DIAGNOSIS — H2513 Age-related nuclear cataract, bilateral: Secondary | ICD-10-CM | POA: Diagnosis not present

## 2020-06-15 DIAGNOSIS — H40013 Open angle with borderline findings, low risk, bilateral: Secondary | ICD-10-CM | POA: Diagnosis not present

## 2020-06-15 DIAGNOSIS — H40033 Anatomical narrow angle, bilateral: Secondary | ICD-10-CM | POA: Diagnosis not present

## 2020-07-10 ENCOUNTER — Other Ambulatory Visit: Payer: Self-pay | Admitting: Family Medicine

## 2020-07-13 ENCOUNTER — Other Ambulatory Visit: Payer: Self-pay | Admitting: Family Medicine

## 2020-07-13 DIAGNOSIS — Z1231 Encounter for screening mammogram for malignant neoplasm of breast: Secondary | ICD-10-CM

## 2020-07-17 ENCOUNTER — Other Ambulatory Visit: Payer: Self-pay | Admitting: Family Medicine

## 2020-07-25 ENCOUNTER — Ambulatory Visit
Admission: RE | Admit: 2020-07-25 | Discharge: 2020-07-25 | Disposition: A | Discharge status: Home / Self Care | Payer: Medicare PPO | Source: Ambulatory Visit | Attending: Family Medicine | Admitting: Family Medicine

## 2020-07-25 ENCOUNTER — Other Ambulatory Visit: Payer: Self-pay

## 2020-07-25 DIAGNOSIS — Z1231 Encounter for screening mammogram for malignant neoplasm of breast: Secondary | ICD-10-CM | POA: Diagnosis not present

## 2020-07-26 ENCOUNTER — Other Ambulatory Visit: Payer: Self-pay | Admitting: Family Medicine

## 2020-07-26 DIAGNOSIS — R928 Other abnormal and inconclusive findings on diagnostic imaging of breast: Secondary | ICD-10-CM

## 2020-07-26 DIAGNOSIS — N6489 Other specified disorders of breast: Secondary | ICD-10-CM

## 2020-08-04 ENCOUNTER — Ambulatory Visit
Admission: RE | Admit: 2020-08-04 | Discharge: 2020-08-04 | Disposition: A | Discharge status: Home / Self Care | Payer: Medicare PPO | Source: Ambulatory Visit | Attending: Family Medicine | Admitting: Family Medicine

## 2020-08-04 ENCOUNTER — Other Ambulatory Visit: Payer: Self-pay

## 2020-08-04 DIAGNOSIS — R928 Other abnormal and inconclusive findings on diagnostic imaging of breast: Secondary | ICD-10-CM | POA: Insufficient documentation

## 2020-08-04 DIAGNOSIS — N6489 Other specified disorders of breast: Secondary | ICD-10-CM | POA: Diagnosis not present

## 2020-08-04 DIAGNOSIS — N6322 Unspecified lump in the left breast, upper inner quadrant: Secondary | ICD-10-CM | POA: Diagnosis not present

## 2020-08-07 ENCOUNTER — Other Ambulatory Visit: Payer: Self-pay | Admitting: Family Medicine

## 2020-08-07 DIAGNOSIS — R928 Other abnormal and inconclusive findings on diagnostic imaging of breast: Secondary | ICD-10-CM

## 2020-08-07 DIAGNOSIS — N6320 Unspecified lump in the left breast, unspecified quadrant: Secondary | ICD-10-CM

## 2020-08-11 ENCOUNTER — Other Ambulatory Visit: Payer: Self-pay

## 2020-08-11 ENCOUNTER — Ambulatory Visit
Admission: RE | Admit: 2020-08-11 | Discharge: 2020-08-11 | Disposition: A | Discharge status: Home / Self Care | Payer: Medicare PPO | Source: Ambulatory Visit | Attending: Family Medicine | Admitting: Family Medicine

## 2020-08-11 ENCOUNTER — Other Ambulatory Visit: Payer: Self-pay | Admitting: Family Medicine

## 2020-08-11 DIAGNOSIS — N6320 Unspecified lump in the left breast, unspecified quadrant: Secondary | ICD-10-CM | POA: Insufficient documentation

## 2020-08-11 DIAGNOSIS — N6322 Unspecified lump in the left breast, upper inner quadrant: Secondary | ICD-10-CM | POA: Diagnosis not present

## 2020-08-11 DIAGNOSIS — R928 Other abnormal and inconclusive findings on diagnostic imaging of breast: Secondary | ICD-10-CM

## 2020-08-11 DIAGNOSIS — D242 Benign neoplasm of left breast: Secondary | ICD-10-CM | POA: Diagnosis not present

## 2020-08-11 HISTORY — PX: BREAST BIOPSY: SHX20

## 2020-08-14 ENCOUNTER — Encounter: Payer: Self-pay | Admitting: *Deleted

## 2020-08-14 LAB — SURGICAL PATHOLOGY

## 2020-08-14 NOTE — Progress Notes (Signed)
Informed by Electa Sniff, RN from [facility] that patient needs surgical consult for a papilloma.  She would like at Psychologist, sport and exercise at [facility].  I have scheduled her to see Dr. Lysle Pearl at 2:00 tomorrow.

## 2020-08-21 ENCOUNTER — Ambulatory Visit: Payer: Self-pay | Admitting: Surgery

## 2020-08-21 DIAGNOSIS — D242 Benign neoplasm of left breast: Secondary | ICD-10-CM | POA: Diagnosis not present

## 2020-08-21 NOTE — H&P (View-Only) (Signed)
Subjective:   CC: Papilloma of left breast [D24.2] HPI:  Sierra Sparks is a 80 y.o. female who was referred by Allena Earing, MD for evaluation of above. Change was noted on last screening mammogram. Patient does routinely do self breast exams.  Patient has not noted a change on breast exam.  Patient denies nipple discharge. Patient denies previous breast biopsy. Patient denies a personal history of breast cancer.   Past Medical History: HTN  Past Surgical History: gallbladder, c-section, hand surgery  Family History: no reported breast CA in family  Social History:  reports that she has never smoked. She has never used smokeless tobacco. No history on file for alcohol use and drug use.  Current Medications: has a current medication list which includes the following prescription(s): amlodipine, ascorbic acid (vitamin c), calcium carbonate/vitamin d3, fluoxetine, multivitamin, naproxen sodium, and rosuvastatin.  Allergies:     Allergies as of 08/21/2020  . (Not on File)    ROS:  A 15 point review of systems was performed and was negative except as noted in HPI   Objective:   BP (!) 146/85   Pulse 69   Ht 156.2 cm (5' 1.5")   Wt 67.6 kg (149 lb)   BMI 27.70 kg/m   Constitutional :  alert, appears stated age, cooperative and no distress  Lymphatics/Throat:  no asymmetry, masses, or scars  Respiratory:  clear to auscultation bilaterally  Cardiovascular:  regular rate and rhythm  Gastrointestinal: soft, non-tender; bowel sounds normal; no masses,  no organomegaly.   Musculoskeletal: Steady gait and movement  Skin: Cool and moist  Psychiatric: Normal affect, non-agitated, not confused  Breast:  Chaperone present for exam.  breasts appear normal, no suspicious masses, no skin or nipple changes or axillary nodes.    LABS:    SURGICAL PATHOLOGY  SURGICAL PATHOLOGY  CASE: [id number]  PATIENT: Sierra Sparks  Surgical Pathology Report      Specimen  Submitted:  A. Breast, left, 11 o'clock 3 cm from nipple; biopsy   Clinical History: Left breast ultrasound shows intraductal mass versus  debris, no mammographic correlate.  Post biopsy mammograms show appropriate positioning of the Q shaped  biopsy marking clip at the  site of biopsy in the LEFT upper inner breast.      DIAGNOSIS:  A. BREAST, LEFT, 11 O'CLOCK 3 CM FROM NIPPLE; ULTRASOUND-GUIDED CORE  BIOPSY:  - INTRADUCTAL PAPILLOMA WITH SCLEROSIS AND APOCRINE CHANGE.  - NEGATIVE FOR ATYPIA AND MALIGNANCY.   GROSS DESCRIPTION:  A. Labeled: Left breast 11:00 3 CMFN  Received: In formalin  Time/date in fixative: 9:13 AM on 08/11/2020  Cold ischemic time: 9:13 AM on 08/11/2020  Total fixation time: 8 hours  Core pieces: Multiple  Size: Aggregate, 1.9 x 0.7 x 0.2 cm  Description: Received are multiple fragments of yellow-tan fibrofatty  tissue, measuring up to 1.9 cm in length and 0.2 cm indiameter  Ink color: Green  Entirely submitted in cassette 1 through 2 as follows:  1: 2 cores  2: remaining specimen   BAS 08/11/20    Final Diagnosis performed by Bryan Lemma, MD.  Electronically signed  08/14/2020 11:35:05AM  The electronic signatureindicates that the named Attending Pathologist  has evaluated the specimen  Technical component performed at [facility], [address], [place],  [place] [address] Lab: [phone] Dir: Rush Farmer, MD, MMM  Professional component performed at [facility], [facility], [place], [place], [place] [address] Lab: [phone]  Dir: Dellia Nims. Reuel Derby, MD  RADS: CLINICAL DATA: Recall from screening mammogram for possible  asymmetry in the left breast.   EXAM:  DIGITAL DIAGNOSTIC UNILATERAL LEFT MAMMOGRAM WITH TOMOSYNTHESIS AND  CAD; ULTRASOUND LEFT BREAST LIMITED   TECHNIQUE:  Left digital diagnostic mammography and breast tomosynthesis was  performed. The images were evaluated with computer-aided  detection.;  Targeted ultrasound examination of the left breast was performed   COMPARISON: Previous exam(s).   ACR Breast Density Category b: There are scattered areas of  fibroglandular density.   FINDINGS:  The previously described asymmetry does not persist with additional  views and likely represents superimposed fibroglandular tissue. No  suspicious mass, microcalcification, or other finding is identified.   Targeted ultrasound was performed in the area of the previously  described asymmetry in the retroareolar left breast and upper inner  left breast.   At 11 o'clock 3 cm from nipple intraductal debris versus an  intraductal mass measures 7 x 3 x 5 mm. This does not have a  definite mammographic correlate.   No abnormally enlarged left axillary lymph node is identified.   IMPRESSION:  Intraductal debris versus intraductal mass in the left breast is  suspicious for malignancy.   RECOMMENDATION:  Recommend ultrasound-guided left breast cyst aspiration and possible  biopsy.   I have discussed the findings and recommendations with the patient.  If applicable, a reminder letter will be sent to the patient  regarding the next appointment.   BI-RADS CATEGORY 4: Suspicious.    Assessment:   Papilloma of left breast [D24.2]  Plan:   1. Papilloma of left breast [D24.2]  Discussed the risk of surgery including recurrence, chronic pain, post-op infxn, poor/delayed wound healing, poor cosmesis, seroma, hematoma formation, and possible re-operation to address said risks. The risks of general anesthetic, if used, includes MI, CVA, sudden death or even reaction to anesthetic medications also discussed.  Typical post-op recovery time and possbility of activity restrictions were also discussed.  Alternatives include continued observation.  Benefits include possible symptom relief, pathologic evaluation, and/or curative excision.   The patient verbalized understanding and  all questions were answered to the patient's satisfaction.  2. Patient has elected to proceed with lumpectomy to ensure no additional pathology within mass. Procedure will be scheduled.  RF tag localized lumpectomy, no SLNB

## 2020-08-21 NOTE — H&P (Signed)
Subjective:   CC: Papilloma of left breast [D24.2] HPI:  Sierra Sparks is a 80 y.o. female who was referred by Allena Earing, MD for evaluation of above. Change was noted on last screening mammogram. Patient does routinely do self breast exams.  Patient has not noted a change on breast exam.  Patient denies nipple discharge. Patient denies previous breast biopsy. Patient denies a personal history of breast cancer.   Past Medical History: HTN  Past Surgical History: gallbladder, c-section, hand surgery  Family History: no reported breast CA in family  Social History:  reports that she has never smoked. She has never used smokeless tobacco. No history on file for alcohol use and drug use.  Current Medications: has a current medication list which includes the following prescription(s): amlodipine, ascorbic acid (vitamin c), calcium carbonate/vitamin d3, fluoxetine, multivitamin, naproxen sodium, and rosuvastatin.  Allergies:     Allergies as of 08/21/2020  . (Not on File)    ROS:  A 15 point review of systems was performed and was negative except as noted in HPI   Objective:   BP (!) 146/85   Pulse 69   Ht 156.2 cm (5' 1.5")   Wt 67.6 kg (149 lb)   BMI 27.70 kg/m   Constitutional :  alert, appears stated age, cooperative and no distress  Lymphatics/Throat:  no asymmetry, masses, or scars  Respiratory:  clear to auscultation bilaterally  Cardiovascular:  regular rate and rhythm  Gastrointestinal: soft, non-tender; bowel sounds normal; no masses,  no organomegaly.   Musculoskeletal: Steady gait and movement  Skin: Cool and moist  Psychiatric: Normal affect, non-agitated, not confused  Breast:  Chaperone present for exam.  breasts appear normal, no suspicious masses, no skin or nipple changes or axillary nodes.    LABS:    SURGICAL PATHOLOGY  SURGICAL PATHOLOGY  CASE: [id number]  PATIENT: Sierra Sparks  Surgical Pathology Report      Specimen  Submitted:  A. Breast, left, 11 o'clock 3 cm from nipple; biopsy   Clinical History: Left breast ultrasound shows intraductal mass versus  debris, no mammographic correlate.  Post biopsy mammograms show appropriate positioning of the Q shaped  biopsy marking clip at the  site of biopsy in the LEFT upper inner breast.      DIAGNOSIS:  A. BREAST, LEFT, 11 O'CLOCK 3 CM FROM NIPPLE; ULTRASOUND-GUIDED CORE  BIOPSY:  - INTRADUCTAL PAPILLOMA WITH SCLEROSIS AND APOCRINE CHANGE.  - NEGATIVE FOR ATYPIA AND MALIGNANCY.   GROSS DESCRIPTION:  A. Labeled: Left breast 11:00 3 CMFN  Received: In formalin  Time/date in fixative: 9:13 AM on 08/11/2020  Cold ischemic time: 9:13 AM on 08/11/2020  Total fixation time: 8 hours  Core pieces: Multiple  Size: Aggregate, 1.9 x 0.7 x 0.2 cm  Description: Received are multiple fragments of yellow-tan fibrofatty  tissue, measuring up to 1.9 cm in length and 0.2 cm indiameter  Ink color: Green  Entirely submitted in cassette 1 through 2 as follows:  1: 2 cores  2: remaining specimen   BAS 08/11/20    Final Diagnosis performed by Bryan Lemma, MD.  Electronically signed  08/14/2020 11:35:05AM  The electronic signatureindicates that the named Attending Pathologist  has evaluated the specimen  Technical component performed at [facility], [address], [place],  [place] [address] Lab: [phone] Dir: Rush Farmer, MD, MMM  Professional component performed at [facility], [facility], [place], [place], [place] [address] Lab: [phone]  Dir: Dellia Nims. Reuel Derby, MD  RADS: CLINICAL DATA: Recall from screening mammogram for possible  asymmetry in the left breast.   EXAM:  DIGITAL DIAGNOSTIC UNILATERAL LEFT MAMMOGRAM WITH TOMOSYNTHESIS AND  CAD; ULTRASOUND LEFT BREAST LIMITED   TECHNIQUE:  Left digital diagnostic mammography and breast tomosynthesis was  performed. The images were evaluated with computer-aided  detection.;  Targeted ultrasound examination of the left breast was performed   COMPARISON: Previous exam(s).   ACR Breast Density Category b: There are scattered areas of  fibroglandular density.   FINDINGS:  The previously described asymmetry does not persist with additional  views and likely represents superimposed fibroglandular tissue. No  suspicious mass, microcalcification, or other finding is identified.   Targeted ultrasound was performed in the area of the previously  described asymmetry in the retroareolar left breast and upper inner  left breast.   At 11 o'clock 3 cm from nipple intraductal debris versus an  intraductal mass measures 7 x 3 x 5 mm. This does not have a  definite mammographic correlate.   No abnormally enlarged left axillary lymph node is identified.   IMPRESSION:  Intraductal debris versus intraductal mass in the left breast is  suspicious for malignancy.   RECOMMENDATION:  Recommend ultrasound-guided left breast cyst aspiration and possible  biopsy.   I have discussed the findings and recommendations with the patient.  If applicable, a reminder letter will be sent to the patient  regarding the next appointment.   BI-RADS CATEGORY 4: Suspicious.    Assessment:   Papilloma of left breast [D24.2]  Plan:   1. Papilloma of left breast [D24.2]  Discussed the risk of surgery including recurrence, chronic pain, post-op infxn, poor/delayed wound healing, poor cosmesis, seroma, hematoma formation, and possible re-operation to address said risks. The risks of general anesthetic, if used, includes MI, CVA, sudden death or even reaction to anesthetic medications also discussed.  Typical post-op recovery time and possbility of activity restrictions were also discussed.  Alternatives include continued observation.  Benefits include possible symptom relief, pathologic evaluation, and/or curative excision.   The patient verbalized understanding and  all questions were answered to the patient's satisfaction.  2. Patient has elected to proceed with lumpectomy to ensure no additional pathology within mass. Procedure will be scheduled.  RF tag localized lumpectomy, no SLNB

## 2020-08-22 ENCOUNTER — Other Ambulatory Visit: Payer: Self-pay | Admitting: Surgery

## 2020-08-22 DIAGNOSIS — D242 Benign neoplasm of left breast: Secondary | ICD-10-CM

## 2020-08-24 ENCOUNTER — Other Ambulatory Visit: Payer: Self-pay

## 2020-08-24 ENCOUNTER — Ambulatory Visit
Admission: RE | Admit: 2020-08-24 | Discharge: 2020-08-24 | Disposition: A | Discharge status: Home / Self Care | Payer: Medicare PPO | Source: Ambulatory Visit | Attending: Surgery | Admitting: Surgery

## 2020-08-24 ENCOUNTER — Encounter
Admission: RE | Admit: 2020-08-24 | Discharge: 2020-08-24 | Disposition: A | Discharge status: Home / Self Care | Payer: Medicare PPO | Source: Ambulatory Visit | Attending: Surgery | Admitting: Surgery

## 2020-08-24 DIAGNOSIS — D242 Benign neoplasm of left breast: Secondary | ICD-10-CM | POA: Diagnosis not present

## 2020-08-24 DIAGNOSIS — R928 Other abnormal and inconclusive findings on diagnostic imaging of breast: Secondary | ICD-10-CM | POA: Diagnosis not present

## 2020-08-24 DIAGNOSIS — I10 Essential (primary) hypertension: Secondary | ICD-10-CM | POA: Diagnosis not present

## 2020-08-24 NOTE — Patient Instructions (Signed)
Your procedure is scheduled on: Aug 25, 2020 Friday  Report to the Registration Desk on the 1st floor of the [facility]. To find out your arrival time, please call [phone] between 1PM - 3PM on: Aug 24, 2020  REMEMBER: Instructions that are not followed completely may result in serious medical risk, up to and including death; or upon the discretion of your surgeon and anesthesiologist your surgery may need to be rescheduled.  Do not eat food after midnight the night before surgery.  No gum chewing, lozengers or hard candies.  You may however, drink CLEAR liquids up to 2 hours before you are scheduled to arrive for your surgery. Do not drink anything within 2 hours of your scheduled arrival time.  Clear liquids include: - water  - apple juice without pulp - gatorade (not RED, PURPLE, OR BLUE) - black coffee or tea (Do NOT add milk or creamers to the coffee or tea) Do NOT drink anything that is not on this list.  drinking 2 hours prior to scheduled arrival time.  TAKE THESE MEDICATIONS THE MORNING OF SURGERY WITH A SIP OF WATER: AMLODIPINE FLUOXETINE ROSUVASTATIN  One week prior to surgery: Stop Anti-inflammatories (NSAIDS) such as Advil, Aleve, Ibuprofen, Motrin, Naproxen, Naprosyn and ASPIRIN OR  Aspirin based products such as Excedrin, Goodys Powder, BC Powder. Stop ANY OVER THE COUNTER supplements until after surgery.  No Alcohol for 24 hours before or after surgery.  No Smoking including e-cigarettes for 24 hours prior to surgery.  No chewable tobacco products for at least 6 hours prior to surgery.  No nicotine patches on the day of surgery.  Do not use any "recreational" drugs for at least a week prior to your surgery.  Please be advised that the combination of cocaine and anesthesia may have negative outcomes, up to and including death. If you test positive for cocaine, your surgery will be cancelled.  On the morning of surgery brush your teeth with toothpaste and  water, you may rinse your mouth with mouthwash if you wish. Do not swallow any toothpaste or mouthwash.  Do not wear jewelry, make-up, hairpins, clips or nail polish.  Do not wear lotions, powders, or perfumes OR DEODORANT   Do not shave body from the neck down 48 hours prior to surgery just in case you cut yourself which could leave a site for infection.  Also, freshly shaved skin may become irritated if using the CHG soap.  Contact lenses, hearing aids and dentures may not be worn into surgery.  Do not bring valuables to the hospital. [facility] is not responsible for any missing/lost belongings or valuables.   SHOWER MORNING OF SURGERY  Notify your doctor if there is any change in your medical condition (cold, fever, infection).  Wear comfortable clothing (specific to your surgery type) to the hospital.  Plan for stool softeners for home use; pain medications have a tendency to cause constipation. You can also help prevent constipation by eating foods high in fiber such as fruits and vegetables and drinking plenty of fluids as your diet allows.  After surgery, you can help prevent lung complications by doing breathing exercises.  Take deep breaths and cough every 1-2 hours. Your doctor may order a device called an Incentive Spirometer to help you take deep breaths. When coughing or sneezing, hold a pillow firmly against your incision with both hands. This is called "splinting." Doing this helps protect your incision. It also decreases belly discomfort.  If you are being  discharged the day of surgery, you will not be allowed to drive home. You will need a responsible adult (18 years or older) to drive you home and stay with you that night.   Please call the [facility] at [phone] if you have any questions about these instructions.  Surgery Visitation Policy:  Patients undergoing a surgery or procedure may have one family member or support person with them  as long as that person is not COVID-19 positive or experiencing its symptoms.  That person may remain in the waiting area during the procedure.  Inpatient Visitation:    Visiting hours are 7 a.m. to 8 p.m. Inpatients will be allowed two visitors daily. The visitors may change each day during the patient's stay. No visitors under the age of 59. Any visitor under the age of 81 must be accompanied by an adult. The visitor must pass COVID-19 screenings, use hand sanitizer when entering and exiting the patient's room and wear a mask at all times, including in the patient's room. Patients must also wear a mask when staff or their visitor are in the room. Masking is required regardless of vaccination status.

## 2020-08-25 ENCOUNTER — Ambulatory Visit: Payer: Medicare PPO | Admitting: Anesthesiology

## 2020-08-25 ENCOUNTER — Encounter
Admission: RE | Disposition: A | Discharge status: Home / Self Care | Payer: Self-pay | Source: Home / Self Care | Attending: Surgery

## 2020-08-25 ENCOUNTER — Ambulatory Visit
Admission: RE | Admit: 2020-08-25 | Discharge: 2020-08-25 | Disposition: A | Discharge status: Home / Self Care | Payer: Medicare PPO | Attending: Surgery | Admitting: Surgery

## 2020-08-25 ENCOUNTER — Other Ambulatory Visit: Payer: Self-pay

## 2020-08-25 ENCOUNTER — Encounter: Payer: Self-pay | Admitting: Surgery

## 2020-08-25 ENCOUNTER — Ambulatory Visit
Admission: RE | Admit: 2020-08-25 | Discharge: 2020-08-25 | Disposition: A | Discharge status: Home / Self Care | Payer: Medicare PPO | Source: Ambulatory Visit | Attending: Surgery | Admitting: Surgery

## 2020-08-25 DIAGNOSIS — R928 Other abnormal and inconclusive findings on diagnostic imaging of breast: Secondary | ICD-10-CM | POA: Diagnosis not present

## 2020-08-25 DIAGNOSIS — I10 Essential (primary) hypertension: Secondary | ICD-10-CM | POA: Insufficient documentation

## 2020-08-25 DIAGNOSIS — D242 Benign neoplasm of left breast: Secondary | ICD-10-CM | POA: Insufficient documentation

## 2020-08-25 HISTORY — PX: BREAST LUMPECTOMY WITH RADIO FREQUENCY LOCALIZER: SHX6897

## 2020-08-25 SURGERY — BREAST LUMPECTOMY WITH RADIO FREQUENCY LOCALIZER
Anesthesia: General | Site: Breast | Laterality: Left

## 2020-08-25 MED ORDER — HYDROCODONE-ACETAMINOPHEN 5-325 MG PO TABS: 1.0000 | ORAL_TABLET | Freq: Four times a day (QID) | ORAL | 0 refills | Status: DC | PRN

## 2020-08-25 MED ORDER — ONDANSETRON HCL 4 MG/2ML IJ SOLN
INTRAMUSCULAR | Status: DC | PRN
  Administered 2020-08-25: 4 mg via INTRAVENOUS

## 2020-08-25 MED ORDER — CHLORHEXIDINE GLUCONATE 0.12 % MT SOLN: 15.0000 mL | Freq: Once | OROMUCOSAL | Status: AC

## 2020-08-25 MED ORDER — EPHEDRINE 5 MG/ML INJ
INTRAVENOUS | Status: AC
  Filled 2020-08-25: qty 10

## 2020-08-25 MED ORDER — ONDANSETRON HCL 4 MG/2ML IJ SOLN
INTRAMUSCULAR | Status: AC
  Filled 2020-08-25: qty 2

## 2020-08-25 MED ORDER — PROPOFOL 10 MG/ML IV BOLUS
INTRAVENOUS | Status: DC | PRN
  Administered 2020-08-25: 120 mg via INTRAVENOUS

## 2020-08-25 MED ORDER — DEXAMETHASONE SODIUM PHOSPHATE 10 MG/ML IJ SOLN
INTRAMUSCULAR | Status: AC
  Filled 2020-08-25: qty 1

## 2020-08-25 MED ORDER — FENTANYL CITRATE (PF) 100 MCG/2ML IJ SOLN
INTRAMUSCULAR | Status: DC | PRN
  Administered 2020-08-25: 50 ug via INTRAVENOUS

## 2020-08-25 MED ORDER — CHLORHEXIDINE GLUCONATE 0.12 % MT SOLN
OROMUCOSAL | Status: AC
  Administered 2020-08-25: 15 mL via OROMUCOSAL
  Filled 2020-08-25: qty 15

## 2020-08-25 MED ORDER — LIDOCAINE HCL (PF) 1 % IJ SOLN
INTRAMUSCULAR | Status: DC | PRN
  Administered 2020-08-25: 10 mL

## 2020-08-25 MED ORDER — EPHEDRINE SULFATE 50 MG/ML IJ SOLN
INTRAMUSCULAR | Status: DC | PRN
  Administered 2020-08-25 (×4): 5 mg via INTRAVENOUS

## 2020-08-25 MED ORDER — CEFAZOLIN SODIUM-DEXTROSE 2-4 GM/100ML-% IV SOLN
INTRAVENOUS | Status: AC
  Filled 2020-08-25: qty 100

## 2020-08-25 MED ORDER — BUPIVACAINE-EPINEPHRINE 0.5% -1:200000 IJ SOLN
INTRAMUSCULAR | Status: DC | PRN
  Administered 2020-08-25: 10 mL

## 2020-08-25 MED ORDER — LIDOCAINE HCL (PF) 2 % IJ SOLN
INTRAMUSCULAR | Status: AC
  Filled 2020-08-25: qty 5

## 2020-08-25 MED ORDER — ACETAMINOPHEN 325 MG PO TABS: 650.0000 mg | ORAL_TABLET | Freq: Three times a day (TID) | ORAL | 0 refills | Status: AC | PRN

## 2020-08-25 MED ORDER — FAMOTIDINE 20 MG PO TABS
ORAL_TABLET | ORAL | Status: AC
  Administered 2020-08-25: 20 mg via ORAL
  Filled 2020-08-25: qty 1

## 2020-08-25 MED ORDER — BUPIVACAINE-EPINEPHRINE (PF) 0.5% -1:200000 IJ SOLN
INTRAMUSCULAR | Status: AC
  Filled 2020-08-25: qty 30

## 2020-08-25 MED ORDER — LIDOCAINE HCL (PF) 1 % IJ SOLN
INTRAMUSCULAR | Status: AC
  Filled 2020-08-25: qty 30

## 2020-08-25 MED ORDER — CHLORHEXIDINE GLUCONATE CLOTH 2 % EX PADS
6.0000 | MEDICATED_PAD | Freq: Once | CUTANEOUS | Status: AC
  Administered 2020-08-25: 6 via TOPICAL

## 2020-08-25 MED ORDER — CEFAZOLIN SODIUM-DEXTROSE 2-4 GM/100ML-% IV SOLN
2.0000 g | INTRAVENOUS | Status: AC
  Administered 2020-08-25: 2 g via INTRAVENOUS

## 2020-08-25 MED ORDER — DEXAMETHASONE SODIUM PHOSPHATE 10 MG/ML IJ SOLN
INTRAMUSCULAR | Status: DC | PRN
  Administered 2020-08-25: 5 mg via INTRAVENOUS

## 2020-08-25 MED ORDER — ORAL CARE MOUTH RINSE: 15.0000 mL | Freq: Once | OROMUCOSAL | Status: AC

## 2020-08-25 MED ORDER — FAMOTIDINE 20 MG PO TABS: 20.0000 mg | ORAL_TABLET | Freq: Once | ORAL | Status: AC

## 2020-08-25 MED ORDER — LACTATED RINGERS IV SOLN: INTRAVENOUS | Status: DC

## 2020-08-25 MED ORDER — LIDOCAINE HCL (CARDIAC) PF 100 MG/5ML IV SOSY
PREFILLED_SYRINGE | INTRAVENOUS | Status: DC | PRN
  Administered 2020-08-25: 60 mg via INTRAVENOUS

## 2020-08-25 MED ORDER — ONDANSETRON HCL 4 MG/2ML IJ SOLN: 4.0000 mg | Freq: Once | INTRAMUSCULAR | Status: DC | PRN

## 2020-08-25 MED ORDER — FENTANYL CITRATE (PF) 100 MCG/2ML IJ SOLN
INTRAMUSCULAR | Status: AC
  Filled 2020-08-25: qty 2

## 2020-08-25 MED ORDER — FENTANYL CITRATE (PF) 100 MCG/2ML IJ SOLN
25.0000 ug | INTRAMUSCULAR | Status: DC | PRN
Start: 2020-08-25 — End: 2020-08-25

## 2020-08-25 SURGICAL SUPPLY — 50 items
ADH SKN CLS APL DERMABOND .7 (GAUZE/BANDAGES/DRESSINGS) ×1
APL PRP STRL LF DISP 70% ISPRP (MISCELLANEOUS) ×1
APPLIER CLIP 11 MED OPEN (CLIP)
APR CLP MED 11 20 MLT OPN (CLIP)
BLADE SURG 15 STRL LF DISP TIS (BLADE) ×1 IMPLANT
BLADE SURG 15 STRL SS (BLADE) ×2
CANISTER SUCT 1200ML W/VALVE (MISCELLANEOUS) ×2 IMPLANT
CHLORAPREP W/TINT 26 (MISCELLANEOUS) ×2 IMPLANT
CLIP APPLIE 11 MED OPEN (CLIP) IMPLANT
CNTNR SPEC 2.5X3XGRAD LEK (MISCELLANEOUS) ×1
CONT SPEC 4OZ STER OR WHT (MISCELLANEOUS) ×1
CONT SPEC 4OZ STRL OR WHT (MISCELLANEOUS) ×1
CONTAINER SPEC 2.5X3XGRAD LEK (MISCELLANEOUS) ×1 IMPLANT
COVER WAND RF STERILE (DRAPES) ×2 IMPLANT
DERMABOND ADVANCED (GAUZE/BANDAGES/DRESSINGS) ×1
DERMABOND ADVANCED .7 DNX12 (GAUZE/BANDAGES/DRESSINGS) ×1 IMPLANT
DEVICE DSSCT PLSMBLD 3.0S LGHT (MISCELLANEOUS) ×1 IMPLANT
DEVICE DUBIN SPECIMEN MAMMOGRA (MISCELLANEOUS) ×2 IMPLANT
DRAPE LAPAROTOMY TRNSV 106X77 (MISCELLANEOUS) ×2 IMPLANT
ELECT CAUTERY BLADE TIP 2.5 (TIP) ×2
ELECT REM PT RETURN 9FT ADLT (ELECTROSURGICAL) ×2
ELECTRODE CAUTERY BLDE TIP 2.5 (TIP) ×1 IMPLANT
ELECTRODE REM PT RTRN 9FT ADLT (ELECTROSURGICAL) ×1 IMPLANT
GLOVE SURG SYN 6.5 ES PF (GLOVE) ×4 IMPLANT
GLOVE SURG SYN 6.5 PF PI (GLOVE) ×1 IMPLANT
GLOVE SURG UNDER POLY LF SZ7 (GLOVE) ×3 IMPLANT
GOWN STRL REUS W/ TWL LRG LVL3 (GOWN DISPOSABLE) ×3 IMPLANT
GOWN STRL REUS W/TWL LRG LVL3 (GOWN DISPOSABLE) ×4
JACKSON PRATT 10 (INSTRUMENTS) IMPLANT
KIT MARKER MARGIN INK (KITS) ×1 IMPLANT
KIT TURNOVER KIT A (KITS) ×2 IMPLANT
LABEL OR SOLS (LABEL) ×2 IMPLANT
LIGHT WAVEGUIDE WIDE FLAT (MISCELLANEOUS) IMPLANT
MANIFOLD NEPTUNE II (INSTRUMENTS) ×2 IMPLANT
MARKER MARGIN CORRECT CLIP (MARKER) ×1 IMPLANT
NEEDLE HYPO 22GX1.5 SAFETY (NEEDLE) ×4 IMPLANT
PACK BASIN MINOR ARMC (MISCELLANEOUS) ×2 IMPLANT
PLASMABLADE 3.0S W/LIGHT (MISCELLANEOUS) ×2
SET LOCALIZER 20 PROBE US (MISCELLANEOUS) ×2 IMPLANT
SUT MNCRL 4-0 (SUTURE) ×2
SUT MNCRL 4-0 27XMFL (SUTURE) ×1
SUT SILK 2 0 (SUTURE)
SUT SILK 2-0 30XBRD TIE 12 (SUTURE) IMPLANT
SUT SILK 3 0 12 30 (SUTURE) IMPLANT
SUT VIC AB 3-0 SH 27 (SUTURE) ×2
SUT VIC AB 3-0 SH 27X BRD (SUTURE) ×2 IMPLANT
SUTURE MNCRL 4-0 27XMF (SUTURE) ×2 IMPLANT
SYR 20ML LL LF (SYRINGE) ×2 IMPLANT
TUBING CONNECTING 10 (TUBING) ×2 IMPLANT
WATER STERILE IRR 1000ML POUR (IV SOLUTION) ×2 IMPLANT

## 2020-08-25 NOTE — Interval H&P Note (Signed)
History and Physical Interval Note:  08/25/2020 9:41 AM  Sierra Sparks  has presented today for surgery, with the diagnosis of D24.2 Papilloma of Lt breast.  The various methods of treatment have been discussed with the patient and family. After consideration of risks, benefits and other options for treatment, the patient has consented to  Procedure(s): BREAST LUMPECTOMY WITH RADIO FREQUENCY LOCALIZER (Left) as a surgical intervention.  The patient's history has been reviewed, patient examined, no change in status, stable for surgery.  I have reviewed the patient's chart and labs.  Questions were answered to the patient's satisfaction.     Rasaan Brotherton Lysle Pearl

## 2020-08-25 NOTE — Op Note (Signed)
Preoperative diagnosis:  Left breast papilloma Postoperative diagnosis: same.   Procedure: RF tag-localized left  breast lumpectomy  Anesthesia: GETA  Surgeon: Dr. Benjamine Sprague  Wound Classification: Clean  Indications: Patient is a 80 y.o. female with a nonpalpable left breast mass noted on mammography with core biopsy demonstrating papilloma. requires RF localizer placement, partial mastectomy for treatment with sentinel lymph node biopsy.   Specimen: left Breast mass  Complications: None  Estimated Blood Loss: 110mL  Findings: 1. Specimen mammography shows marker and RF localizer on specimen 2. Pathology call refers gross examination of margins was negative 3. No other palpable mass or lymph node identified.    Description of procedure: RF localization was performed by radiology prior to procedure. The patient was taken to the operating room and placed supine on the operating table, and after general anesthesia the left breast and axilla were prepped and draped in the usual sterile fashion. A time-out was completed verifying correct patient, procedure, site, positioning, and implant(s) and/or special equipment prior to beginning this procedure.  By identifying the RF localizer, the probable trajectory and location of the mass was visualized. A skin incision was planned in such a way as to minimize the amount of dissection to reach the mass.  The skin incision was made after infusion of local. Flaps were raised and  Sharp and blunt dissection was then taken down to the mass, taking care to include the entire RF localizer and a margin of grossly normal tissue. The specimen was removed. The specimen was oriented with paint and sent to radiology with the localization studies. Confirmation was received that the entire target lesion had been resected. Wound irrigated, hemostasis was achieved and the wound closed in layers with  interrupted sutures of 3-0 Vicryl in deep dermal layer and a  running subcuticular suture of Monocryl 4-0, then dressed with dermabond. The patient tolerated the procedure well and was taken to the postanesthesia care unit in stable condition. Sponge and instrument count correct at end of procedure.

## 2020-08-25 NOTE — Anesthesia Procedure Notes (Signed)
Procedure Name: LMA Insertion Date/Time: 08/25/2020 10:16 AM Performed by: Zetta Bills, CRNA Pre-anesthesia Checklist: Patient identified, Emergency Drugs available, Suction available and Patient being monitored Patient Re-evaluated:Patient Re-evaluated prior to induction Oxygen Delivery Method: Circle system utilized Preoxygenation: Pre-oxygenation with 100% oxygen Induction Type: IV induction Ventilation: Mask ventilation without difficulty LMA: LMA inserted LMA Size: 3.5 Tube type: Oral Number of attempts: 1 Placement Confirmation: positive ETCO2 Tube secured with: Tape Dental Injury: Teeth and Oropharynx as per pre-operative assessment

## 2020-08-25 NOTE — Anesthesia Preprocedure Evaluation (Signed)
Anesthesia Evaluation  Patient identified by MRN, date of birth, ID band Patient awake    Reviewed: Allergy & Precautions, NPO status , Patient's Chart, lab work & pertinent test results  Airway Mallampati: III  TM Distance: <3 FB     Dental  (+) Chipped   Pulmonary neg pulmonary ROS,    Pulmonary exam normal        Cardiovascular hypertension, Normal cardiovascular exam     Neuro/Psych PSYCHIATRIC DISORDERS Anxiety Depression negative neurological ROS     GI/Hepatic Neg liver ROS, GERD  Controlled,  Endo/Other  negative endocrine ROS  Renal/GU negative Renal ROS  negative genitourinary   Musculoskeletal  (+) Arthritis , Osteoarthritis,    Abdominal Normal abdominal exam  (+)   Peds negative pediatric ROS (+)  Hematology  (+) anemia ,   Anesthesia Other Findings Past Medical History: No date: Allergic rhinitis, cause unspecified No date: Allergy No date: Anemia No date: Anxiety state, unspecified No date: Cataract     Comment:  forming  No date: Colon polyps No date: Depressive disorder, not elsewhere classified No date: Diffuse cystic mastopathy No date: Disorder of bone and cartilage, unspecified No date: Diverticulosis No date: External hemorrhoids No date: GERD (gastroesophageal reflux disease) No date: Glaucoma     Comment:  narrow angle thatwas corrected with laser surgery  No date: Osteoarthrosis, unspecified whether generalized or localized,  unspecified site     Comment:  hands No date: Osteoporosis No date: Pure hypercholesterolemia No date: Thyroid disease     Comment:  years past- no meds currently  No date: Unspecified essential hypertension No date: Unspecified hemorrhoids without mention of complication  Reproductive/Obstetrics                             Anesthesia Physical Anesthesia Plan  ASA: II  Anesthesia Plan: General   Post-op Pain Management:     Induction: Intravenous  PONV Risk Score and Plan:   Airway Management Planned: LMA and Oral ETT  Additional Equipment:   Intra-op Plan:   Post-operative Plan: Extubation in OR  Informed Consent: I have reviewed the patients History and Physical, chart, labs and discussed the procedure including the risks, benefits and alternatives for the proposed anesthesia with the patient or authorized representative who has indicated his/her understanding and acceptance.     Dental advisory given  Plan Discussed with: CRNA and Surgeon  Anesthesia Plan Comments:         Anesthesia Quick Evaluation

## 2020-08-25 NOTE — Discharge Instructions (Signed)
AMBULATORY SURGERY  DISCHARGE INSTRUCTIONS   1) The drugs that you were given will stay in your system until tomorrow so for the next 24 hours you should not:  A) Drive an automobile B) Make any legal decisions C) Drink any alcoholic beverage   2) You may resume regular meals tomorrow.  Today it is better to start with liquids and gradually work up to solid foods.  You may eat anything you prefer, but it is better to start with liquids, then soup and crackers, and gradually work up to solid foods.   3) Please notify your doctor immediately if you have any unusual bleeding, trouble breathing, redness and pain at the surgery site, drainage, fever, or pain not relieved by medication.    4) Additional Instructions:        Please contact your physician with any problems or Same Day Surgery at [phone], Monday through Friday 6 am to 4 pm, or [place] at [facility] number at [phone].Removal, Care After This sheet gives you information about how to care for yourself after your procedure. Your health care provider may also give you more specific instructions. If you have problems or questions, contact your health care provider. What can I expect after the procedure? After the procedure, it is common to have:  Soreness.  Bruising.  Itching. Follow these instructions at home: site care Follow instructions from your health care provider about how to take care of your site. Make sure you:  Wash your hands with soap and water before and after you change your bandage (dressing). If soap and water are not available, use hand sanitizer.  Leave stitches (sutures), skin glue, or adhesive strips in place. These skin closures may need to stay in place for 2 weeks or longer. If adhesive strip edges start to loosen and curl up, you may trim the loose edges. Do not remove adhesive strips completely unless your health care provider tells you to do that.  If the area bleeds or  bruises, apply gentle pressure for 10 minutes.  OK TO SHOWER IN 24HRS  Check your site every day for signs of infection. Check for:  Redness, swelling, or pain.  Fluid or blood.  Warmth.  Pus or a bad smell.  General instructions  Rest and then return to your normal activities as told by your health care provider. .  tylenol and alleve as needed for discomfort.  Please alternate between the two every four hours as needed for pain.   .  Use narcotics, if prescribed, only when tylenol and alleve is not enough to control pain. .  325-650mg  every 8hrs to max of 3000mg /24hrs (including the 325mg  in every norco dose) for the tylenol.    Keep all follow-up visits as told by your health care provider. This is important. Contact a health care provider if:  You have redness, swelling, or pain around your site.  You have fluid or blood coming from your site.  Your site feels warm to the touch.  You have pus or a bad smell coming from your site.  You have a fever.  Your sutures, skin glue, or adhesive strips loosen or come off sooner than expected. Get help right away if:  You have bleeding that does not stop with pressure or a dressing. Summary  After the procedure, it is common to have some soreness, bruising, and itching at the site.  Follow instructions from your health care provider about how to take care of your site.  Check your site every day for signs of infection.  Contact a health care provider if you have redness, swelling, or pain around your site, or your site feels warm to the touch.  Keep all follow-up visits as told by your health care provider. This is important. This information is not intended to replace advice given to you by your health care provider. Make sure you discuss any questions you have with your health care provider. Document Released: 04/28/2015 Document Revised: 09/29/2017 Document Reviewed: 09/29/2017 Elsevier Interactive Patient Education   [employer].

## 2020-08-25 NOTE — Transfer of Care (Signed)
Immediate Anesthesia Transfer of Care Note  Patient: Sierra Sparks  Procedure(s) Performed: BREAST LUMPECTOMY WITH RADIO FREQUENCY LOCALIZER (Left Breast)  Patient Location: PACU  Anesthesia Type:General  Level of Consciousness: awake  Airway & Oxygen Therapy: Patient Spontanous Breathing  Post-op Assessment: Report given to RN  Post vital signs: stable  Last Vitals:  Vitals Value Taken Time  BP 135/79 08/25/20 1117  Temp 36.4 C 08/25/20 1115  Pulse 70 08/25/20 1121  Resp 14 08/25/20 1121  SpO2 92 % 08/25/20 1121  Vitals shown include unvalidated device data.  Last Pain:  Vitals:   08/25/20 1115  TempSrc:   PainSc: 0-No pain         Complications: No complications documented.

## 2020-08-28 ENCOUNTER — Encounter: Payer: Self-pay | Admitting: Surgery

## 2020-08-28 NOTE — Anesthesia Postprocedure Evaluation (Signed)
Anesthesia Post Note  Patient: Sierra Sparks  Procedure(s) Performed: BREAST LUMPECTOMY WITH RADIO FREQUENCY LOCALIZER (Left Breast)  Patient location during evaluation: PACU Anesthesia Type: General Level of consciousness: awake and alert and oriented Pain management: pain level controlled Vital Signs Assessment: post-procedure vital signs reviewed and stable Respiratory status: spontaneous breathing Cardiovascular status: blood pressure returned to baseline Anesthetic complications: no   No complications documented.   Last Vitals:  Vitals:   08/25/20 1154 08/25/20 1204  BP:  (!) 146/73  Pulse: 74 79  Resp: 14 15  Temp: 36.9 C 36.6 C  SpO2: 94% 97%    Last Pain:  Vitals:   08/26/20 1224  TempSrc:   PainSc: 2                  Ayson Cherubini

## 2020-08-29 LAB — SURGICAL PATHOLOGY

## 2020-12-12 DIAGNOSIS — H40013 Open angle with borderline findings, low risk, bilateral: Secondary | ICD-10-CM | POA: Diagnosis not present

## 2020-12-12 DIAGNOSIS — H2513 Age-related nuclear cataract, bilateral: Secondary | ICD-10-CM | POA: Diagnosis not present

## 2020-12-12 DIAGNOSIS — H40033 Anatomical narrow angle, bilateral: Secondary | ICD-10-CM | POA: Diagnosis not present

## 2020-12-12 DIAGNOSIS — H35373 Puckering of macula, bilateral: Secondary | ICD-10-CM | POA: Diagnosis not present

## 2020-12-12 DIAGNOSIS — H04561 Stenosis of right lacrimal punctum: Secondary | ICD-10-CM | POA: Diagnosis not present

## 2020-12-28 DIAGNOSIS — M84374A Stress fracture, right foot, initial encounter for fracture: Secondary | ICD-10-CM | POA: Diagnosis not present

## 2021-01-12 DIAGNOSIS — M79671 Pain in right foot: Secondary | ICD-10-CM | POA: Diagnosis not present

## 2021-01-17 DIAGNOSIS — M84374D Stress fracture, right foot, subsequent encounter for fracture with routine healing: Secondary | ICD-10-CM | POA: Diagnosis not present

## 2021-01-17 DIAGNOSIS — S92535A Nondisplaced fracture of distal phalanx of left lesser toe(s), initial encounter for closed fracture: Secondary | ICD-10-CM | POA: Diagnosis not present

## 2021-02-19 DIAGNOSIS — M84374D Stress fracture, right foot, subsequent encounter for fracture with routine healing: Secondary | ICD-10-CM | POA: Diagnosis not present

## 2021-02-27 DIAGNOSIS — R262 Difficulty in walking, not elsewhere classified: Secondary | ICD-10-CM | POA: Diagnosis not present

## 2021-02-27 DIAGNOSIS — M25571 Pain in right ankle and joints of right foot: Secondary | ICD-10-CM | POA: Diagnosis not present

## 2021-02-27 DIAGNOSIS — M84374D Stress fracture, right foot, subsequent encounter for fracture with routine healing: Secondary | ICD-10-CM | POA: Diagnosis not present

## 2021-02-27 DIAGNOSIS — M6281 Muscle weakness (generalized): Secondary | ICD-10-CM | POA: Diagnosis not present

## 2021-03-01 DIAGNOSIS — M6281 Muscle weakness (generalized): Secondary | ICD-10-CM | POA: Diagnosis not present

## 2021-03-01 DIAGNOSIS — M84374D Stress fracture, right foot, subsequent encounter for fracture with routine healing: Secondary | ICD-10-CM | POA: Diagnosis not present

## 2021-03-01 DIAGNOSIS — R262 Difficulty in walking, not elsewhere classified: Secondary | ICD-10-CM | POA: Diagnosis not present

## 2021-03-01 DIAGNOSIS — M25571 Pain in right ankle and joints of right foot: Secondary | ICD-10-CM | POA: Diagnosis not present

## 2021-03-06 DIAGNOSIS — M6281 Muscle weakness (generalized): Secondary | ICD-10-CM | POA: Diagnosis not present

## 2021-03-06 DIAGNOSIS — R262 Difficulty in walking, not elsewhere classified: Secondary | ICD-10-CM | POA: Diagnosis not present

## 2021-03-06 DIAGNOSIS — M25571 Pain in right ankle and joints of right foot: Secondary | ICD-10-CM | POA: Diagnosis not present

## 2021-03-06 DIAGNOSIS — M84374D Stress fracture, right foot, subsequent encounter for fracture with routine healing: Secondary | ICD-10-CM | POA: Diagnosis not present

## 2021-03-28 DIAGNOSIS — Z85828 Personal history of other malignant neoplasm of skin: Secondary | ICD-10-CM | POA: Diagnosis not present

## 2021-03-28 DIAGNOSIS — L249 Irritant contact dermatitis, unspecified cause: Secondary | ICD-10-CM | POA: Diagnosis not present

## 2021-03-28 DIAGNOSIS — L814 Other melanin hyperpigmentation: Secondary | ICD-10-CM | POA: Diagnosis not present

## 2021-03-28 DIAGNOSIS — I872 Venous insufficiency (chronic) (peripheral): Secondary | ICD-10-CM | POA: Diagnosis not present

## 2021-03-28 DIAGNOSIS — L820 Inflamed seborrheic keratosis: Secondary | ICD-10-CM | POA: Diagnosis not present

## 2021-03-28 DIAGNOSIS — L821 Other seborrheic keratosis: Secondary | ICD-10-CM | POA: Diagnosis not present

## 2021-03-28 DIAGNOSIS — L538 Other specified erythematous conditions: Secondary | ICD-10-CM | POA: Diagnosis not present

## 2021-03-28 DIAGNOSIS — D225 Melanocytic nevi of trunk: Secondary | ICD-10-CM | POA: Diagnosis not present

## 2021-03-28 DIAGNOSIS — Z08 Encounter for follow-up examination after completed treatment for malignant neoplasm: Secondary | ICD-10-CM | POA: Diagnosis not present

## 2021-04-15 DIAGNOSIS — I499 Cardiac arrhythmia, unspecified: Secondary | ICD-10-CM

## 2021-04-15 HISTORY — DX: Cardiac arrhythmia, unspecified: I49.9

## 2021-05-08 DIAGNOSIS — H35373 Puckering of macula, bilateral: Secondary | ICD-10-CM | POA: Diagnosis not present

## 2021-05-08 DIAGNOSIS — H40013 Open angle with borderline findings, low risk, bilateral: Secondary | ICD-10-CM | POA: Diagnosis not present

## 2021-05-08 DIAGNOSIS — H2513 Age-related nuclear cataract, bilateral: Secondary | ICD-10-CM | POA: Diagnosis not present

## 2021-05-08 DIAGNOSIS — H40033 Anatomical narrow angle, bilateral: Secondary | ICD-10-CM | POA: Diagnosis not present

## 2021-05-08 DIAGNOSIS — H04561 Stenosis of right lacrimal punctum: Secondary | ICD-10-CM | POA: Diagnosis not present

## 2021-05-10 ENCOUNTER — Telehealth: Payer: Self-pay | Admitting: Family Medicine

## 2021-05-10 DIAGNOSIS — E7800 Pure hypercholesterolemia, unspecified: Secondary | ICD-10-CM

## 2021-05-10 DIAGNOSIS — I10 Essential (primary) hypertension: Secondary | ICD-10-CM

## 2021-05-10 NOTE — Telephone Encounter (Signed)
-----   Message from Ellamae Sia sent at 04/23/2021  8:45 AM EST ----- Regarding: Lab orders for Friday, 1.27.23 Patient is scheduled for CPX labs, please order future labs, Thanks , Karna Christmas

## 2021-05-11 ENCOUNTER — Other Ambulatory Visit: Payer: Self-pay

## 2021-05-11 ENCOUNTER — Other Ambulatory Visit (INDEPENDENT_AMBULATORY_CARE_PROVIDER_SITE_OTHER): Payer: Medicare PPO

## 2021-05-11 DIAGNOSIS — E7800 Pure hypercholesterolemia, unspecified: Secondary | ICD-10-CM

## 2021-05-11 DIAGNOSIS — I10 Essential (primary) hypertension: Secondary | ICD-10-CM | POA: Diagnosis not present

## 2021-05-11 LAB — CBC WITH DIFFERENTIAL/PLATELET
Basophils Absolute: 0 10*3/uL (ref 0.0–0.1)
Basophils Relative: 0.9 % (ref 0.0–3.0)
Eosinophils Absolute: 0 10*3/uL (ref 0.0–0.7)
Eosinophils Relative: 1 % (ref 0.0–5.0)
HCT: 27.3 % — ABNORMAL LOW (ref 36.0–46.0)
Hemoglobin: 8.5 g/dL — ABNORMAL LOW (ref 12.0–15.0)
Lymphocytes Relative: 31.9 % (ref 12.0–46.0)
Lymphs Abs: 1.4 10*3/uL (ref 0.7–4.0)
MCHC: 31.1 g/dL (ref 30.0–36.0)
MCV: 70 fl — ABNORMAL LOW (ref 78.0–100.0)
Monocytes Absolute: 0.5 10*3/uL (ref 0.1–1.0)
Monocytes Relative: 12.1 % — ABNORMAL HIGH (ref 3.0–12.0)
Neutro Abs: 2.3 10*3/uL (ref 1.4–7.7)
Neutrophils Relative %: 54.1 % (ref 43.0–77.0)
Platelets: 287 10*3/uL (ref 150.0–400.0)
RBC: 3.9 Mil/uL (ref 3.87–5.11)
RDW: 18.8 % — ABNORMAL HIGH (ref 11.5–15.5)
WBC: 4.3 10*3/uL (ref 4.0–10.5)

## 2021-05-11 LAB — COMPREHENSIVE METABOLIC PANEL
ALT: 13 U/L (ref 0–35)
AST: 19 U/L (ref 0–37)
Albumin: 4.1 g/dL (ref 3.5–5.2)
Alkaline Phosphatase: 81 U/L (ref 39–117)
BUN: 15 mg/dL (ref 6–23)
CO2: 29 mEq/L (ref 19–32)
Calcium: 9.2 mg/dL (ref 8.4–10.5)
Chloride: 107 mEq/L (ref 96–112)
Creatinine, Ser: 0.73 mg/dL (ref 0.40–1.20)
GFR: 78.77 mL/min (ref >60.00)
Glucose, Bld: 102 mg/dL — ABNORMAL HIGH (ref 70–99)
Potassium: 4.3 mEq/L (ref 3.5–5.1)
Sodium: 141 mEq/L (ref 135–145)
Total Bilirubin: 0.3 mg/dL (ref 0.2–1.2)
Total Protein: 6.6 g/dL (ref 6.0–8.3)

## 2021-05-11 LAB — LIPID PANEL
Cholesterol: 130 mg/dL (ref 0–200)
HDL: 49.1 mg/dL (ref >39.00)
LDL Cholesterol: 60 mg/dL (ref 0–99)
NonHDL: 80.45
Total CHOL/HDL Ratio: 3
Triglycerides: 101 mg/dL (ref 0.0–149.0)
VLDL: 20.2 mg/dL (ref 0.0–40.0)

## 2021-05-11 LAB — TSH: TSH: 2.07 u[IU]/mL (ref 0.35–5.50)

## 2021-05-18 ENCOUNTER — Ambulatory Visit (INDEPENDENT_AMBULATORY_CARE_PROVIDER_SITE_OTHER): Payer: Medicare PPO | Admitting: Family Medicine

## 2021-05-18 ENCOUNTER — Other Ambulatory Visit: Payer: Self-pay

## 2021-05-18 ENCOUNTER — Encounter: Payer: Self-pay | Admitting: Family Medicine

## 2021-05-18 VITALS — BP 130/82 | HR 82 | Temp 97.0°F | Ht 61.5 in | Wt 168.1 lb

## 2021-05-18 DIAGNOSIS — M8589 Other specified disorders of bone density and structure, multiple sites: Secondary | ICD-10-CM

## 2021-05-18 DIAGNOSIS — Z1211 Encounter for screening for malignant neoplasm of colon: Secondary | ICD-10-CM

## 2021-05-18 DIAGNOSIS — F418 Other specified anxiety disorders: Secondary | ICD-10-CM | POA: Diagnosis not present

## 2021-05-18 DIAGNOSIS — E7800 Pure hypercholesterolemia, unspecified: Secondary | ICD-10-CM

## 2021-05-18 DIAGNOSIS — E2839 Other primary ovarian failure: Secondary | ICD-10-CM

## 2021-05-18 DIAGNOSIS — D649 Anemia, unspecified: Secondary | ICD-10-CM

## 2021-05-18 DIAGNOSIS — I10 Essential (primary) hypertension: Secondary | ICD-10-CM

## 2021-05-18 DIAGNOSIS — Z0000 Encounter for general adult medical examination without abnormal findings: Secondary | ICD-10-CM | POA: Diagnosis not present

## 2021-05-18 LAB — FERRITIN: Ferritin: 8 ng/mL — ABNORMAL LOW (ref 10.0–291.0)

## 2021-05-18 LAB — IRON: Iron: 43 ug/dL (ref 42–145)

## 2021-05-18 NOTE — Progress Notes (Signed)
Subjective:    Patient ID: Sierra Sparks, female    DOB: [birth year], 81 y.o.   MRN: [id number]  This visit occurred during the SARS-CoV-2 public health emergency.  Safety protocols were in place, including screening questions prior to the visit, additional usage of staff PPE, and extensive cleaning of exam room while observing appropriate contact time as indicated for disinfecting solutions.   HPI Pt presents for amw and health mt visit   I have personally reviewed the Medicare Annual Wellness questionnaire and have noted 1. The patient's medical and social history 2. Their use of alcohol, tobacco or illicit drugs 3. Their current medications and supplements 4. The patient's functional ability including ADL's, fall risks, home safety risks and hearing or visual             impairment. 5. Diet and physical activities 6. Evidence for depression or mood disorders  The patients weight, height, BMI have been recorded in the chart and visual acuity is per eye clinic.  I have made referrals, counseling and provided education to the patient based review of the above and I have provided the pt with a written personalized care plan for preventive services. Reviewed and updated provider list, see scanned forms.  See scanned forms.  Routine anticipatory guidance given to patient.  See health maintenance. Colon cancer screening  colonoscopy 04/2017 with 5 y recall  Brother had colon cancer  No blood in stool  Bowel habits- mid morning has loose stool (ever since diet got bad)  Breast cancer screening  mammogram 4/22- had papilloma and lumpectomy with benign path  Self breast exam: no lumps  Flu vaccine October 2022 Covid vaccinated Tetanus vaccine  02/2016 Pneumovax up to date  Zoster vaccine: had shingrix  Dexa  06/2018 osteopenia Falls: has had a few/ does not pick her feet up like she used to  Tripped over a dog at the vet - landed on R elbow . 4 weeks but can move it well  Fractures:  stress fracture in R foot and then developed some arthitis Fx 2nd toe on L foot- she ran into a chair   Supplements: ca and D  Exercise : good /bike    Advance directive: is up to date  Cognitive function addressed- see scanned forms- and if abnormal then additional documentation follows.   Very good memory or cognition   PMH and SH reviewed  Meds, vitals, and allergies reviewed.   ROS: See HPI.  Otherwise negative.    Weight : Wt Readings from Last 3 Encounters:  05/18/21 168 lb 2 oz (76.3 kg)  08/24/20 149 lb (67.6 kg)  05/16/20 162 lb 3 oz (73.6 kg)   31.25 kg/m   Eating not as good  Is getting protein but not fruits and veggies  More tired than usual   Still exercises   Hearing/vision: Hearing Screening - Comments:: Pt wears hearing aids Vision Screening - Comments:: Eye exam in Jan 2023 with Dr. Matilde Sprang    PHQ: Depression screen [facility] 2/9 05/18/2021 05/16/2020 05/11/2018 04/04/2017 03/31/2017  Decreased Interest 0 0 0 0 0  Down, Depressed, Hopeless 0 0 0 0 0  PHQ - 2 Score 0 0 0 0 0  Altered sleeping - - 1 0 -  Tired, decreased energy - - 1 0 -  Change in appetite - - 1 0 -  Feeling bad or failure about yourself  - - 0 0 -  Trouble concentrating - - 0 0 -  Moving  slowly or fidgety/restless - - 0 0 -  Suicidal thoughts - - 0 0 -  PHQ-9 Score - - 3 0 -  Difficult doing work/chores - - Not difficult at all Not difficult at all -     ADLs: no help needed   Functionality: excellent   Care team :  Shakyia Bosso=pcp Byr[facility]-surgery, Sakai= surgery [facility]  HTN bp is stable today  No cp or palpitations or headaches or edema  No side effects to medicines  BP Readings from Last 3 Encounters:  05/18/21 130/82  08/25/20 (!) 146/73  05/16/20 126/78    Pulse Readings from Last 3 Encounters:  05/18/21 82  08/25/20 79  05/16/20 76    Lab Results  Component Value Date   CREATININE 0.73 05/11/2021   BUN 15 05/11/2021   NA 141 05/11/2021   K 4.3 05/11/2021   CL  107 05/11/2021   CO2 29 05/11/2021   Lab Results  Component Value Date   ALT 13 05/11/2021   AST 19 05/11/2021   ALKPHOS 81 05/11/2021   BILITOT 0.3 05/11/2021     Hyperlipidemia  Lab Results  Component Value Date   CHOL 130 05/11/2021   CHOL 129 05/11/2020   CHOL 132 06/24/2019   Lab Results  Component Value Date   HDL 49.10 05/11/2021   HDL 38.90 (L) 05/11/2020   HDL 41.80 06/24/2019   Lab Results  Component Value Date   LDLCALC 60 05/11/2021   LDLCALC 64 05/11/2020   LDLCALC 64 06/24/2019   Lab Results  Component Value Date   TRIG 101.0 05/11/2021   TRIG 129.0 05/11/2020   TRIG 131.0 06/24/2019   Lab Results  Component Value Date   CHOLHDL 3 05/11/2021   CHOLHDL 3 05/11/2020   CHOLHDL 3 06/24/2019   Lab Results  Component Value Date   LDLDIRECT 143.2 08/05/2012   Crestor 10 mg daily   New anemia  Lab Results  Component Value Date   WBC 4.3 05/11/2021   HGB 8.5 Repeated and verified X2. (L) 05/11/2021   HCT 27.3 (L) 05/11/2021   MCV 70.0 (L) 05/11/2021   PLT 287.0 05/11/2021  She tried to give blood last spring and they would not let her  Has not donated in 1 1/2 years ago  No problems with anemia in the past   Hands go to sleep a lot Cramps in her legs in ams (uses mustard)   Patient Active Problem List   Diagnosis Date Noted   Colon cancer screening 05/18/2021   Encounter for screening mammogram for breast cancer 03/31/2017   Hearing loss in right ear 08/30/2016   Anal fissure 03/06/2016   Internal hemorrhoids 02/26/2016   Routine general medical examination at a health care facility 02/03/2015   Estrogen deficiency 02/03/2015   Encounter for Medicare annual wellness exam 08/12/2012   Glaucoma 08/12/2012   History of colon polyps 05/15/2011   Anemia 05/15/2011   ALLERGIC RHINITIS 03/02/2008   Osteopenia 01/05/2007   HYPERCHOLESTEROLEMIA 10/24/2006   Depression with anxiety 10/24/2006   Essential hypertension 10/24/2006   HEMORRHOIDS  10/24/2006   FIBROCYSTIC BREAST DISEASE 10/24/2006   OSTEOARTHRITIS 10/24/2006   Past Medical History:  Diagnosis Date   Allergic rhinitis, cause unspecified    Allergy    Anemia    Anxiety state, unspecified    Cataract    forming    Colon polyps    Depressive disorder, not elsewhere classified    Diffuse cystic mastopathy    Disorder of  bone and cartilage, unspecified    Diverticulosis    External hemorrhoids    GERD (gastroesophageal reflux disease)    Glaucoma    narrow angle thatwas corrected with laser surgery    Osteoarthrosis, unspecified whether generalized or localized, unspecified site    hands   Osteoporosis    Pure hypercholesterolemia    Thyroid disease    years past- no meds currently    Unspecified essential hypertension    Unspecified hemorrhoids without mention of complication    Past Surgical History:  Procedure Laterality Date   BREAST BIOPSY Left 08/11/2020   11:00 3cmfn Q clip u/s bx path pending   BREAST EXCISIONAL BIOPSY Left 08/25/2020   surgical exc papilloma with tag placement tag no.[id number]   BREAST LUMPECTOMY WITH RADIO FREQUENCY LOCALIZER Left 08/25/2020   Procedure: BREAST LUMPECTOMY WITH RADIO FREQUENCY LOCALIZER;  Surgeon: Benjamine Sprague, DO;  Location: [facility] ORS;  Service: General;  Laterality: Left;   BUNIONECTOMY  7/01   left    CESAREAN SECTION  1981   CHOLECYSTECTOMY     COLONOSCOPY  10/03   polyp; hemorrhoids;diverticulosis   COLONOSCOPY  2013   diverticulosis   DEXA  3/02   osteopenia   DEXA  1/05   Stable   HAND SURGERY     DIP fusion-left   HEMORRHOID SURGERY     POLYPECTOMY     TONSILLECTOMY     childhood   Social History   Tobacco Use   Smoking status: Never   Smokeless tobacco: Never  Vaping Use   Vaping Use: Never used  Substance Use Topics   Alcohol use: Yes    Alcohol/week: 0.0 standard drinks    Comment: Occasional-wine   Drug use: No   Family History  Problem Relation Age of Onset   Colon cancer  Brother        ? primary colon   Arthritis Father    Lung cancer Father        + smoker   Stroke Mother    Coronary artery disease Mother 66   Stroke Maternal Grandmother        hemorrhagic   Uterine cancer Maternal Grandmother    Bone cancer Paternal Uncle    Liver cancer Brother    Esophageal cancer Neg Hx    Rectal cancer Neg Hx    Stomach cancer Neg Hx    Breast cancer Neg Hx    Colon polyps Neg Hx    No Known Allergies Current Outpatient Medications on File Prior to Visit  Medication Sig Dispense Refill   amLODipine (NORVASC) 5 MG tablet Take 1 tablet (5 mg total) by mouth daily. 90 tablet 3   Ascorbic Acid (VITAMIN C) 500 MG CAPS Take 500 mg by mouth daily.     Calcium Carbonate-Vit D-Min (CALCIUM 1200) 1200-1000 MG-UNIT CHEW Chew 600 mg by mouth 2 (two) times daily.     FLUoxetine (PROZAC) 20 MG capsule Take 1 capsule (20 mg total) by mouth daily. 90 capsule 3   Melatonin 10 MG TABS Take 10 mg by mouth daily.     Multiple Vitamin (MULTIVITAMIN) tablet Take 1 tablet by mouth daily.     naproxen sodium (ALEVE) 220 MG tablet Take 220 mg by mouth daily.     rosuvastatin (CRESTOR) 10 MG tablet Take 1 tablet (10 mg total) by mouth daily. 90 tablet 3   No current facility-administered medications on file prior to visit.    Review of Systems  Constitutional:  Positive  for fatigue. Negative for activity change, appetite change, fever and unexpected weight change.  HENT:  Negative for congestion, ear pain, rhinorrhea, sinus pressure and sore throat.   Eyes:  Negative for pain, redness and visual disturbance.  Respiratory:  Negative for cough, shortness of breath and wheezing.   Cardiovascular:  Negative for chest pain and palpitations.  Gastrointestinal:  Negative for abdominal pain, blood in stool, constipation and diarrhea.  Endocrine: Negative for polydipsia and polyuria.  Genitourinary:  Negative for dysuria, frequency and urgency.  Musculoskeletal:  Negative for  arthralgias, back pain and myalgias.       Leg cramps  Skin:  Negative for pallor and rash.  Allergic/Immunologic: Negative for environmental allergies.  Neurological:  Negative for dizziness, syncope and headaches.  Hematological:  Negative for adenopathy. Does not bruise/bleed easily.  Psychiatric/Behavioral:  Negative for decreased concentration and dysphoric mood. The patient is not nervous/anxious.       Objective:   Physical Exam Constitutional:      General: She is not in acute distress.    Appearance: Normal appearance. She is well-developed. She is obese. She is not ill-appearing or diaphoretic.  HENT:     Head: Normocephalic and atraumatic.     Right Ear: Tympanic membrane, ear canal and external ear normal.     Left Ear: Tympanic membrane, ear canal and external ear normal.     Nose: Nose normal. No congestion.     Mouth/Throat:     Mouth: Mucous membranes are moist.     Pharynx: Oropharynx is clear. No posterior oropharyngeal erythema.  Eyes:     General: No scleral icterus.    Extraocular Movements: Extraocular movements intact.     Conjunctiva/sclera: Conjunctivae normal.     Pupils: Pupils are equal, round, and reactive to light.  Neck:     Thyroid: No thyromegaly.     Vascular: No carotid bruit or JVD.  Cardiovascular:     Rate and Rhythm: Normal rate and regular rhythm.     Pulses: Normal pulses.     Heart sounds: Normal heart sounds.    No gallop.  Pulmonary:     Effort: Pulmonary effort is normal. No respiratory distress.     Breath sounds: Normal breath sounds. No wheezing.     Comments: Good air exch Chest:     Chest wall: No tenderness.  Abdominal:     General: Abdomen is protuberant. Bowel sounds are normal. There is no distension or abdominal bruit.     Palpations: Abdomen is soft. There is no hepatomegaly, splenomegaly or mass.     Tenderness: There is no abdominal tenderness. There is no guarding or rebound.     Hernia: No hernia is present.   Genitourinary:    Comments: Breast exam: No mass, nodules, thickening, tenderness, bulging, retraction, inflamation, nipple discharge or skin changes noted.  No axillary or clavicular LA.     Musculoskeletal:        General: No tenderness. Normal range of motion.     Cervical back: Normal range of motion and neck supple. No rigidity. No muscular tenderness.     Right lower leg: No edema.     Left lower leg: No edema.     Comments: No kyphosis   Lymphadenopathy:     Cervical: No cervical adenopathy.  Skin:    General: Skin is warm and dry.     Coloration: Skin is not pale.     Findings: No erythema or rash.  Comments: Solar lentigines diffusely Some sks  Fair complexion   Neurological:     Mental Status: She is alert. Mental status is at baseline.     Cranial Nerves: No cranial nerve deficit.     Motor: No abnormal muscle tone.     Coordination: Coordination normal.     Gait: Gait normal.     Deep Tendon Reflexes: Reflexes are normal and symmetric. Reflexes normal.  Psychiatric:        Mood and Affect: Mood normal.        Cognition and Memory: Cognition and memory normal.          Assessment & Plan:   Problem List Items Addressed This Visit       Cardiovascular and Mediastinum   Essential hypertension    bp in fair control at this time  BP Readings from Last 1 Encounters:  05/18/21 130/82  No changes needed Most recent labs reviewed  Disc lifstyle change with low sodium diet and exercise  Plan to continue amlodipine 5 mg daily        Musculoskeletal and Integument   Osteopenia    dexa 2020 reviewed  Due for recall/ordered and pt will schedule  Noted a trip and fall/ fall prevention discussed Noted stress fracture in right foot and traumatic fracture of toe  Taking ca and D goog exercise habits Pending dexam may consider treatment         Other   Anemia    New anemia with hb of 8.5 Some fatigue and some leg cramps Cbc with iron studies and smear  review ordered today  Also ifob kit No recent GI complaints  Plan to follow- may need further w/u with endoscopy and/or hematology      Relevant Orders   Ferritin (Completed)   Iron (Completed)   CBC with Differential/Platelet (Completed)   Pathologist smear review   Colon cancer screening    Colonoscopy utd 2019 with 5 y recall  ifob kit ordred today  She has new anemia       Relevant Orders   Fecal occult blood, imunochemical([facility])   Depression with anxiety    PHQ score of 0 today  Doing well      Encounter for Medicare annual wellness exam - Primary    Reviewed health habits including diet and exercise and skin cancer prevention Reviewed appropriate screening tests for age  Also reviewed health mt list, fam hx and immunization status , as well as social and family history   See HPI Labs reviewed  Colonoscopy utd and ifob kit given in light of anemia  Mammogram utd with recent b9 bx Vaccines up to date dexa ordered  Fall prevention discussed  Taking ca and D and exercising  Adv directive is utd No cognitive concerns Hearing aides noted and utd with vision/eye care  PHQ score of 0 No help needed wth ADLs/normal functionality       Estrogen deficiency   Relevant Orders   DG Bone Density   HYPERCHOLESTEROLEMIA    Disc goals for lipids and reasons to control them Rev last labs with pt Rev low sat fat diet in detail LDL in good control at 62 Plan to continue crestor 10 mg daily       Routine general medical examination at a health care facility    Reviewed health habits including diet and exercise and skin cancer prevention Reviewed appropriate screening tests for age  Also reviewed health mt list, fam hx and immunization  status , as well as social and family history   See HPI Labs reviewed  Colonoscopy utd and ifob kit given in light of anemia  Mammogram utd with recent b9 bx Vaccines up to date dexa ordered  Fall prevention discussed  Taking  ca and D and exercising  Adv directive is utd No cognitive concerns Hearing aides noted and utd with vision/eye care  PHQ score of 0 No help needed wth ADLs/normal functionality

## 2021-05-18 NOTE — Patient Instructions (Addendum)
Lab today for anemia  Also stool kit   We will make a plan from there   Take care of yourself  Try and get some more fruit and veggies      Call and schedule your bone density test at [facility]  Please call the location of your choice from the menu below to schedule your Mammogram and/or Bone Density appointment.    [facility]                      Phone:  [phone] [address], [place] [address]                                                             Services: Traditional and 3D Mammogram, [place] Bone Density                 Phone: [phone] [address], [place] [address]    Service: Bone Density ONLY   *this site does NOT perform mammograms  [place]                        Phone:  [phone] [address], [place] [address]                                            Services:  3D Mammogram and [place] at [facility]   Phone:  [phone]   [place], [place] [address]                                            Services: 3D Mammogram and [place]  [place] at [facility])  Phone:  [phone]  [address] [place], [place] [address]                                              Services:  3D Mammogram and Bone Density

## 2021-05-20 NOTE — Assessment & Plan Note (Signed)
bp in fair control at this time  BP Readings from Last 1 Encounters:  05/18/21 130/82   No changes needed Most recent labs reviewed  Disc lifstyle change with low sodium diet and exercise  Plan to continue amlodipine 5 mg daily

## 2021-05-20 NOTE — Assessment & Plan Note (Signed)
Disc goals for lipids and reasons to control them Rev last labs with pt Rev low sat fat diet in detail LDL in good control at 62 Plan to continue crestor 10 mg daily

## 2021-05-20 NOTE — Assessment & Plan Note (Signed)
dexa 2020 reviewed  Due for recall/ordered and pt will schedule  Noted a trip and fall/ fall prevention discussed Noted stress fracture in right foot and traumatic fracture of toe  Taking ca and D goog exercise habits Pending dexam may consider treatment

## 2021-05-20 NOTE — Assessment & Plan Note (Signed)
Reviewed health habits including diet and exercise and skin cancer prevention Reviewed appropriate screening tests for age  Also reviewed health mt list, fam hx and immunization status , as well as social and family history   See HPI Labs reviewed  Colonoscopy utd and ifob kit given in light of anemia  Mammogram utd with recent b9 bx Vaccines up to date dexa ordered  Fall prevention discussed  Taking ca and D and exercising  Adv directive is utd No cognitive concerns Hearing aides noted and utd with vision/eye care  PHQ score of 0 No help needed wth ADLs/normal functionality

## 2021-05-20 NOTE — Assessment & Plan Note (Signed)
PHQ score of 0 today  Doing well

## 2021-05-20 NOTE — Assessment & Plan Note (Signed)
New anemia with hb of 8.5 Some fatigue and some leg cramps Cbc with iron studies and smear review ordered today  Also ifob kit No recent GI complaints  Plan to follow- may need further w/u with endoscopy and/or hematology

## 2021-05-20 NOTE — Assessment & Plan Note (Signed)
Colonoscopy utd 2019 with 5 y recall  ifob kit ordred today  She has new anemia

## 2021-05-21 LAB — CBC WITH DIFFERENTIAL/PLATELET
Absolute Monocytes: 791 cells/uL (ref 200–950)
Basophils Absolute: 31 cells/uL (ref 0–200)
Basophils Relative: 0.6 %
Eosinophils Absolute: 31 cells/uL (ref 15–500)
Eosinophils Relative: 0.6 %
HCT: 30.2 % — ABNORMAL LOW (ref 35.0–45.0)
Hemoglobin: 8.6 g/dL — ABNORMAL LOW (ref 11.7–15.5)
Lymphs Abs: 1831 cells/uL (ref 850–3900)
MCH: 21.2 pg — ABNORMAL LOW (ref 27.0–33.0)
MCHC: 28.5 g/dL — ABNORMAL LOW (ref 32.0–36.0)
MCV: 74.4 fL — ABNORMAL LOW (ref 80.0–100.0)
MPV: 10.3 fL (ref 7.5–12.5)
Monocytes Relative: 15.5 %
Neutro Abs: 2417 cells/uL (ref 1500–7800)
Neutrophils Relative %: 47.4 %
Platelets: 345 10*3/uL (ref 140–400)
RBC: 4.06 10*6/uL (ref 3.80–5.10)
RDW: 16.8 % — ABNORMAL HIGH (ref 11.0–15.0)
Total Lymphocyte: 35.9 %
WBC: 5.1 10*3/uL (ref 3.8–10.8)

## 2021-05-21 LAB — PATHOLOGIST SMEAR REVIEW

## 2021-05-22 ENCOUNTER — Other Ambulatory Visit (INDEPENDENT_AMBULATORY_CARE_PROVIDER_SITE_OTHER): Payer: Medicare PPO

## 2021-05-22 DIAGNOSIS — D509 Iron deficiency anemia, unspecified: Secondary | ICD-10-CM

## 2021-05-22 DIAGNOSIS — Z1211 Encounter for screening for malignant neoplasm of colon: Secondary | ICD-10-CM

## 2021-05-22 LAB — FECAL OCCULT BLOOD, IMMUNOCHEMICAL: Fecal Occult Bld: NEGATIVE

## 2021-05-22 MED ORDER — POLYSACCHARIDE IRON COMPLEX 150 MG PO CAPS: 150.0000 mg | ORAL_CAPSULE | Freq: Two times a day (BID) | ORAL | 2 refills | Status: DC

## 2021-05-22 NOTE — Progress Notes (Signed)
I sent the iron to pharmacy to take bid  Can take it with food  Stool softener if needed   I placed the referral for [place]  Urgent referral-unsure if this will make a difference - if someone would please let me know when she gets her appt this would be helpful   Miquel Dunn is currently out as well

## 2021-05-23 ENCOUNTER — Telehealth: Payer: Self-pay | Admitting: Internal Medicine

## 2021-05-23 NOTE — Telephone Encounter (Signed)
When I bring up ferrex it just shows up as the same thing I prescribed  This is confusing  I think it is also otc?   She could also get ferrous sulfate 325 mg bid over the counter if that is affordable   Let me know if there is an affordable px equivalent and if not then could get either otc

## 2021-05-23 NOTE — Addendum Note (Signed)
Addended by: Loura Pardon A on: 05/23/2021 06:50 PM   Modules accepted: Orders

## 2021-05-23 NOTE — Telephone Encounter (Signed)
Call from [facility] after hours that iron rx is expensive for patient requesting change to ferrex. Will forward to PCP for appropriateness.

## 2021-05-24 NOTE — Telephone Encounter (Signed)
That was taken care of today

## 2021-05-24 NOTE — Telephone Encounter (Signed)
See notes below access nurse note.

## 2021-05-24 NOTE — Telephone Encounter (Signed)
[place] Night - Client TELEPHONE ADVICE RECORD AccessNurse Patient Name: Sierra Sparks Gender: Female DOB: 05-31-1940 Age: 81 Y 34 M 12 D Return Phone Number: [phone] (Primary) Address: City/ State/ Zip: [place]  [address] Client [place] Night - Client Client Site [place] Provider [place], Roque Lias - MD Contact Type Call Who Is Calling Pharmacy Call Type Pharmacy Send to RN Chief Complaint Prescription Refill or Medication Request (non symptomatic) Reason for Call Request to change medication order Initial Comment Caller states he is from [place] and needs a RX changed because of the cost. Pharmacy Name [facility] Pharmacist Name Chance Number [phone] Translation No Nurse Assessment Nurse: Linward Headland, RN, [place] Date/Time ([place] Time): 05/23/2021 4:18:12 PM Please select the assessment type ---Pharmacy clarification Is there an on-call physician for the client? ---Yes Do the client directives allow paging the on call for medication concerns? ---Yes Document information that requires clarification. ---Pharmacist states patient was rx'ed nephroex 150 mg capsules, needs switched to ferrex due to high cost for patient. Disp. Time Eilene Ghazi Time) Disposition Final User 05/23/2021 4:34:02 PM Send To RN Personal Linward Headland, RN, [place] 05/23/2021 5:19:47 PM Called On-Call Provider [place], RN, [place] 05/23/2021 5:20:34 PM Clinical Call Yes Linward Headland, RN, [facility] Phone DateTime Result/ Outcome Message Type Notes [place] [phone] 05/23/2021 5:19:47 PM Called On Call Provider - Reached Doctor Paged PLEASE NOTE: All timestamps contained within this report are represented as [place] Standard Time. CONFIDENTIALTY NOTICE: This fax transmission is intended only for the addressee. It contains information that is legally privileged, confidential or otherwise protected  from use or disclosure. If you are not the intended recipient, you are strictly prohibited from reviewing, disclosing, copying using or disseminating any of this information or taking any action in reliance on or regarding this information. If you have received this fax in error, please notify us immediately by telephone so that we can arrange for its return to [place]. Phone: [phone], Toll-Free: [phone], Fax: [phone] Page: 2 of 2 Call Id: [id number] Paging DoctorName Phone DateTime Result/ Outcome Message Type Notes Queen Slough 05/23/2021 5:20:05 PM Spoke with On Call - General Message Result Provider will be relaying message to ordering physician to be handled tomorrow

## 2021-05-24 NOTE — Telephone Encounter (Signed)
Spoke to pharmacy and they just needed a verbal ok to change Rx to the ferrex with same strength, quantity and directions. Verbal okay given and they advise me the ferrex is $8, they will get it ready and advise pt when it's ready for pick up.  FYI to PCP

## 2021-05-30 ENCOUNTER — Other Ambulatory Visit: Payer: Self-pay

## 2021-05-30 ENCOUNTER — Other Ambulatory Visit (INDEPENDENT_AMBULATORY_CARE_PROVIDER_SITE_OTHER): Payer: Medicare PPO

## 2021-05-30 DIAGNOSIS — D509 Iron deficiency anemia, unspecified: Secondary | ICD-10-CM

## 2021-05-30 LAB — CBC WITH DIFFERENTIAL/PLATELET
Basophils Absolute: 0 10*3/uL (ref 0.0–0.1)
Basophils Relative: 0.6 % (ref 0.0–3.0)
Eosinophils Absolute: 0 10*3/uL (ref 0.0–0.7)
Eosinophils Relative: 0.8 % (ref 0.0–5.0)
HCT: 28.5 % — ABNORMAL LOW (ref 36.0–46.0)
Hemoglobin: 8.5 g/dL — ABNORMAL LOW (ref 12.0–15.0)
Lymphocytes Relative: 31.8 % (ref 12.0–46.0)
Lymphs Abs: 1.5 10*3/uL (ref 0.7–4.0)
MCHC: 29.9 g/dL — ABNORMAL LOW (ref 30.0–36.0)
MCV: 69.8 fl — ABNORMAL LOW (ref 78.0–100.0)
Monocytes Absolute: 0.6 10*3/uL (ref 0.1–1.0)
Monocytes Relative: 12.9 % — ABNORMAL HIGH (ref 3.0–12.0)
Neutro Abs: 2.6 10*3/uL (ref 1.4–7.7)
Neutrophils Relative %: 53.9 % (ref 43.0–77.0)
Platelets: 327 10*3/uL (ref 150.0–400.0)
RBC: 4.08 Mil/uL (ref 3.87–5.11)
RDW: 19.2 % — ABNORMAL HIGH (ref 11.5–15.5)
WBC: 4.8 10*3/uL (ref 4.0–10.5)

## 2021-05-30 LAB — FERRITIN: Ferritin: 8.8 ng/mL — ABNORMAL LOW (ref 10.0–291.0)

## 2021-05-31 LAB — IRON: Iron: 31 ug/dL — ABNORMAL LOW (ref 42–145)

## 2021-06-05 ENCOUNTER — Encounter: Payer: Self-pay | Admitting: Nurse Practitioner

## 2021-06-14 ENCOUNTER — Other Ambulatory Visit: Payer: Self-pay

## 2021-06-14 ENCOUNTER — Encounter: Payer: Self-pay | Admitting: Nurse Practitioner

## 2021-06-14 ENCOUNTER — Ambulatory Visit
Admission: EM | Admit: 2021-06-14 | Discharge: 2021-06-14 | Disposition: A | Discharge status: Home / Self Care | Payer: Medicare PPO

## 2021-06-14 ENCOUNTER — Encounter: Payer: Self-pay | Admitting: Emergency Medicine

## 2021-06-14 ENCOUNTER — Other Ambulatory Visit (INDEPENDENT_AMBULATORY_CARE_PROVIDER_SITE_OTHER): Payer: Medicare PPO

## 2021-06-14 ENCOUNTER — Ambulatory Visit: Payer: Medicare PPO | Admitting: Nurse Practitioner

## 2021-06-14 ENCOUNTER — Telehealth: Payer: Self-pay | Admitting: Family Medicine

## 2021-06-14 VITALS — BP 148/90 | HR 64 | Ht 61.5 in | Wt 172.5 lb

## 2021-06-14 DIAGNOSIS — I499 Cardiac arrhythmia, unspecified: Secondary | ICD-10-CM | POA: Diagnosis not present

## 2021-06-14 DIAGNOSIS — Z86010 Personal history of colonic polyps: Secondary | ICD-10-CM

## 2021-06-14 DIAGNOSIS — R197 Diarrhea, unspecified: Secondary | ICD-10-CM

## 2021-06-14 DIAGNOSIS — D509 Iron deficiency anemia, unspecified: Secondary | ICD-10-CM

## 2021-06-14 LAB — COMPREHENSIVE METABOLIC PANEL
ALT: 15 U/L (ref 0–35)
AST: 20 U/L (ref 0–37)
Albumin: 4.2 g/dL (ref 3.5–5.2)
Alkaline Phosphatase: 85 U/L (ref 39–117)
BUN: 20 mg/dL (ref 6–23)
CO2: 28 mEq/L (ref 19–32)
Calcium: 9.5 mg/dL (ref 8.4–10.5)
Chloride: 104 mEq/L (ref 96–112)
Creatinine, Ser: 0.68 mg/dL (ref 0.40–1.20)
GFR: 83.36 mL/min (ref >60.00)
Glucose, Bld: 110 mg/dL — ABNORMAL HIGH (ref 70–99)
Potassium: 4.1 mEq/L (ref 3.5–5.1)
Sodium: 138 mEq/L (ref 135–145)
Total Bilirubin: 0.3 mg/dL (ref 0.2–1.2)
Total Protein: 7.1 g/dL (ref 6.0–8.3)

## 2021-06-14 LAB — CBC WITH DIFFERENTIAL/PLATELET
Basophils Absolute: 0 10*3/uL (ref 0.0–0.1)
Basophils Relative: 0.7 % (ref 0.0–3.0)
Eosinophils Absolute: 0 10*3/uL (ref 0.0–0.7)
Eosinophils Relative: 0.8 % (ref 0.0–5.0)
HCT: 29.4 % — ABNORMAL LOW (ref 36.0–46.0)
Hemoglobin: 9.1 g/dL — ABNORMAL LOW (ref 12.0–15.0)
Lymphocytes Relative: 39.1 % (ref 12.0–46.0)
Lymphs Abs: 2.1 10*3/uL (ref 0.7–4.0)
MCHC: 30.9 g/dL (ref 30.0–36.0)
MCV: 70.7 fl — ABNORMAL LOW (ref 78.0–100.0)
Monocytes Absolute: 0.7 10*3/uL (ref 0.1–1.0)
Monocytes Relative: 13.3 % — ABNORMAL HIGH (ref 3.0–12.0)
Neutro Abs: 2.5 10*3/uL (ref 1.4–7.7)
Neutrophils Relative %: 46.1 % (ref 43.0–77.0)
Platelets: 302 10*3/uL (ref 150.0–400.0)
RBC: 4.17 Mil/uL (ref 3.87–5.11)
RDW: 20.5 % — ABNORMAL HIGH (ref 11.5–15.5)
WBC: 5.4 10*3/uL (ref 4.0–10.5)

## 2021-06-14 LAB — C-REACTIVE PROTEIN: CRP: <1 mg/dL (ref 0.5–20.0)

## 2021-06-14 LAB — MAGNESIUM: Magnesium: 2 mg/dL (ref 1.5–2.5)

## 2021-06-14 NOTE — Progress Notes (Signed)
Assessment and plan reviewed.  Agree with plans including cardiac clearance prior to scheduling endoscopic evaluations. ?

## 2021-06-14 NOTE — ED Provider Notes (Signed)
Roderic Palau    CSN: [id number] Arrival date & time: 06/14/21  1354      History   Chief Complaint Chief Complaint  Patient presents with   Possible Arrhythmia    HPI Sierra Sparks is a 81 y.o. female presenting for EKG.  States that she was told by her gastroenterologist that she has an abnormal pulse, they did not have availability for EKG at [facility] and so they sent her here.  She is feeling well, denies chest pain, shortness of breath, dizziness, weakness.  She does note that she is being evaluated for iron deficiency anemia by her primary care provider, they have started her on an iron supplement and are monitoring her labs there.  HPI  Past Medical History:  Diagnosis Date   Allergic rhinitis, cause unspecified    Allergy    Anemia    Anxiety state, unspecified    Arthritis    Cataract    forming    Colon polyps    Depressive disorder, not elsewhere classified    Diffuse cystic mastopathy    Disorder of bone and cartilage, unspecified    Diverticulosis    External hemorrhoids    Gallstones    GERD (gastroesophageal reflux disease)    Glaucoma    narrow angle thatwas corrected with laser surgery    Osteoarthrosis, unspecified whether generalized or localized, unspecified site    hands   Osteoporosis    Pure hypercholesterolemia    Thyroid disease    years past- no meds currently    Unspecified essential hypertension    Unspecified hemorrhoids without mention of complication     Patient Active Problem List   Diagnosis Date Noted   Colon cancer screening 05/18/2021   Encounter for screening mammogram for breast cancer 03/31/2017   Hearing loss in right ear 08/30/2016   Anal fissure 03/06/2016   Internal hemorrhoids 02/26/2016   Routine general medical examination at a health care facility 02/03/2015   Estrogen deficiency 02/03/2015   Encounter for Medicare annual wellness exam 08/12/2012   Glaucoma 08/12/2012   History of colon polyps  05/15/2011   Anemia 05/15/2011   ALLERGIC RHINITIS 03/02/2008   Osteopenia 01/05/2007   HYPERCHOLESTEROLEMIA 10/24/2006   Depression with anxiety 10/24/2006   Essential hypertension 10/24/2006   HEMORRHOIDS 10/24/2006   FIBROCYSTIC BREAST DISEASE 10/24/2006   OSTEOARTHRITIS 10/24/2006    Past Surgical History:  Procedure Laterality Date   BREAST BIOPSY Left 08/11/2020   11:00 3cmfn Q clip u/s bx path pending   BREAST EXCISIONAL BIOPSY Left 08/25/2020   surgical exc papilloma with tag placement tag no.[id number]   BREAST LUMPECTOMY WITH RADIO FREQUENCY LOCALIZER Left 08/25/2020   Procedure: BREAST LUMPECTOMY WITH RADIO FREQUENCY LOCALIZER;  Surgeon: Benjamine Sprague, DO;  Location: [facility] ORS;  Service: General;  Laterality: Left;   BUNIONECTOMY Left 10/1999   CESAREAN SECTION  1981   CHOLECYSTECTOMY     COLONOSCOPY  01/2002   polyp; hemorrhoids;diverticulosis   COLONOSCOPY  2013   diverticulosis   DEXA  06/2000   osteopenia   DEXA  04/2003   Stable   HAND SURGERY Left    DIP fusion-left   HEMORRHOID SURGERY     POLYPECTOMY     TONSILLECTOMY     childhood    OB History   No obstetric history on file.      Home Medications    Prior to Admission medications   Medication Sig Start Date End Date Taking? Authorizing Provider  amLODipine (  NORVASC) 5 MG tablet Take 1 tablet (5 mg total) by mouth daily. 05/16/20   Tower, Wynelle Fanny, MD  Ascorbic Acid (VITAMIN C) 500 MG CAPS Take 500 mg by mouth daily.    [provider]  Calcium Carbonate-Vit D-Min (CALCIUM 1200) 1200-1000 MG-UNIT CHEW Chew 600 mg by mouth 2 (two) times daily.    [provider]  FLUoxetine (PROZAC) 20 MG capsule Take 1 capsule (20 mg total) by mouth daily. 05/16/20   Tower, Wynelle Fanny, MD  iron polysaccharides (NIFEREX) 150 MG capsule Take 1 capsule (150 mg total) by mouth 2 (two) times daily. 05/22/21   Tower, Wynelle Fanny, MD  Melatonin 10 MG TABS Take 10 mg by mouth daily.    [provider]  Multiple  Vitamin (MULTIVITAMIN) tablet Take 1 tablet by mouth daily.    [provider]  naproxen sodium (ALEVE) 220 MG tablet Take 220 mg by mouth daily.    [provider]  rosuvastatin (CRESTOR) 10 MG tablet Take 1 tablet (10 mg total) by mouth daily. 05/16/20   Tower, Wynelle Fanny, MD    Family History Family History  Problem Relation Age of Onset   Stroke Mother    Coronary artery disease Mother 4   Hypertension Mother    Hyperlipidemia Mother    Arthritis Father    Lung cancer Father        + smoker   Hypertension Sister    Migraines Sister    Colon cancer Brother        ? primary colon   Liver cancer Brother    Cancer Brother        type unknown   Stroke Maternal Grandmother        hemorrhagic   Cancer Maternal Grandmother        female cancer type unknown   Bone cancer Paternal Uncle    Esophageal cancer Neg Hx    Rectal cancer Neg Hx    Stomach cancer Neg Hx    Breast cancer Neg Hx    Colon polyps Neg Hx     Social History Social History   Tobacco Use   Smoking status: Never   Smokeless tobacco: Never  Vaping Use   Vaping Use: Never used  Substance Use Topics   Alcohol use: Yes    Comment: Occasional-wine, 3 per week   Drug use: No     Allergies   Patient has no known allergies.   Review of Systems Review of Systems  Respiratory:  Negative for shortness of breath.   Cardiovascular:  Negative for chest pain.  All other systems reviewed and are negative.   Physical Exam Triage Vital Signs ED Triage Vitals  Enc Vitals Group     BP 06/14/21 1428 (!) 157/71     Pulse Rate 06/14/21 1428 82     Resp 06/14/21 1428 (!) 22     Temp 06/14/21 1428 98.2 F (36.8 C)     Temp src --      SpO2 06/14/21 1428 96 %     Weight --      Height --      Head Circumference --      Peak Flow --      Pain Score 06/14/21 1431 0     Pain Loc --      Pain Edu? --      Excl. in [place]? --    No data found.  Updated Vital Signs BP (!) 157/71  Pulse 82     Temp 98.2 F (36.8 C)    Resp (!) 22    SpO2 96%   Visual Acuity Right Eye Distance:   Left Eye Distance:   Bilateral Distance:    Right Eye Near:   Left Eye Near:    Bilateral Near:     Physical Exam Vitals reviewed.  Constitutional:      Appearance: Normal appearance. She is not diaphoretic.  HENT:     Head: Normocephalic and atraumatic.     Mouth/Throat:     Mouth: Mucous membranes are moist.  Eyes:     Extraocular Movements: Extraocular movements intact.     Pupils: Pupils are equal, round, and reactive to light.  Cardiovascular:     Rate and Rhythm: Normal rate and regular rhythm.     Pulses:          Radial pulses are 2+ on the right side and 2+ on the left side.     Heart sounds: Normal heart sounds.     Comments: Radial pulses are irregularly regular. Every 5th beat is slightly delayed.  Pulmonary:     Effort: Pulmonary effort is normal.     Breath sounds: Normal breath sounds.  Abdominal:     Palpations: Abdomen is soft.     Tenderness: There is no abdominal tenderness. There is no guarding or rebound.  Musculoskeletal:     Right lower leg: No edema.     Left lower leg: No edema.  Skin:    General: Skin is warm.     Capillary Refill: Capillary refill takes less than 2 seconds.  Neurological:     General: No focal deficit present.     Mental Status: She is alert and oriented to person, place, and time.  Psychiatric:        Mood and Affect: Mood normal.        Behavior: Behavior normal.        Thought Content: Thought content normal.        Judgment: Judgment normal.     UC Treatments / Results  Labs (all labs ordered are listed, but only abnormal results are displayed) Labs Reviewed - No data to display  EKG   Radiology No results found.  Procedures Procedures (including critical care time)  Medications Ordered in UC Medications - No data to display  Initial Impression / Assessment and Plan / UC Course  I have reviewed the triage vital  signs and the nursing notes.  Pertinent labs & imaging results that were available during my care of the patient were reviewed by me and considered in my medical decision making (see chart for details).     This patient is a very pleasant 81 y.o. year old female presenting with irregularly regular pulse; sent by [facility] as they could not do an EKG. On exam, radial pulses are indeed irregularly  regular. EKG with few PACs, unchanged from 08/2020 EKG. She is without CP, SOB, dizziness. Reassurance provided. Provided pt with a copy of EKG results. F/u with GI and PCP for further management..   Final Clinical Impressions(s) / UC Diagnoses   Final diagnoses:  Regularly irregular pulse rhythym     Discharge Instructions      -Your EKG looks unchanged from the EKG 5/22.  This is a good thing. -If you develop new symptoms like chest pain, shortness of breath, dizziness, weakness-head to the emergency department.   ED Prescriptions   None  PDMP not reviewed this encounter.   Hazel Sams, PA-C 06/14/21 [phone]

## 2021-06-14 NOTE — Progress Notes (Signed)
06/14/2021 DEMEISHA GERAGHTY [id number] 1940-10-06   CHIEF COMPLAINT: Iron deficiency anemia  HISTORY OF PRESENT ILLNESS:  Arelie Kuzel. Anagnos is a 81 year old female with a past medical history of anxiety, depression, hypertension, osteoporosis, glaucoma and colon polyps.  She presents to our office today as referred by Dr. Loura Pardon for further evaluation regarding iron deficiency anemia.  She underwent routine laboratory studies 05/11/2021 which showed a hemoglobin level of 8.5 (baseline hemoglobin level 12.5 ), hematocrit 27.3 and MCV 70.0.  Normal renal function with a BUN 15, creatinine 0.73 and GFR 78.77.  Pathologist smear review showed a few lymphocytes which appeared reactive, no immature cells identified and platelets were clumped and RBCs appeared to be microcytic and hypochromic suggestive of iron deficiency.  FOBT was negative on 05/22/2021.  Iron 43 and ferritin 8 on 05/18/2021.  Repeat iron 31 on 05/30/2021.  She complains of having fatigue for the past 4 months.  No chest pain or shortness of breath.  No dysphagia or heartburn.  No upper or lower abdominal pain.  She reports passing nonbloody explosive diarrhea once daily for the past 3 months.  No antibiotics or new medication within the past 6 months except for Niferex 150 mg twice daily which she started approximately 4 weeks ago.  Prior to the onset of diarrhea, she was passing a normal formed brown bowel movement daily.  No rectal bleeding or black stools.  She takes Aleve 1 tab daily for arthritis pain.  Remote history of GERD.  She denies ever having an EGD or history of ulcer/GI bleed.  Her most recent colonoscopy was 05/07/2017 which showed diverticulosis in the sigmoid and rectum, internal hemorrhoids and no polyps.  No further colonoscopies were recommended due to age at that point.  Brother was diagnosed with colon cancer in his early 51s.  No known family history of celiac disease or IBD.  She denies having any weight loss, in  fact she has gained weight.  No fever, sweats or chills.  No other complaints at this time.  CBC Latest Ref Rng & Units 05/30/2021 05/18/2021 05/11/2021  WBC 4.0 - 10.5 K/uL 4.8 5.1 4.3  Hemoglobin 12.0 - 15.0 g/dL 8.5(L) 8.6(L) 8.5 Repeated and verified X2.(L)  Hematocrit 36.0 - 46.0 % 28.5(L) 30.2(L) 27.3(L)  Platelets 150.0 - 400.0 K/uL 327.0 345 287.0  MCV 69.8 Iron was 43 and Ferritin 8 on 05/18/2021 Iron 31 on 05/30/2021 TSH 2.07 on 05/11/2021  CMP Latest Ref Rng & Units 05/11/2021 05/11/2020 06/24/2019  Glucose 70 - 99 mg/dL 102(H) 98 -  BUN 6 - 23 mg/dL 15 22 -  Creatinine 0.40 - 1.20 mg/dL 0.73 0.75 -  Sodium 135 - 145 mEq/L 141 140 -  Potassium 3.5 - 5.1 mEq/L 4.3 4.5 -  Chloride 96 - 112 mEq/L 107 105 -  CO2 19 - 32 mEq/L 29 30 -  Calcium 8.4 - 10.5 mg/dL 9.2 9.8 -  Total Protein 6.0 - 8.3 g/dL 6.6 6.8 -  Total Bilirubin 0.2 - 1.2 mg/dL 0.3 0.5 -  Alkaline Phos 39 - 117 U/L 81 89 -  AST 0 - 37 U/L 19 20 19   ALT 0 - 35 U/L 13 14 13     Colonoscopy by Dr. Henrene Pastor 05/07/2017: - Diverticulosis in the rectum and in the sigmoid colon. - Internal hemorrhoids. - The examination was otherwise normal on direct and retroflexion views. - No specimens collected. -No further colon polyp surveillance colonoscopies recommended due to age  Colonoscopy 07/11/2011: Scattered diverticulosis No polyps  Colonoscopy 05/10/2005: Diverticulosis No polyp  Colonoscopy 02/10/2002: 1 mm adenomatous polyp removed from the descending colon Sigmoid diverticulosis External hemorrhoid  Past Medical History:  Diagnosis Date   Allergic rhinitis, cause unspecified    Allergy    Anemia    Anxiety state, unspecified    Cataract    forming    Colon polyps    Depressive disorder, not elsewhere classified    Diffuse cystic mastopathy    Disorder of bone and cartilage, unspecified    Diverticulosis    External hemorrhoids    GERD (gastroesophageal reflux disease)    Glaucoma    narrow angle thatwas  corrected with laser surgery    Osteoarthrosis, unspecified whether generalized or localized, unspecified site    hands   Osteoporosis    Pure hypercholesterolemia    Thyroid disease    years past- no meds currently    Unspecified essential hypertension    Unspecified hemorrhoids without mention of complication    Past Surgical History:  Procedure Laterality Date   BREAST BIOPSY Left 08/11/2020   11:00 3cmfn Q clip u/s bx path pending   BREAST EXCISIONAL BIOPSY Left 08/25/2020   surgical exc papilloma with tag placement tag no.[id number]   BREAST LUMPECTOMY WITH RADIO FREQUENCY LOCALIZER Left 08/25/2020   Procedure: BREAST LUMPECTOMY WITH RADIO FREQUENCY LOCALIZER;  Surgeon: Benjamine Sprague, DO;  Location: [facility] ORS;  Service: General;  Laterality: Left;   BUNIONECTOMY  7/01   left    CESAREAN SECTION  1981   CHOLECYSTECTOMY     COLONOSCOPY  10/03   polyp; hemorrhoids;diverticulosis   COLONOSCOPY  2013   diverticulosis   DEXA  3/02   osteopenia   DEXA  1/05   Stable   HAND SURGERY     DIP fusion-left   HEMORRHOID SURGERY     POLYPECTOMY     TONSILLECTOMY     childhood   Social History: She is married.  She is a retired Radio producer.  She has 2 sons.  Nonsmoker. She drinks one rum and coke 4 days weekly.  No drug use  Family History: family history includes Arthritis in her father; Bone cancer in her paternal uncle; Colon cancer in her brother; Coronary artery disease (age of onset: 25) in her mother; Liver cancer in her brother; Lung cancer in her father; Stroke in her maternal grandmother and mother; Uterine cancer in her maternal grandmother.  No Known Allergies   Outpatient Encounter Medications as of 06/14/2021  Medication Sig   amLODipine (NORVASC) 5 MG tablet Take 1 tablet (5 mg total) by mouth daily.   Ascorbic Acid (VITAMIN C) 500 MG CAPS Take 500 mg by mouth daily.   Calcium Carbonate-Vit D-Min (CALCIUM 1200) 1200-1000 MG-UNIT CHEW Chew 600 mg by mouth 2 (two) times  daily.   FLUoxetine (PROZAC) 20 MG capsule Take 1 capsule (20 mg total) by mouth daily.   iron polysaccharides (NIFEREX) 150 MG capsule Take 1 capsule (150 mg total) by mouth 2 (two) times daily.   Melatonin 10 MG TABS Take 10 mg by mouth daily.   Multiple Vitamin (MULTIVITAMIN) tablet Take 1 tablet by mouth daily.   naproxen sodium (ALEVE) 220 MG tablet Take 220 mg by mouth daily.   rosuvastatin (CRESTOR) 10 MG tablet Take 1 tablet (10 mg total) by mouth daily.   No facility-administered encounter medications on file as of 06/14/2021.    REVIEW OF SYSTEMS: Gen: + Fatigue. Denies fever, sweats or chills.  No weight loss.  CV: Denies chest pain, palpitations or edema. Resp: Denies cough, shortness of breath of hemoptysis.  GI: See HPI.   GU : Denies urinary burning, blood in urine, increased urinary frequency or incontinence. MS: + Arthritis, muscle cramps.  Derm: Denies rash, itchiness, skin lesions or unhealing ulcers. Psych: Denies depression, anxiety, memory loss, suicidal ideation and confusion. Heme: Denies bruising, easy bleeding. Neuro:  Denies headaches, dizziness or paresthesias. Endo:  Denies any problems with DM, thyroid or adrenal function.  PHYSICAL EXAM: BP (!) 148/90 (BP Location: Left Arm, Patient Position: Sitting, Cuff Size: Normal)    Pulse 64 Comment: irregular   Ht 5' 1.5" (1.562 m) Comment: height measured without shoes   Wt 172 lb 8 oz (78.2 kg)    BMI 32.07 kg/m   General: 81 year old female in no acute distress. Head: Normocephalic and atraumatic. Eyes:  Sclerae non-icteric, conjunctive pink. Ears: Normal auditory acuity. Mouth: Dentition intact. No ulcers or lesions.  Neck: Supple, no lymphadenopathy or thyromegaly.  Lungs: Clear bilaterally to auscultation without wheezes, crackles or rhonchi. Heart: Irregular rhythm.  No murmur, rub or gallop appreciated.  Abdomen: Soft, nontender, non distended. No masses. No hepatosplenomegaly. Normoactive bowel sounds x  4 quadrants.  Rectal: Deferred. Musculoskeletal: Symmetrical with no gross deformities. Skin: Warm and dry. No rash or lesions on visible extremities. Extremities: No edema. Neurological: Alert oriented x 4, no focal deficits.  Psychological:  Alert and cooperative. Normal mood and affect.  ASSESSMENT AND PLAN:  64) 81 year old female with iron deficiency anemia. Hg 8.5 (baseline Hg 12.5).  Iron 31 - 43.  Ferritin 8.  FOBT negative.  No overt GI bleeding.  No upper GI symptoms.  Nonbloody diarrhea x3 months. -CBC, iron panel -EGD to rule out PUD/hiatal hernia/celiac disease and UGI malignancy. Colonoscopy to rule out colitis and colorectal cancer (unlikely colorectal cancer as her last colonoscopy in 2019 was negative for colon polyps. EGD and colonoscopy  benefits and risks discussed including risk with sedation, risk of bleeding, perforation and infection  -Note, the patient will require cardiac clearance prior to proceeding with an EGD/colonoscopy if her twelve-lead EKG shows any concerning arrhythmia i.e. A-fib -Continue Niferex-150 milligrams twice daily for now.  2) Diarrhea x 3 months -GI pathogen panel -EGD and colonoscopy as ordered above -Push fluids, diet as tolerated  3) History of a small adenomatous polyp per colonoscopy 01/2002, no colon polyps per colonoscopy in 2007 and 2013.  Her most recent colonoscopy 05/07/2017 showed diverticulosis, internal hemorrhoids and no colon polyps.  4) Irregular heart rhythm on exam, rate controlled.  Patient is asymptomatic, no chest pain, dizziness, palpitations or shortness of breath. -Patient was instructed to contact Dr. Glori Bickers for twelve-lead EKG to be done today, if Dr. Marliss Coots office does not have the availability to do an EKG today the patient will go to urgent care to have done -CMP and magnesium level -Patient instructed to go to the emergency room if she develops any chest pain, palpitations, dizziness, shortness of breath or profound  fatigue.       CC:  Tower, Wynelle Fanny, MD

## 2021-06-14 NOTE — Discharge Instructions (Addendum)
-  Your EKG looks unchanged from the EKG 5/22.  This is a good thing. ?-If you develop new symptoms like chest pain, shortness of breath, dizziness, weakness-head to the emergency department. ?

## 2021-06-14 NOTE — Patient Instructions (Addendum)
You have been scheduled for an EGD and Colonoscopy. Please follow the written instructions given to you at your visit today. ?If you use inhalers (even only as needed), please bring them with you on the day of your procedure. ? ? ?RECOMMENDATIONS: ? ?Contact your primary care provider to have an EKG today due to irregular heart rhythm on exam. If they are not able to do that today then please go to an urgent care to have it done today. ?Go to the emergency room if you develop chest pain, palpitations, dizziness, shortness of breath or profound fatigue. ? ?Please proceed to the basement level for lab work before leaving today. Press "B" on the elevator. The lab is located at the first door on the left as you exit the elevator. They will also give you a stool test to take home. ? ?Thank you for trusting me with your gastrointestinal care!   ? ?Noralyn Pick, CRNP ? ? ? ?BMI: ? ?If you are age 82 or older, your body mass index should be between 23-30. Your Body mass index is 32.07 kg/m?Marland Kitchen If this is out of the aforementioned range listed, please consider follow up with your Primary Care Provider. ? ?If you are age 61 or younger, your body mass index should be between 19-25. Your Body mass index is 32.07 kg/m?Marland Kitchen If this is out of the aformentioned range listed, please consider follow up with your Primary Care Provider.  ? ?MY CHART: ? ?The [facility] providers would like to encourage you to use [facility] to communicate with providers for non-urgent requests or questions.  Due to long hold times on the telephone, sending your provider a message by [facility] may be a faster and more efficient way to get a response.  Please allow 48 business hours for a response.  Please remember that this is for non-urgent requests.  ? ?

## 2021-06-14 NOTE — Telephone Encounter (Signed)
That is new for her  ?I agree with that advisement as I am out of the office this afternoon ?Please check on her tomorrow and update me  ?I will watch for correspondence  ?

## 2021-06-14 NOTE — Telephone Encounter (Signed)
Patient called, was leaving Gastro at [facility]. They told her she had an irregular heartbeat and needed to have an EKG today. No appointments today at [facility], patient agreed to go to [facility] urgent care in [place] ?

## 2021-06-14 NOTE — ED Triage Notes (Signed)
Pt was sent from gastroenterology for possible new onset Afib. Pt does not have any sx.  ?

## 2021-06-15 ENCOUNTER — Other Ambulatory Visit: Payer: Medicare PPO

## 2021-06-15 DIAGNOSIS — Z86010 Personal history of colonic polyps: Secondary | ICD-10-CM

## 2021-06-15 DIAGNOSIS — R197 Diarrhea, unspecified: Secondary | ICD-10-CM

## 2021-06-15 DIAGNOSIS — D509 Iron deficiency anemia, unspecified: Secondary | ICD-10-CM

## 2021-06-15 LAB — IRON,TIBC AND FERRITIN PANEL
%SAT: 12 % (calc) — ABNORMAL LOW (ref 16–45)
Ferritin: 11 ng/mL — ABNORMAL LOW (ref 16–288)
Iron: 60 ug/dL (ref 45–160)
TIBC: 502 mcg/dL (calc) — ABNORMAL HIGH (ref 250–450)

## 2021-06-18 ENCOUNTER — Telehealth: Payer: Self-pay

## 2021-06-18 LAB — GI PROFILE, STOOL, PCR

## 2021-06-18 NOTE — Telephone Encounter (Signed)
Notified patient of negative test results. Patient states she is still having diarrhea. It has not gotten better or wore, it is still the same. ?

## 2021-06-18 NOTE — Progress Notes (Signed)
Sierra Sparks, please contact the patient let her know Dr. Henrene Pastor reviewed her office consult and EKG findings and he verified the patient needs to have a cardiac clearance prior to proceeding with her EGD and colonoscopy which is currently scheduled 07/30/2021.  Cardiac clearance requested due to her irregular heart rhythm.  Please let me know when patient has scheduled an appointment with cardiology as we may need to reschedule her procedure date to allow time for her cardiac evaluation.  Thank you ?

## 2021-06-18 NOTE — Telephone Encounter (Signed)
-----   Message from Noralyn Pick, NP sent at 06/18/2021  8:04 AM EST ----- ?Sierra Sparks, pls inform patient her GI pathogen panel was negative. Pls let me know if she is still having diarrhea. THX  ?

## 2021-06-18 NOTE — Telephone Encounter (Signed)
Spoke with patient. Patient went to Urgent Care in [place] notes in epic. Patient is feeling ok, no new symptoms. Scheduled follow up with Dr Glori Bickers. ?

## 2021-06-18 NOTE — Telephone Encounter (Signed)
Urgent care visit and ekg there is reassuring  ?We will review further on 3/10 at her visit  ? ?If any thing changes before then please let me know ?

## 2021-06-19 ENCOUNTER — Other Ambulatory Visit: Payer: Self-pay

## 2021-06-19 ENCOUNTER — Telehealth: Payer: Self-pay

## 2021-06-19 ENCOUNTER — Ambulatory Visit
Admission: RE | Admit: 2021-06-19 | Discharge: 2021-06-19 | Disposition: A | Discharge status: Home / Self Care | Payer: Medicare PPO | Source: Ambulatory Visit | Attending: Family Medicine | Admitting: Family Medicine

## 2021-06-19 DIAGNOSIS — Z86010 Personal history of colonic polyps: Secondary | ICD-10-CM

## 2021-06-19 DIAGNOSIS — E2839 Other primary ovarian failure: Secondary | ICD-10-CM | POA: Insufficient documentation

## 2021-06-19 DIAGNOSIS — M8589 Other specified disorders of bone density and structure, multiple sites: Secondary | ICD-10-CM | POA: Diagnosis not present

## 2021-06-19 DIAGNOSIS — R9431 Abnormal electrocardiogram [ECG] [EKG]: Secondary | ICD-10-CM

## 2021-06-19 DIAGNOSIS — R197 Diarrhea, unspecified: Secondary | ICD-10-CM

## 2021-06-19 DIAGNOSIS — D509 Iron deficiency anemia, unspecified: Secondary | ICD-10-CM

## 2021-06-19 NOTE — Telephone Encounter (Signed)
Patient will need to establish care initially with a physician. She may follow-up with Christell Faith, PA at future appointments. ? ?Primary Cardiologist:None ? ?Chart reviewed as part of pre-operative protocol coverage. Because of SHALINA NORFOLK past medical history and time since last visit, he/she will require a follow-up visit in order to better assess preoperative cardiovascular risk. ? ?Pre-op covering staff: ?- Please schedule appointment and call patient to inform them. ?- Please contact requesting surgeon's office via preferred method (i.e, phone, fax) to inform them of need for appointment prior to surgery. ? ?If applicable, this message will also be routed to pharmacy pool and/or primary cardiologist for input on holding anticoagulant/antiplatelet agent as requested below so that this information is available at time of patient's appointment.  ? ?Emmaline Life, NP-C ? ?  ?06/19/2021, 11:52 AM ?[place] ?[address] ?Office [phone] Fax [phone] ? ?

## 2021-06-19 NOTE — Telephone Encounter (Signed)
Pt has new pt appt 07/23/21 with Dr. Rockey Situ. Will update the requesting office the pt has appt 07/23/21 ?

## 2021-06-19 NOTE — Telephone Encounter (Signed)
[facility] Pre-operative Risk Assessment  ?   ?Sierra Sparks ?06/01/1940 ?[id number] ? ?Procedure: EGD and Colonoscopy ?Anesthesia type:  MAC ?Procedure Date: 07/30/21 ?Provider: Dr. Henrene Pastor ? ?Type of Clearance needed: Cardiac Clearance ? ?Please review request and advise by either responding to this message or by sending your response to the fax # provided below. ? ?Thank you, ? ?[facility]  ?Phone: [phone] ?Fax: [phone] ?ATTENTION: Aviance [place], LPN ? ? ? ?

## 2021-06-19 NOTE — Telephone Encounter (Signed)
Pt needs a NEW PT APPT for pre op clearance. I have sent a message to our chart prep team and scheduling team to call the pt with an NEW PT APP   ?

## 2021-06-19 NOTE — Telephone Encounter (Signed)
Per Carl Best, NP's request, routing this message to inform her of the date/time of her cardiology appt. ?

## 2021-06-19 NOTE — Progress Notes (Signed)
Pt scheduled 07/30/21 for EGD and colonoscopy with Dr. Henrene Pastor. Requires cardiac clearance d/t abnormal EKG. However, after speaking with pt, she states she is not established with a cardiologist. Amb referral placed to Christell Faith, PA-C per pt request. Pt aware she will need to schedule an appt to establish care prior to clearance being provided. Pt also aware procedures may need to be delayed to allow ample time for clearance, although referral HAS been placed as URGENT. Will await response from [facility] re: date/time of appt. Clearance request has also been sent and will await response re: clearance. ?

## 2021-06-21 NOTE — Telephone Encounter (Signed)
Sierra Sparks, patient can take Imodium one tab twice daily as needed since her GI pathogen panel was negative. Hold Imodium if no BM in 24 hrs. Push fluids. Diet as tolerated but avoid dairy and fatty foods. Patient to proceed with her cardiac evaluation 07/23/2021, she needs cardiac clearance prior to proceeding with her scheduled EGD/colonoscopy 07/30/2021. ?

## 2021-06-22 ENCOUNTER — Other Ambulatory Visit: Payer: Self-pay

## 2021-06-22 ENCOUNTER — Encounter: Payer: Self-pay | Admitting: Family Medicine

## 2021-06-22 ENCOUNTER — Ambulatory Visit: Payer: Medicare PPO | Admitting: Family Medicine

## 2021-06-22 VITALS — BP 120/74 | HR 86 | Temp 97.1°F | Ht 61.75 in | Wt 172.0 lb

## 2021-06-22 DIAGNOSIS — I499 Cardiac arrhythmia, unspecified: Secondary | ICD-10-CM | POA: Diagnosis not present

## 2021-06-22 DIAGNOSIS — D509 Iron deficiency anemia, unspecified: Secondary | ICD-10-CM

## 2021-06-22 DIAGNOSIS — I10 Essential (primary) hypertension: Secondary | ICD-10-CM

## 2021-06-22 NOTE — Telephone Encounter (Signed)
Notified patient of message from [place] , patient expressed understanding and agreement, no further questions at this time. ?Reminder set to send for clearance. ? ?

## 2021-06-22 NOTE — Progress Notes (Signed)
Subjective:    Patient ID: Sierra Sparks, female    DOB: 07/12/1940, 81 y.o.   MRN: [id number]  This visit occurred during the SARS-CoV-2 public health emergency.  Safety protocols were in place, including screening questions prior to the visit, additional usage of staff PPE, and extensive cleaning of exam room while observing appropriate contact time as indicated for disinfecting solutions.   HPI Pt presents for f/u of irregular HR   This was noted at a visit to GI incidentally  Was being scheduled on 4/17 for EGD and colonoscopy for anemia  Went to UC in [place] and felt ok   Urgent referral was done to cardiology/ has appt with Dr Rockey Situ on 4/10   Bp was elevated  EKG showed NSR with a few PACs unchanged from 08/2020 ekg  NSR with rate of 76  PR interval 140 ms  Inv T wave in V1-V3 as on prior  No symptoms noted She was re assured   No palpitations  No chest pain     Caffeine intake - some tea  Her coffee is decaf   BP Readings from Last 3 Encounters:  06/22/21 120/74  06/14/21 (!) 157/71  06/14/21 (!) 148/90   Pulse Readings from Last 3 Encounters:  06/22/21 86  06/14/21 82  06/14/21 64   Lab Results  Component Value Date   CHOL 130 05/11/2021   HDL 49.10 05/11/2021   LDLCALC 60 05/11/2021   LDLDIRECT 143.2 08/05/2012   TRIG 101.0 05/11/2021   CHOLHDL 3 05/11/2021   Taking a statin  The 10-year ASCVD risk score (Arnett DK, et al., 2019) is: 25.2%   Values used to calculate the score:     Age: 53 years     Sex: Female     Is Non-Hispanic African American: No     Diabetic: No     Tobacco smoker: No     Systolic Blood Pressure: 063 mmHg     Is BP treated: Yes     HDL Cholesterol: 49.1 mg/dL     Total Cholesterol: 130 mg/dL   Taking iron niferex 150 mg bid  Does not feel any different   Lab Results  Component Value Date   WBC 5.4 06/14/2021   HGB 9.1 (L) 06/14/2021   HCT 29.4 (L) 06/14/2021   MCV 70.7 (L) 06/14/2021   PLT 302.0  06/14/2021   Lab Results  Component Value Date   IRON 60 06/14/2021   TIBC 502 (H) 06/14/2021   FERRITIN 11 (L) 06/14/2021    Labs did improve with iron   Patient Active Problem List   Diagnosis Date Noted   Irregular heart beat 06/22/2021   Colon cancer screening 05/18/2021   Encounter for screening mammogram for breast cancer 03/31/2017   Hearing loss in right ear 08/30/2016   Anal fissure 03/06/2016   Internal hemorrhoids 02/26/2016   Routine general medical examination at a health care facility 02/03/2015   Estrogen deficiency 02/03/2015   Encounter for Medicare annual wellness exam 08/12/2012   Glaucoma 08/12/2012   History of colon polyps 05/15/2011   Anemia 05/15/2011   ALLERGIC RHINITIS 03/02/2008   Osteopenia 01/05/2007   HYPERCHOLESTEROLEMIA 10/24/2006   Depression with anxiety 10/24/2006   Essential hypertension 10/24/2006   HEMORRHOIDS 10/24/2006   FIBROCYSTIC BREAST DISEASE 10/24/2006   OSTEOARTHRITIS 10/24/2006   Past Medical History:  Diagnosis Date   Allergic rhinitis, cause unspecified    Allergy    Anemia    Anxiety state, unspecified  Arthritis    Cataract    forming    Colon polyps    Depressive disorder, not elsewhere classified    Diffuse cystic mastopathy    Disorder of bone and cartilage, unspecified    Diverticulosis    External hemorrhoids    Gallstones    GERD (gastroesophageal reflux disease)    Glaucoma    narrow angle thatwas corrected with laser surgery    Osteoarthrosis, unspecified whether generalized or localized, unspecified site    hands   Osteoporosis    Pure hypercholesterolemia    Thyroid disease    years past- no meds currently    Unspecified essential hypertension    Unspecified hemorrhoids without mention of complication    Past Surgical History:  Procedure Laterality Date   BREAST BIOPSY Left 08/11/2020   11:00 3cmfn Q clip u/s bx path pending   BREAST EXCISIONAL BIOPSY Left 08/25/2020   surgical exc  papilloma with tag placement tag no.[id number]   BREAST LUMPECTOMY WITH RADIO FREQUENCY LOCALIZER Left 08/25/2020   Procedure: BREAST LUMPECTOMY WITH RADIO FREQUENCY LOCALIZER;  Surgeon: Benjamine Sprague, DO;  Location: [facility] ORS;  Service: General;  Laterality: Left;   BUNIONECTOMY Left 10/1999   CESAREAN SECTION  1981   CHOLECYSTECTOMY     COLONOSCOPY  01/2002   polyp; hemorrhoids;diverticulosis   COLONOSCOPY  2013   diverticulosis   DEXA  06/2000   osteopenia   DEXA  04/2003   Stable   HAND SURGERY Left    DIP fusion-left   HEMORRHOID SURGERY     POLYPECTOMY     TONSILLECTOMY     childhood   Social History   Tobacco Use   Smoking status: Never   Smokeless tobacco: Never  Vaping Use   Vaping Use: Never used  Substance Use Topics   Alcohol use: Yes    Comment: Occasional-wine, 3 per week   Drug use: No   Family History  Problem Relation Age of Onset   Stroke Mother    Coronary artery disease Mother 77   Hypertension Mother    Hyperlipidemia Mother    Arthritis Father    Lung cancer Father        + smoker   Hypertension Sister    Migraines Sister    Colon cancer Brother        ? primary colon   Liver cancer Brother    Cancer Brother        type unknown   Stroke Maternal Grandmother        hemorrhagic   Cancer Maternal Grandmother        female cancer type unknown   Bone cancer Paternal Uncle    Esophageal cancer Neg Hx    Rectal cancer Neg Hx    Stomach cancer Neg Hx    Breast cancer Neg Hx    Colon polyps Neg Hx    No Known Allergies Current Outpatient Medications on File Prior to Visit  Medication Sig Dispense Refill   amLODipine (NORVASC) 5 MG tablet Take 1 tablet (5 mg total) by mouth daily. 90 tablet 3   Ascorbic Acid (VITAMIN C) 500 MG CAPS Take 500 mg by mouth daily.     Calcium Carbonate-Vit D-Min (CALCIUM 1200) 1200-1000 MG-UNIT CHEW Chew 600 mg by mouth 2 (two) times daily.     FLUoxetine (PROZAC) 20 MG capsule Take 1 capsule (20 mg total) by mouth  daily. 90 capsule 3   iron polysaccharides (NIFEREX) 150 MG capsule Take 1 capsule (150  mg total) by mouth 2 (two) times daily. 60 capsule 2   Melatonin 10 MG TABS Take 10 mg by mouth daily.     Multiple Vitamin (MULTIVITAMIN) tablet Take 1 tablet by mouth daily.     naproxen sodium (ALEVE) 220 MG tablet Take 220 mg by mouth daily.     rosuvastatin (CRESTOR) 10 MG tablet Take 1 tablet (10 mg total) by mouth daily. 90 tablet 3   No current facility-administered medications on file prior to visit.     Review of Systems  Constitutional:  Positive for fatigue. Negative for activity change, appetite change, fever and unexpected weight change.  HENT:  Negative for congestion, ear pain, rhinorrhea, sinus pressure and sore throat.   Eyes:  Negative for pain, redness and visual disturbance.  Respiratory:  Negative for cough, shortness of breath and wheezing.   Cardiovascular:  Negative for chest pain, palpitations and leg swelling.  Gastrointestinal:  Negative for abdominal pain, blood in stool, constipation and diarrhea.  Endocrine: Negative for polydipsia and polyuria.  Genitourinary:  Negative for dysuria, frequency and urgency.  Musculoskeletal:  Negative for arthralgias, back pain and myalgias.  Skin:  Negative for pallor and rash.  Allergic/Immunologic: Negative for environmental allergies.  Neurological:  Negative for dizziness, syncope and headaches.  Hematological:  Negative for adenopathy. Does not bruise/bleed easily.  Psychiatric/Behavioral:  Negative for decreased concentration and dysphoric mood. The patient is not nervous/anxious.       Objective:   Physical Exam Constitutional:      General: She is not in acute distress.    Appearance: Normal appearance. She is well-developed. She is obese. She is not ill-appearing or diaphoretic.  HENT:     Head: Normocephalic and atraumatic.  Eyes:     General: No scleral icterus.    Conjunctiva/sclera: Conjunctivae normal.     Pupils:  Pupils are equal, round, and reactive to light.  Neck:     Thyroid: No thyromegaly.     Vascular: No carotid bruit or JVD.  Cardiovascular:     Rate and Rhythm: Normal rate and regular rhythm.     Pulses: Normal pulses.     Heart sounds: Normal heart sounds. No murmur heard.   No gallop.     Comments: Heard one PAC with 30 seconds of auscultation  Pulmonary:     Effort: Pulmonary effort is normal. No respiratory distress.     Breath sounds: Normal breath sounds. No wheezing or rales.  Abdominal:     General: Bowel sounds are normal. There is no distension or abdominal bruit.     Palpations: Abdomen is soft. There is no mass.     Tenderness: There is no abdominal tenderness.  Musculoskeletal:     Cervical back: Normal range of motion and neck supple.     Right lower leg: No edema.     Left lower leg: No edema.  Lymphadenopathy:     Cervical: No cervical adenopathy.  Skin:    General: Skin is warm and dry.     Coloration: Skin is not jaundiced or pale.     Findings: No bruising, erythema or rash.  Neurological:     Mental Status: She is alert.     Cranial Nerves: No cranial nerve deficit.     Coordination: Coordination normal.     Deep Tendon Reflexes: Reflexes are normal and symmetric. Reflexes normal.  Psychiatric:        Mood and Affect: Mood normal.  Assessment & Plan:   Problem List Items Addressed This Visit       Cardiovascular and Mediastinum   Essential hypertension    bp in fair control at this time  BP Readings from Last 1 Encounters:  06/22/21 120/74  No changes needed-this was elevated at her GI and UC appts and now back to normal   Most recent labs reviewed  Disc lifstyle change with low sodium diet and exercise  Plan to continue amlodipine 5 mg daily        Other   Anemia    In the midst of GI w/u for iron def  GI panel was nl  Iron level is nl (low ferritin) with niferex 150 bid  Planning EGD and colonsc after cardiol clearance        Irregular heart beat - Primary    Incidental finding at GI visit  Notes rev  Then UC visit  EKG showed PACs (no change from prior) Reviewed EKG with pt  No symptoms  inst to avoid caffeine  inst to call if palpitations/cp or other symptoms  Planning cardiology visit /to clear for her endoscopies

## 2021-06-22 NOTE — Patient Instructions (Addendum)
Avoid caffeine if you can  ?Alert [place] if you have palpitations or chest pain or other symptoms  ? ?See cardiology as planned  ? ?Then get your GI tests as planned  ? ?

## 2021-06-22 NOTE — Assessment & Plan Note (Signed)
Incidental finding at GI visit  ?Notes rev  ?Then UC visit  ?EKG showed PACs (no change from prior) ?Reviewed EKG with pt  ?No symptoms  ?inst to avoid caffeine  ?inst to call if palpitations/cp or other symptoms  ?Planning cardiology visit /to clear for her endoscopies ?

## 2021-06-22 NOTE — Assessment & Plan Note (Signed)
In the midst of GI w/u for iron def  ?GI panel was nl  ?Iron level is nl (low ferritin) with niferex 150 bid  ?Planning EGD and colonsc after cardiol clearance  ?

## 2021-06-22 NOTE — Assessment & Plan Note (Addendum)
bp in fair control at this time  ?BP Readings from Last 1 Encounters:  ?06/22/21 120/74  ? ?No changes needed-this was elevated at her GI and UC appts and now back to normal  ? ?Most recent labs reviewed  ?Disc lifstyle change with low sodium diet and exercise  ?Plan to continue amlodipine 5 mg daily ?

## 2021-06-25 ENCOUNTER — Other Ambulatory Visit: Payer: Self-pay | Admitting: Family Medicine

## 2021-06-27 ENCOUNTER — Telehealth: Payer: Self-pay | Admitting: Family Medicine

## 2021-06-27 NOTE — Telephone Encounter (Signed)
Pt called stating that the pharmacy stated that medication rosuvastatin (CRESTOR) 10 MG tablet is on back order. Pt states that pharmacy is asking if you prescribe a higher dose that she can cut in half. Please advise.  ?

## 2021-06-28 NOTE — Telephone Encounter (Signed)
Please send in the 20 mg and she can cut in half ?Thanks ! ?

## 2021-06-29 ENCOUNTER — Other Ambulatory Visit: Payer: Self-pay

## 2021-06-29 DIAGNOSIS — D509 Iron deficiency anemia, unspecified: Secondary | ICD-10-CM

## 2021-06-29 NOTE — Telephone Encounter (Signed)
I spoke with patient & she was give 60 tablets of the 10 mg Crestor. She will use those & if still on back order she will let us know so we can send the 20 mg.  ?

## 2021-07-10 ENCOUNTER — Ambulatory Visit: Payer: Medicare PPO | Admitting: Family Medicine

## 2021-07-17 ENCOUNTER — Encounter: Payer: Self-pay | Admitting: Family Medicine

## 2021-07-17 ENCOUNTER — Ambulatory Visit: Payer: Medicare PPO | Admitting: Family Medicine

## 2021-07-17 VITALS — BP 136/86 | HR 97 | Temp 97.1°F | Ht 61.5 in | Wt 169.0 lb

## 2021-07-17 DIAGNOSIS — M8589 Other specified disorders of bone density and structure, multiple sites: Secondary | ICD-10-CM

## 2021-07-17 DIAGNOSIS — R42 Dizziness and giddiness: Secondary | ICD-10-CM | POA: Insufficient documentation

## 2021-07-17 MED ORDER — ALENDRONATE SODIUM 70 MG PO TABS: 70.0000 mg | ORAL_TABLET | ORAL | 11 refills | Status: DC

## 2021-07-17 NOTE — Assessment & Plan Note (Signed)
Vertigo a week ago w/o assoc neuro symptoms  ?Improved with dramamine  ?Feels a little dizziness with movement now, much improved ?Reassuring exam  ?Will monitor  ?Disc s/s of stroke to watch for/ ER prec disc ?If not resolved in a week inst to follow up  ?

## 2021-07-17 NOTE — Patient Instructions (Signed)
Change position slowly  ?Use a walker if you feel very unsteady  ?Don't drive if dizzy ?Dramamine is ok as needed    ? ?If worse or not improving in a week please let us know  ? ?If new symptoms alert [place] /go to the hospital if severe  ? ? ?Start the alendronate as directed ?If side effects let us know and hold it  ?Plan for 5 years if no problems  ? ?Continue exercise and supplements  ? ? ?

## 2021-07-17 NOTE — Progress Notes (Signed)
? ?Subjective:  ? ? Patient ID: Sierra Sparks, female    DOB: [birth year], 81 y.o.   MRN: [id number] ? ?HPI ?Here for f/u of dexa and vertigo ? ?Wt Readings from Last 3 Encounters:  ?07/17/21 169 lb (76.7 kg)  ?06/22/21 172 lb (78 kg)  ?06/14/21 172 lb 8 oz (78.2 kg)  ? ?31.42 kg/m? ? ?Had vertigo (first time)   just over a week ago ?She did vomit a week ago  ?Room was spinning ?Took dramamine  ?Much better - still feels like she is moving  ?No headache ?No sinus issues  ? ? ?She took 2 covid tests  ?No stroke symptoms  ? ? ?Dexa last month  ?Frax 16.2% and 5% ?LS T score -1.9 down from -1.7 ?FN right -2.3 down from -2.0 ? ?Had a stress fracture in her foot  ?Would like to prevent more  ? ?No family h/o OP ?No prednisone  ?No thyroid problems  ?Does exercise -walking / gym (not as much lately)  ? ? ?No dental issues ?Gets cleanings an that is it  ? ?Patient Active Problem List  ? Diagnosis Date Noted  ? Vertigo 07/17/2021  ? Irregular heart beat 06/22/2021  ? Colon cancer screening 05/18/2021  ? Encounter for screening mammogram for breast cancer 03/31/2017  ? Hearing loss in right ear 08/30/2016  ? Anal fissure 03/06/2016  ? Internal hemorrhoids 02/26/2016  ? Routine general medical examination at a health care facility 02/03/2015  ? Estrogen deficiency 02/03/2015  ? Encounter for Medicare annual wellness exam 08/12/2012  ? Glaucoma 08/12/2012  ? History of colon polyps 05/15/2011  ? Anemia 05/15/2011  ? ALLERGIC RHINITIS 03/02/2008  ? Osteopenia 01/05/2007  ? HYPERCHOLESTEROLEMIA 10/24/2006  ? Depression with anxiety 10/24/2006  ? Essential hypertension 10/24/2006  ? HEMORRHOIDS 10/24/2006  ? FIBROCYSTIC BREAST DISEASE 10/24/2006  ? OSTEOARTHRITIS 10/24/2006  ? ?Past Medical History:  ?Diagnosis Date  ? Allergic rhinitis, cause unspecified   ? Allergy   ? Anemia   ? Anxiety state, unspecified   ? Arthritis   ? Cataract   ? forming   ? Colon polyps   ? Depressive disorder, not elsewhere classified   ? Diffuse  cystic mastopathy   ? Disorder of bone and cartilage, unspecified   ? Diverticulosis   ? External hemorrhoids   ? Gallstones   ? GERD (gastroesophageal reflux disease)   ? Glaucoma   ? narrow angle thatwas corrected with laser surgery   ? Osteoarthrosis, unspecified whether generalized or localized, unspecified site   ? hands  ? Osteoporosis   ? Pure hypercholesterolemia   ? Thyroid disease   ? years past- no meds currently   ? Unspecified essential hypertension   ? Unspecified hemorrhoids without mention of complication   ? ?Past Surgical History:  ?Procedure Laterality Date  ? BREAST BIOPSY Left 08/11/2020  ? 11:00 3cmfn Q clip u/s bx path pending  ? BREAST EXCISIONAL BIOPSY Left 08/25/2020  ? surgical exc papilloma with tag placement tag no.[id number]  ? BREAST LUMPECTOMY WITH RADIO FREQUENCY LOCALIZER Left 08/25/2020  ? Procedure: BREAST LUMPECTOMY WITH RADIO FREQUENCY LOCALIZER;  Surgeon: Benjamine Sprague, DO;  Location: [facility] ORS;  Service: General;  Laterality: Left;  ? BUNIONECTOMY Left 10/1999  ? [place]  ? CHOLECYSTECTOMY    ? COLONOSCOPY  01/2002  ? polyp; hemorrhoids;diverticulosis  ? COLONOSCOPY  2013  ? diverticulosis  ? DEXA  06/2000  ? osteopenia  ? DEXA  04/2003  ? Stable  ?  HAND SURGERY Left   ? DIP fusion-left  ? HEMORRHOID SURGERY    ? POLYPECTOMY    ? TONSILLECTOMY    ? childhood  ? ?Social History  ? ?Tobacco Use  ? Smoking status: Never  ? Smokeless tobacco: Never  ?Vaping Use  ? Vaping Use: Never used  ?Substance Use Topics  ? Alcohol use: Yes  ?  Comment: Occasional-wine, 3 per week  ? Drug use: No  ? ?Family History  ?Problem Relation Age of Onset  ? Stroke Mother   ? Coronary artery disease Mother 53  ? Hypertension Mother   ? Hyperlipidemia Mother   ? Arthritis Father   ? Lung cancer Father   ?     + smoker  ? Hypertension Sister   ? Migraines Sister   ? Colon cancer Brother   ?     ? primary colon  ? Liver cancer Brother   ? Cancer Brother   ?     type unknown  ? Stroke Maternal  Grandmother   ?     hemorrhagic  ? Cancer Maternal Grandmother   ?     female cancer type unknown  ? Bone cancer Paternal Uncle   ? Esophageal cancer Neg Hx   ? Rectal cancer Neg Hx   ? Stomach cancer Neg Hx   ? Breast cancer Neg Hx   ? Colon polyps Neg Hx   ? ?No Known Allergies ?Current Outpatient Medications on File Prior to Visit  ?Medication Sig Dispense Refill  ? amLODipine (NORVASC) 5 MG tablet Take 1 tablet (5 mg total) by mouth daily. 90 tablet 3  ? Ascorbic Acid (VITAMIN C) 500 MG CAPS Take 500 mg by mouth daily.    ? Calcium Carbonate-Vit D-Min (CALCIUM 1200) 1200-1000 MG-UNIT CHEW Chew 600 mg by mouth 2 (two) times daily.    ? FLUoxetine (PROZAC) 20 MG capsule Take 1 capsule (20 mg total) by mouth daily. 90 capsule 3  ? iron polysaccharides (NIFEREX) 150 MG capsule Take 1 capsule (150 mg total) by mouth 2 (two) times daily. 60 capsule 2  ? Melatonin 10 MG TABS Take 10 mg by mouth daily.    ? Multiple Vitamin (MULTIVITAMIN) tablet Take 1 tablet by mouth daily.    ? naproxen sodium (ALEVE) 220 MG tablet Take 220 mg by mouth daily.    ? rosuvastatin (CRESTOR) 10 MG tablet Take 1 tablet by mouth once daily 90 tablet 0  ? ?No current facility-administered medications on file prior to visit.  ?  ?Review of Systems  ?Constitutional:  Negative for activity change, appetite change, fatigue, fever and unexpected weight change.  ?HENT:  Negative for congestion, ear pain, rhinorrhea, sinus pressure and sore throat.   ?Eyes:  Negative for pain, redness and visual disturbance.  ?Respiratory:  Negative for cough, shortness of breath and wheezing.   ?Cardiovascular:  Negative for chest pain and palpitations.  ?Gastrointestinal:  Negative for abdominal pain, blood in stool, constipation and diarrhea.  ?Endocrine: Negative for polydipsia and polyuria.  ?Genitourinary:  Negative for dysuria, frequency and urgency.  ?Musculoskeletal:  Negative for arthralgias, back pain and myalgias.  ?Skin:  Negative for pallor and rash.   ?Allergic/Immunologic: Negative for environmental allergies.  ?Neurological:  Positive for dizziness. Negative for syncope, facial asymmetry, speech difficulty, weakness, numbness and headaches.  ?Hematological:  Negative for adenopathy. Does not bruise/bleed easily.  ?Psychiatric/Behavioral:  Negative for decreased concentration and dysphoric mood. The patient is not nervous/anxious.   ? ?   ?  Objective:  ? Physical Exam ?Constitutional:   ?   General: She is not in acute distress. ?   Appearance: Normal appearance. She is well-developed. She is obese. She is not ill-appearing or diaphoretic.  ?HENT:  ?   Head: Normocephalic and atraumatic.  ?   Mouth/Throat:  ?   Mouth: Mucous membranes are moist.  ?   Comments: Good dentition  ?Eyes:  ?   Conjunctiva/sclera: Conjunctivae normal.  ?   Pupils: Pupils are equal, round, and reactive to light.  ?Neck:  ?   Thyroid: No thyromegaly.  ?   Vascular: No carotid bruit or JVD.  ?Cardiovascular:  ?   Rate and Rhythm: Normal rate and regular rhythm.  ?   Heart sounds: Normal heart sounds.  ?  No gallop.  ?Pulmonary:  ?   Effort: Pulmonary effort is normal. No respiratory distress.  ?   Breath sounds: Normal breath sounds. No wheezing or rales.  ?Abdominal:  ?   General: There is no distension or abdominal bruit.  ?   Palpations: Abdomen is soft.  ?Musculoskeletal:  ?   Cervical back: Normal range of motion and neck supple.  ?   Right lower leg: No edema.  ?   Left lower leg: No edema.  ?   Comments: No kyphosis  ?Petitie frame  ?Lymphadenopathy:  ?   Cervical: No cervical adenopathy.  ?Skin: ?   General: Skin is warm and dry.  ?   Coloration: Skin is not pale.  ?   Findings: No rash.  ?Neurological:  ?   Mental Status: She is alert.  ?   Cranial Nerves: Cranial nerves 2-12 are intact. No cranial nerve deficit, dysarthria or facial asymmetry.  ?   Sensory: Sensation is intact.  ?   Motor: Motor function is intact. No weakness, tremor, abnormal muscle tone or pronator drift.  ?    Coordination: Coordination is intact. Romberg sign negative. Coordination normal. Finger-Nose-Finger Test normal.  ?   Gait: Gait is intact.  ?   Deep Tendon Reflexes: Reflexes are normal and symmetric. Re

## 2021-07-17 NOTE — Assessment & Plan Note (Signed)
T score is down slightly in LS and FN ?frax 16.2% and 5% ?Disc treatment options ?No recent or upcoming dental work or renal problems ?Px sent for alendronate weekly and handout given  ?Disc poss side eff  ?If well tolerated will treat for 5 y  ?Enc to continue exercise and supplements  ?Re check dexa in 2 y ?

## 2021-07-18 ENCOUNTER — Encounter: Payer: Self-pay | Admitting: Internal Medicine

## 2021-07-23 ENCOUNTER — Ambulatory Visit: Payer: Medicare PPO | Admitting: Cardiovascular Disease

## 2021-07-23 ENCOUNTER — Encounter: Payer: Self-pay | Admitting: Cardiovascular Disease

## 2021-07-23 VITALS — BP 140/90 | HR 88 | Ht 61.5 in | Wt 171.4 lb

## 2021-07-23 DIAGNOSIS — I498 Other specified cardiac arrhythmias: Secondary | ICD-10-CM

## 2021-07-23 DIAGNOSIS — I491 Atrial premature depolarization: Secondary | ICD-10-CM | POA: Diagnosis not present

## 2021-07-23 DIAGNOSIS — E782 Mixed hyperlipidemia: Secondary | ICD-10-CM

## 2021-07-23 DIAGNOSIS — Z01810 Encounter for preprocedural cardiovascular examination: Secondary | ICD-10-CM

## 2021-07-23 DIAGNOSIS — D500 Iron deficiency anemia secondary to blood loss (chronic): Secondary | ICD-10-CM

## 2021-07-23 DIAGNOSIS — R9431 Abnormal electrocardiogram [ECG] [EKG]: Secondary | ICD-10-CM

## 2021-07-23 DIAGNOSIS — D649 Anemia, unspecified: Secondary | ICD-10-CM | POA: Diagnosis not present

## 2021-07-23 DIAGNOSIS — E785 Hyperlipidemia, unspecified: Secondary | ICD-10-CM

## 2021-07-23 NOTE — Progress Notes (Signed)
Cardiology Office Note ? ?Date:  07/23/2021  ? ?ID:  Sierra Sparks, DOB 01-26-1941, MRN [id number] ? ?PCP:  Abner Greenspan, MD  ? ?Chief Complaint  ?Patient presents with  ? New Patient (Initial Visit)  ?  Ref by Dr. Henrene Pastor for an abnormal EKG and will need a cardiac clearance to have an EGD and colonoscopy; scheduled for July 27, 2021. Medications reviewed by the patient verbally. "Doing well."   ? ? ?HPI:  ?[name] is a 81 year old woman with past medical history of ?Anemia ?Essential hypertension ?Referred by Dr. Glori Bickers for consultation of her abnormal EKG, PACs, preop clearance for EGD and colonoscopy scheduled July 30, 2021 with Dr. Henrene Pastor ? ?Seen by GI, told that she had a regular rhythm ?Presented to urgent care ?Normal EKG as below ?Telemetry monitor with PACs ? ?EKG reviewed from June 14, 2021 showing ?Normal sinus rhythm rate 76 bpm no significant ST or T wave changes ? ?Having leg cramps, every morning ? ?Fell in house today, mechanical fall ?BP elevated this morning ? ?Goes to the gym 3x a week, denies chest pain or shortness of breath on exertion ?She is not troubled by palpitations, asymptomatic ? ?Lab work reviewed ?Prior cholesterol 222, now trending 130 ?LDL was 1 30-1 40 now trending 60 on Crestor 10 ? ?PMH:   has a past medical history of Allergic rhinitis, cause unspecified, Allergy, Anemia, Anxiety state, unspecified, Arthritis, Cataract, Colon polyps, Depressive disorder, not elsewhere classified, Diffuse cystic mastopathy, Disorder of bone and cartilage, unspecified, Diverticulosis, External hemorrhoids, Gallstones, GERD (gastroesophageal reflux disease), Glaucoma, Osteoarthrosis, unspecified whether generalized or localized, unspecified site, Osteoporosis, Pure hypercholesterolemia, Thyroid disease, Unspecified essential hypertension, and Unspecified hemorrhoids without mention of complication. ? ?PSH:    ?Past Surgical History:  ?Procedure Laterality Date  ? BREAST BIOPSY Left  08/11/2020  ? 11:00 3cmfn Q clip u/s bx path pending  ? BREAST EXCISIONAL BIOPSY Left 08/25/2020  ? surgical exc papilloma with tag placement tag no.[id number]  ? BREAST LUMPECTOMY WITH RADIO FREQUENCY LOCALIZER Left 08/25/2020  ? Procedure: BREAST LUMPECTOMY WITH RADIO FREQUENCY LOCALIZER;  Surgeon: Benjamine Sprague, DO;  Location: [facility] ORS;  Service: General;  Laterality: Left;  ? BUNIONECTOMY Left 10/1999  ? [place]  ? CHOLECYSTECTOMY    ? COLONOSCOPY  01/2002  ? polyp; hemorrhoids;diverticulosis  ? COLONOSCOPY  2013  ? diverticulosis  ? DEXA  06/2000  ? osteopenia  ? DEXA  04/2003  ? Stable  ? HAND SURGERY Left   ? DIP fusion-left  ? HEMORRHOID SURGERY    ? POLYPECTOMY    ? TONSILLECTOMY    ? childhood  ? ? ?Current Outpatient Medications  ?Medication Sig Dispense Refill  ? alendronate (FOSAMAX) 70 MG tablet Take 1 tablet (70 mg total) by mouth every 7 (seven) days. Take with a full glass of water on an empty stomach. 4 tablet 11  ? amLODipine (NORVASC) 5 MG tablet Take 1 tablet (5 mg total) by mouth daily. 90 tablet 3  ? Ascorbic Acid (VITAMIN C) 500 MG CAPS Take 500 mg by mouth daily.    ? Calcium Carbonate-Vit D-Min (CALCIUM 1200) 1200-1000 MG-UNIT CHEW Chew 600 mg by mouth 2 (two) times daily.    ? FLUoxetine (PROZAC) 20 MG capsule Take 1 capsule (20 mg total) by mouth daily. 90 capsule 3  ? iron polysaccharides (NIFEREX) 150 MG capsule Take 1 capsule (150 mg total) by mouth 2 (two) times daily. 60 capsule 2  ? Melatonin 10  MG TABS Take 10 mg by mouth daily.    ? Multiple Vitamin (MULTIVITAMIN) tablet Take 1 tablet by mouth daily.    ? naproxen sodium (ALEVE) 220 MG tablet Take 220 mg by mouth daily.    ? rosuvastatin (CRESTOR) 10 MG tablet Take 1 tablet by mouth once daily 90 tablet 0  ? ?No current facility-administered medications for this visit.  ? ? ?Allergies:   Patient has no known allergies.  ? ?Social History:  The patient  reports that she has never smoked. She has never used smokeless  tobacco. She reports current alcohol use. She reports that she does not use drugs.  ? ?Family History:   family history includes Arthritis in her father; Bone cancer in her paternal uncle; Cancer in her brother and maternal grandmother; Colon cancer in her brother; Coronary artery disease (age of onset: 13) in her mother; Hyperlipidemia in her mother; Hypertension in her mother and sister; Liver cancer in her brother; Lung cancer in her father; Migraines in her sister; Stroke in her maternal grandmother and mother.  ? ? ?Review of Systems: ?Review of Systems  ?Constitutional: Negative.   ?HENT: Negative.    ?Respiratory: Negative.    ?Cardiovascular: Negative.   ?Gastrointestinal: Negative.   ?Musculoskeletal: Negative.   ?Neurological: Negative.   ?Psychiatric/Behavioral: Negative.    ?All other systems reviewed and are negative. ? ? ?PHYSICAL EXAM: ?VS:  BP 140/90 (BP Location: Right Arm, Patient Position: Sitting, Cuff Size: Normal)   Pulse 88   Ht 5' 1.5" (1.562 m)   Wt 77.7 kg   SpO2 98%   BMI 31.86 kg/m?  , BMI Body mass index is 31.86 kg/m?. ?GEN: Well nourished, well developed, in no acute distress ?HEENT: normal ?Neck: no JVD, carotid bruits, or masses ?Cardiac: RRR; no murmurs, rubs, or gallops,no edema  ?Respiratory:  clear to auscultation bilaterally, normal work of breathing ?GI: soft, nontender, nondistended, + BS ?MS: no deformity or atrophy ?Skin: warm and dry, no rash ?Neuro:  Strength and sensation are intact ?Psych: euthymic mood, full affect ? ? ?Recent Labs: ?05/11/2021: TSH 2.07 ?06/14/2021: ALT 15; BUN 20; Creatinine, Ser 0.68; Hemoglobin 9.1; Magnesium 2.0; Platelets 302.0; Potassium 4.1; Sodium 138  ? ? ?Lipid Panel ?Lab Results  ?Component Value Date  ? CHOL 130 05/11/2021  ? HDL 49.10 05/11/2021  ? [place] 60 05/11/2021  ? TRIG 101.0 05/11/2021  ? ? ?Wt Readings from Last 3 Encounters:  ?07/23/21 77.7 kg  ?07/17/21 76.7 kg  ?06/22/21 78 kg  ?  ? ?ASSESSMENT AND PLAN: ? ?Problem List  Items Addressed This Visit   ? ? Anemia  ? ?Other Visit Diagnoses   ? ? Mixed hyperlipidemia    -  Primary  ? Abnormal EKG      ? Relevant Orders  ? EKG 12-Lead  ? Preop cardiovascular exam      ? Relevant Orders  ? EKG 12-Lead  ? ?  ? ?Preop cardiovascular evaluation for EGD/colonoscopy ?Procedure next week for anemia ?No acute cardiac issues that would warrant delaying the procedure, recommend she would be acceptable risk, no further cardiac testing needed at this time ? ?PACs ?By report noted on telemetry at urgent care ?Appreciated on exam today ?Asymptomatic, no strong indication to add beta-blockers if she did develop symptoms in the future, low-dose beta-blocker could be added ? ?Hyperlipidemia ?Numbers dramatically improved on Crestor 10 but she reports having significant cramping in the legs every morning ?Recommend she do a trial hold of the  Crestor to see if her symptoms improve ?If cramping does improve, potentially could try Crestor 10 every other day with Zetia to achieve similar low numbers without symptoms ?Suggested she call us if cramping does improve without the Crestor ? ?Anemia ?Within the past year hemoglobin down to 8-9 ?Scheduled for EGD colonoscopy next week ? ? ? Total encounter time more than 45 minutes ? Greater than 50% was spent in counseling and coordination of care with the patient ? ? ? ?Signed, ?Esmond Plants, M.D., Ph.D. ?[facility] [place], [place] ?[phone] ?

## 2021-07-23 NOTE — Patient Instructions (Addendum)
Medication Instructions:  ?No changes ? ?Do a trial hold on the crestor for cramps, ?If cramps go away, call the office  ? ?If you need a refill on your cardiac medications before your next appointment, please call your pharmacy.  ? ?Lab work: ?No new labs needed ? ?Testing/Procedures: ?No new testing needed ? ?Follow-Up: ?At [facility], you and your health needs are our priority.  As part of our continuing mission to provide you with exceptional heart care, we have created designated Provider Care Teams.  These Care Teams include your primary Cardiologist (physician) and Advanced Practice Providers (APPs -  Physician Assistants and Nurse Practitioners) who all work together to provide you with the care you need, when you need it. ? ?You will need a follow up appointment as needed ? ?Providers on your designated Care Team:   ?Murray Hodgkins, NP ?Christell Faith, PA-C ?Cadence Kathlen Mody, PA-C ? ?COVID-19 Vaccine Information can be found at: [email or web address] For questions related to vaccine distribution or appointments, please email [email or web address] or call [phone].  ? ?

## 2021-07-24 NOTE — Telephone Encounter (Signed)
From 07/23/21 NP OV with Dr. Rockey Situ: ? ?Preop cardiovascular evaluation for EGD/colonoscopy. Procedure next week for anemia. No acute cardiac issues that would warrant delaying the procedure, recommend she would be acceptable risk, no further cardiac testing needed at this time ? ?Routing this message to Dr. Henrene Pastor and Carl Best, CRNP for continuity of care purposes. ?

## 2021-07-24 NOTE — Telephone Encounter (Signed)
Noted. ?Keep procedures as planned. ?Thank you for the follow-up ?Dr. Henrene Pastor ?

## 2021-07-30 ENCOUNTER — Other Ambulatory Visit: Payer: Self-pay

## 2021-07-30 ENCOUNTER — Other Ambulatory Visit (INDEPENDENT_AMBULATORY_CARE_PROVIDER_SITE_OTHER): Payer: Medicare PPO

## 2021-07-30 ENCOUNTER — Encounter: Payer: Self-pay | Admitting: Internal Medicine

## 2021-07-30 ENCOUNTER — Ambulatory Visit (AMBULATORY_SURGERY_CENTER): Payer: Medicare PPO | Admitting: Internal Medicine

## 2021-07-30 VITALS — BP 148/89 | HR 73 | Temp 97.6°F | Resp 16 | Ht 61.0 in | Wt 172.0 lb

## 2021-07-30 DIAGNOSIS — K6389 Other specified diseases of intestine: Secondary | ICD-10-CM | POA: Diagnosis not present

## 2021-07-30 DIAGNOSIS — K648 Other hemorrhoids: Secondary | ICD-10-CM | POA: Diagnosis not present

## 2021-07-30 DIAGNOSIS — K219 Gastro-esophageal reflux disease without esophagitis: Secondary | ICD-10-CM | POA: Diagnosis not present

## 2021-07-30 DIAGNOSIS — R197 Diarrhea, unspecified: Secondary | ICD-10-CM

## 2021-07-30 DIAGNOSIS — D124 Benign neoplasm of descending colon: Secondary | ICD-10-CM

## 2021-07-30 DIAGNOSIS — K222 Esophageal obstruction: Secondary | ICD-10-CM | POA: Diagnosis not present

## 2021-07-30 DIAGNOSIS — K449 Diaphragmatic hernia without obstruction or gangrene: Secondary | ICD-10-CM | POA: Diagnosis not present

## 2021-07-30 DIAGNOSIS — D509 Iron deficiency anemia, unspecified: Secondary | ICD-10-CM

## 2021-07-30 DIAGNOSIS — K635 Polyp of colon: Secondary | ICD-10-CM

## 2021-07-30 DIAGNOSIS — Z860100 Personal history of colon polyps, unspecified: Secondary | ICD-10-CM

## 2021-07-30 DIAGNOSIS — K5730 Diverticulosis of large intestine without perforation or abscess without bleeding: Secondary | ICD-10-CM

## 2021-07-30 DIAGNOSIS — I10 Essential (primary) hypertension: Secondary | ICD-10-CM | POA: Diagnosis not present

## 2021-07-30 MED ORDER — SODIUM CHLORIDE 0.9 % IV SOLN: 500.0000 mL | Freq: Once | INTRAVENOUS | Status: DC

## 2021-07-30 NOTE — Progress Notes (Signed)
Called to room to assist during endoscopic procedure.  Patient ID and intended procedure confirmed with present staff. Received instructions for my participation in the procedure from the performing physician.  

## 2021-07-30 NOTE — Progress Notes (Signed)
CHIEF COMPLAINT: Iron deficiency anemia ?  ?HISTORY OF PRESENT ILLNESS:  Sierra Sparks is a 81 year old female with a past medical history of anxiety, depression, hypertension, osteoporosis, glaucoma and colon polyps.  She presents to our office today as referred by Dr. Loura Pardon for further evaluation regarding iron deficiency anemia.  She underwent routine laboratory studies 05/11/2021 which showed a hemoglobin level of 8.5 (baseline hemoglobin level 12.5 ), hematocrit 27.3 and MCV 70.0.  Normal renal function with a BUN 15, creatinine 0.73 and GFR 78.77.  Pathologist smear review showed a few lymphocytes which appeared reactive, no immature cells identified and platelets were clumped and RBCs appeared to be microcytic and hypochromic suggestive of iron deficiency.  FOBT was negative on 05/22/2021.  Iron 43 and ferritin 8 on 05/18/2021.  Repeat iron 31 on 05/30/2021.  She complains of having fatigue for the past 4 months.  No chest pain or shortness of breath.  No dysphagia or heartburn.  No upper or lower abdominal pain.  She reports passing nonbloody explosive diarrhea once daily for the past 3 months.  No antibiotics or new medication within the past 6 months except for Niferex 150 mg twice daily which she started approximately 4 weeks ago.  Prior to the onset of diarrhea, she was passing a normal formed brown bowel movement daily.  No rectal bleeding or black stools.  She takes Aleve 1 tab daily for arthritis pain.  Remote history of GERD.  She denies ever having an EGD or history of ulcer/GI bleed.  Her most recent colonoscopy was 05/07/2017 which showed diverticulosis in the sigmoid and rectum, internal hemorrhoids and no polyps.  No further colonoscopies were recommended due to age at that point.  Brother was diagnosed with colon cancer in his early 59s.  No known family history of celiac disease or IBD.  She denies having any weight loss, in fact she has gained weight.  No fever, sweats or chills.  No  other complaints at this time. ?  ?CBC Latest Ref Rng & Units 05/30/2021 05/18/2021 05/11/2021  ?WBC 4.0 - 10.5 K/uL 4.8 5.1 4.3  ?Hemoglobin 12.0 - 15.0 g/dL 8.5(L) 8.6(L) 8.5 Repeated and verified X2.(L)  ?Hematocrit 36.0 - 46.0 % 28.5(L) 30.2(L) 27.3(L)  ?Platelets 150.0 - 400.0 K/uL 327.0 345 287.0  ?MCV 69.8 ?Iron was 43 and Ferritin 8 on 05/18/2021 ?Iron 31 on 05/30/2021 ?TSH 2.07 on 05/11/2021 ?  ?CMP Latest Ref Rng & Units 05/11/2021 05/11/2020 06/24/2019  ?Glucose 70 - 99 mg/dL 102(H) 98 -  ?BUN 6 - 23 mg/dL 15 22 -  ?Creatinine 0.40 - 1.20 mg/dL 0.73 0.75 -  ?Sodium 135 - 145 mEq/L 141 140 -  ?Potassium 3.5 - 5.1 mEq/L 4.3 4.5 -  ?Chloride 96 - 112 mEq/L 107 105 -  ?CO2 19 - 32 mEq/L 29 30 -  ?Calcium 8.4 - 10.5 mg/dL 9.2 9.8 -  ?Total Protein 6.0 - 8.3 g/dL 6.6 6.8 -  ?Total Bilirubin 0.2 - 1.2 mg/dL 0.3 0.5 -  ?Alkaline Phos 39 - 117 U/L 81 89 -  ?AST 0 - 37 U/L '19 20 19  '$ ?ALT 0 - 35 U/L '13 14 13  '$ ?  ?Colonoscopy by Dr. Henrene Pastor 05/07/2017: ?- Diverticulosis in the rectum and in the sigmoid colon. ?- Internal hemorrhoids. ?- The examination was otherwise normal on direct and retroflexion views. ?- No specimens collected. ?-No further colon polyp surveillance colonoscopies recommended due to age ?  ?Colonoscopy 07/11/2011: ?Scattered diverticulosis ?No polyps ?  ?  Colonoscopy 05/10/2005: ?Diverticulosis ?No polyp ?  ?Colonoscopy 02/10/2002: ?1 mm adenomatous polyp removed from the descending colon ?Sigmoid diverticulosis ?External hemorrhoid ?  ?    ?Past Medical History:  ?Diagnosis Date  ? Allergic rhinitis, cause unspecified    ? Allergy    ? Anemia    ? Anxiety state, unspecified    ? Cataract    ?  forming   ? Colon polyps    ? Depressive disorder, not elsewhere classified    ? Diffuse cystic mastopathy    ? Disorder of bone and cartilage, unspecified    ? Diverticulosis    ? External hemorrhoids    ? GERD (gastroesophageal reflux disease)    ? Glaucoma    ?  narrow angle thatwas corrected with laser surgery   ?  Osteoarthrosis, unspecified whether generalized or localized, unspecified site    ?  hands  ? Osteoporosis    ? Pure hypercholesterolemia    ? Thyroid disease    ?  years past- no meds currently   ? Unspecified essential hypertension    ? Unspecified hemorrhoids without mention of complication    ?  ?     ?Past Surgical History:  ?Procedure Laterality Date  ? BREAST BIOPSY Left 08/11/2020  ?  11:00 3cmfn Q clip u/s bx path pending  ? BREAST EXCISIONAL BIOPSY Left 08/25/2020  ?  surgical exc papilloma with tag placement tag no.[id number]  ? BREAST LUMPECTOMY WITH RADIO FREQUENCY LOCALIZER Left 08/25/2020  ?  Procedure: BREAST LUMPECTOMY WITH RADIO FREQUENCY LOCALIZER;  Surgeon: Benjamine Sprague, DO;  Location: [facility] ORS;  Service: General;  Laterality: Left;  ? BUNIONECTOMY   7/01  ?  left   ? [place]  ? CHOLECYSTECTOMY      ? COLONOSCOPY   10/03  ?  polyp; hemorrhoids;diverticulosis  ? COLONOSCOPY   2013  ?  diverticulosis  ? DEXA   3/02  ?  osteopenia  ? DEXA   1/05  ?  Stable  ? HAND SURGERY      ?  DIP fusion-left  ? HEMORRHOID SURGERY      ? POLYPECTOMY      ? TONSILLECTOMY      ?  childhood  ?  ?Social History: She is married.  She is a retired Radio producer.  She has 2 sons.  Nonsmoker. She drinks one rum and coke 4 days weekly.  No drug use ?  ?Family History: family history includes Arthritis in her father; Bone cancer in her paternal uncle; Colon cancer in her brother; Coronary artery disease (age of onset: 81) in her mother; Liver cancer in her brother; Lung cancer in her father; Stroke in her maternal grandmother and mother; Uterine cancer in her maternal grandmother. ?  ?No Known Allergies ?  ?    ?Outpatient Encounter Medications as of 06/14/2021  ?Medication Sig  ? amLODipine (NORVASC) 5 MG tablet Take 1 tablet (5 mg total) by mouth daily.  ? Ascorbic Acid (VITAMIN C) 500 MG CAPS Take 500 mg by mouth daily.  ? Calcium Carbonate-Vit D-Min (CALCIUM 1200) 1200-1000 MG-UNIT CHEW Chew 600 mg by mouth 2  (two) times daily.  ? FLUoxetine (PROZAC) 20 MG capsule Take 1 capsule (20 mg total) by mouth daily.  ? iron polysaccharides (NIFEREX) 150 MG capsule Take 1 capsule (150 mg total) by mouth 2 (two) times daily.  ? Melatonin 10 MG TABS Take 10 mg by mouth daily.  ? Multiple Vitamin (MULTIVITAMIN) tablet  Take 1 tablet by mouth daily.  ? naproxen sodium (ALEVE) 220 MG tablet Take 220 mg by mouth daily.  ? rosuvastatin (CRESTOR) 10 MG tablet Take 1 tablet (10 mg total) by mouth daily.  ?  ?No facility-administered encounter medications on file as of 06/14/2021.  ?  ?  ?REVIEW OF SYSTEMS: ?Gen: + Fatigue. Denies fever, sweats or chills. No weight loss.  ?CV: Denies chest pain, palpitations or edema. ?Resp: Denies cough, shortness of breath of hemoptysis.  ?GI: See HPI.   ?GU : Denies urinary burning, blood in urine, increased urinary frequency or incontinence. ?MS: + Arthritis, muscle cramps.  ?Derm: Denies rash, itchiness, skin lesions or unhealing ulcers. ?Psych: Denies depression, anxiety, memory loss, suicidal ideation and confusion. ?Heme: Denies bruising, easy bleeding. ?Neuro:  Denies headaches, dizziness or paresthesias. ?Endo:  Denies any problems with DM, thyroid or adrenal function. ?  ?PHYSICAL EXAM: ?BP (!) 148/90 (BP Location: Left Arm, Patient Position: Sitting, Cuff Size: Normal)   Pulse 64 Comment: irregular  Ht 5' 1.5" (1.562 m) Comment: height measured without shoes  Wt 172 lb 8 oz (78.2 kg)   BMI 32.07 kg/m?  ?  ?General: 81 year old female in no acute distress. ?Head: Normocephalic and atraumatic. ?Eyes:  Sclerae non-icteric, conjunctive pink. ?Ears: Normal auditory acuity. ?Mouth: Dentition intact. No ulcers or lesions.  ?Neck: Supple, no lymphadenopathy or thyromegaly.  ?Lungs: Clear bilaterally to auscultation without wheezes, crackles or rhonchi. ?Heart: Irregular rhythm.  No murmur, rub or gallop appreciated.  ?Abdomen: Soft, nontender, non distended. No masses. No hepatosplenomegaly.  Normoactive bowel sounds x 4 quadrants.  ?Rectal: Deferred. ?Musculoskeletal: Symmetrical with no gross deformities. ?Skin: Warm and dry. No rash or lesions on visible extremities. ?Extremities: No edema. ?Neurological: Al

## 2021-07-30 NOTE — Op Note (Signed)
[facility] ?Patient Name: Sierra Sparks ?Procedure Date: 07/30/2021 3:27 PM ?MRN: [id number] ?Endoscopist: Docia Chuck. Henrene Pastor , MD ?Age: 81 ?Referring MD:  ?Date of Birth: [birth year] ?Gender: Female ?Account #: [id number] ?Procedure:                Colonoscopy with cold snare polypectomy x 1; with  ?                          biopsies ?Indications:              Clinically significant diarrhea of unexplained  ?                          origin, Iron deficiency anemia. Also has a history  ?                          of adenomatous polyps. Prior colonoscopy  ?                          examinations 2003, 2007, 2013, 2019 ?Medicines:                Monitored Anesthesia Care ?Procedure:                Pre-Anesthesia Assessment: ?                          - Prior to the procedure, a History and Physical  ?                          was performed, and patient medications and  ?                          allergies were reviewed. The patient's tolerance of  ?                          previous anesthesia was also reviewed. The risks  ?                          and benefits of the procedure and the sedation  ?                          options and risks were discussed with the patient.  ?                          All questions were answered, and informed consent  ?                          was obtained. Prior Anticoagulants: The patient has  ?                          taken no previous anticoagulant or antiplatelet  ?                          agents. ASA Grade Assessment: II - A patient with  ?  mild systemic disease. After reviewing the risks  ?                          and benefits, the patient was deemed in  ?                          satisfactory condition to undergo the procedure. ?                          After obtaining informed consent, the colonoscope  ?                          was passed under direct vision. Throughout the  ?                          procedure, the patient's blood pressure,  pulse, and  ?                          oxygen saturations were monitored continuously. The  ?                          Olympus CF-HQ190L (Serial# [id number]) Colonoscope was  ?                          introduced through the anus and advanced to the the  ?                          cecum, identified by appendiceal orifice and  ?                          ileocecal valve. The terminal ileum, ileocecal  ?                          valve, appendiceal orifice, and rectum were  ?                          photographed. The quality of the bowel preparation  ?                          was excellent. The colonoscopy was performed  ?                          without difficulty. The patient tolerated the  ?                          procedure well. The bowel preparation used was  ?                          Miralax via split dose instruction. ?Scope In: 3:44:24 PM ?Scope Out: 3:59:46 PM ?Scope Withdrawal Time: 0 hours 12 minutes 16 seconds  ?Total Procedure Duration: 0 hours 15 minutes 22 seconds  ?Findings:                 The terminal ileum appeared normal. ?  A 3 mm polyp was found in the descending colon. The  ?                          polyp was removed with a cold snare. Resection and  ?                          retrieval were complete. ?                          Scattered medium-mouthed diverticula were found in  ?                          the left colon and right colon. ?                          Internal hemorrhoids were found during retroflexion. ?                          The entire examined colon appeared otherwise normal  ?                          on direct and retroflexion views. Biopsies for  ?                          histology were taken with a cold forceps from the  ?                          entire colon for evaluation of microscopic colitis. ?Complications:            No immediate complications. Estimated blood loss:  ?                          None. ?Estimated Blood Loss:     Estimated blood  loss: none. ?Impression:               - The examined portion of the ileum was normal. ?                          - One 3 mm polyp in the descending colon, removed  ?                          with a cold snare. Resected and retrieved. ?                          - Diverticulosis in the left colon and in the right  ?                          colon. ?                          - Internal hemorrhoids. ?                          - The entire examined colon is otherwise normal on  ?  direct and retroflexion views. ?Recommendation:           - Repeat colonoscopy is not recommended for  ?                          surveillance. ?                          - Patient has a contact number available for  ?                          emergencies. The signs and symptoms of potential  ?                          delayed complications were discussed with the  ?                          patient. Return to normal activities tomorrow.  ?                          Written discharge instructions were provided to the  ?                          patient. ?                          - Resume previous diet. ?                          - Continue present medications. ?                          - Await pathology results. ?                          -No cause for iron deficiency anemia found. Proceed  ?                          to upper endoscopy today ?Docia Chuck. Henrene Pastor, MD ?07/30/2021 4:08:07 PM ?This report has been signed electronically. ?

## 2021-07-30 NOTE — Progress Notes (Signed)
VS-CW  Pt's states no medical or surgical changes since previsit or office visit.  

## 2021-07-30 NOTE — Patient Instructions (Signed)
Please read handouts provided. ?Continue present medications. ?Await pathology results. ?Continue iron supplement twice daily. ?Office follow-up with Dr. Henrene Pastor in 6-8 weeks. ? ?YOU HAD AN ENDOSCOPIC PROCEDURE TODAY AT [facility]:   Refer to the procedure report that was given to you for any specific questions about what was found during the examination.  If the procedure report does not answer your questions, please call your gastroenterologist to clarify.  If you requested that your care partner not be given the details of your procedure findings, then the procedure report has been included in a sealed envelope for you to review at your convenience later. ? ?YOU SHOULD EXPECT: Some feelings of bloating in the abdomen. Passage of more gas than usual.  Walking can help get rid of the air that was put into your GI tract during the procedure and reduce the bloating. If you had a lower endoscopy (such as a colonoscopy or flexible sigmoidoscopy) you may notice spotting of blood in your stool or on the toilet paper. If you underwent a bowel prep for your procedure, you may not have a normal bowel movement for a few days. ? ?Please Note:  You might notice some irritation and congestion in your nose or some drainage.  This is from the oxygen used during your procedure.  There is no need for concern and it should clear up in a day or so. ? ?SYMPTOMS TO REPORT IMMEDIATELY: ? ?Following lower endoscopy (colonoscopy or flexible sigmoidoscopy): ? Excessive amounts of blood in the stool ? Significant tenderness or worsening of abdominal pains ? Swelling of the abdomen that is new, acute ? Fever of 100?F or higher ? ?Following upper endoscopy (EGD) ? Vomiting of blood or coffee ground material ? New chest pain or pain under the shoulder blades ? Painful or persistently difficult swallowing ? New shortness of breath ? Fever of 100?F or higher ? Black, tarry-looking stools ? ?For urgent or emergent issues, a  gastroenterologist can be reached at any hour by calling [phone]. ?Do not use MyChart messaging for urgent concerns.  ? ? ?DIET:  We do recommend a small meal at first, but then you may proceed to your regular diet.  Drink plenty of fluids but you should avoid alcoholic beverages for 24 hours. ? ?ACTIVITY:  You should plan to take it easy for the rest of today and you should NOT DRIVE or use heavy machinery until tomorrow (because of the sedation medicines used during the test).   ? ?FOLLOW UP: ?Our staff will call the number listed on your records 48-72 hours following your procedure to check on you and address any questions or concerns that you may have regarding the information given to you following your procedure. If we do not reach you, we will leave a message.  We will attempt to reach you two times.  During this call, we will ask if you have developed any symptoms of COVID 19. If you develop any symptoms (ie: fever, flu-like symptoms, shortness of breath, cough etc.) before then, please call [phone].  If you test positive for Covid 19 in the 2 weeks post procedure, please call and report this information to [place].   ? ?If any biopsies were taken you will be contacted by phone or by letter within the next 1-3 weeks.  Please call us at [phone] if you have not heard about the biopsies in 3 weeks.  ? ? ?SIGNATURES/CONFIDENTIALITY: ?You and/or your care partner have signed paperwork  which will be entered into your electronic medical record.  These signatures attest to the fact that that the information above on your After Visit Summary has been reviewed and is understood.  Full responsibility of the confidentiality of this discharge information lies with you and/or your care-partner.  ?

## 2021-07-30 NOTE — Progress Notes (Signed)
Report to PACU, RN, vss, BBS= Clear.  

## 2021-07-30 NOTE — Op Note (Signed)
[place] ?Patient Name: Sierra Sparks ?Procedure Date: 07/30/2021 3:24 PM ?MRN: [id number] ?Endoscopist: Docia Chuck. Henrene Pastor , MD ?Age: 81 ?Referring MD:  ?Date of Birth: 1941-01-12 ?Gender: Female ?Account #: [id number] ?Procedure:                Upper GI endoscopy with biopsies ?Indications:              Iron deficiency anemia ?Medicines:                Monitored Anesthesia Care ?Procedure:                Pre-Anesthesia Assessment: ?                          - Prior to the procedure, a History and Physical  ?                          was performed, and patient medications and  ?                          allergies were reviewed. The patient's tolerance of  ?                          previous anesthesia was also reviewed. The risks  ?                          and benefits of the procedure and the sedation  ?                          options and risks were discussed with the patient.  ?                          All questions were answered, and informed consent  ?                          was obtained. Prior Anticoagulants: The patient has  ?                          taken no previous anticoagulant or antiplatelet  ?                          agents. ASA Grade Assessment: II - A patient with  ?                          mild systemic disease. After reviewing the risks  ?                          and benefits, the patient was deemed in  ?                          satisfactory condition to undergo the procedure. ?                          After obtaining informed consent, the endoscope was  ?  passed under direct vision. Throughout the  ?                          procedure, the patient's blood pressure, pulse, and  ?                          oxygen saturations were monitored continuously. The  ?                          Endoscope was introduced through the mouth, and  ?                          advanced to the second part of duodenum. The upper  ?                          GI endoscopy was  accomplished without difficulty.  ?                          The patient tolerated the procedure well. ?Scope In: ?Scope Out: ?Findings:                 The esophagus revealed a large caliber distal  ?                          stricture at the gastroesophageal junction (35 cm).  ?                          Otherwise normal esophagus. ?                          The stomach revealed a 5 cm hiatal hernia with  ?                          Cameron erosions. No other abnormalities.. ?                          The examined duodenum was normal. Biopsies for  ?                          histology were taken with a cold forceps for  ?                          evaluation of celiac disease. ?                          The cardia and gastric fundus were normal on  ?                          retroflexion, save hiatal hernia. ?Complications:            No immediate complications. ?Estimated Blood Loss:     Estimated blood loss: none. ?Impression:               1. Hiatal hernia with Cameron erosions. This  ?  explains deficiency anemia ?                          2. Incidental esophageal stricture. Otherwise  ?                          normal EGD ?                          3. GERD. ?Recommendation:           1. CBC and ferritin level today ?                          2. Continue iron supplement twice daily ?                          3. Follow-up biopsies ?                          4. Office follow-up with Dr. Henrene Pastor in 6-8 weeks.Marland Kitchen ?Docia Chuck. Henrene Pastor, MD ?07/30/2021 4:22:38 PM ?This report has been signed electronically. ?

## 2021-07-31 LAB — CBC WITH DIFFERENTIAL/PLATELET
Basophils Absolute: 0 10*3/uL (ref 0.0–0.1)
Basophils Relative: 0.6 % (ref 0.0–3.0)
Eosinophils Absolute: 0 10*3/uL (ref 0.0–0.7)
Eosinophils Relative: 1.1 % (ref 0.0–5.0)
HCT: 35.7 % — ABNORMAL LOW (ref 36.0–46.0)
Hemoglobin: 11.3 g/dL — ABNORMAL LOW (ref 12.0–15.0)
Lymphocytes Relative: 39.4 % (ref 12.0–46.0)
Lymphs Abs: 1.7 10*3/uL (ref 0.7–4.0)
MCHC: 31.6 g/dL (ref 30.0–36.0)
MCV: 76.7 fl — ABNORMAL LOW (ref 78.0–100.0)
Monocytes Absolute: 0.6 10*3/uL (ref 0.1–1.0)
Monocytes Relative: 13.9 % — ABNORMAL HIGH (ref 3.0–12.0)
Neutro Abs: 2 10*3/uL (ref 1.4–7.7)
Neutrophils Relative %: 45 % (ref 43.0–77.0)
Platelets: 306 10*3/uL (ref 150.0–400.0)
RBC: 4.66 Mil/uL (ref 3.87–5.11)
RDW: 25.1 % — ABNORMAL HIGH (ref 11.5–15.5)
WBC: 4.4 10*3/uL (ref 4.0–10.5)

## 2021-07-31 LAB — FERRITIN: Ferritin: 19.1 ng/mL (ref 10.0–291.0)

## 2021-08-01 ENCOUNTER — Telehealth: Payer: Self-pay | Admitting: *Deleted

## 2021-08-01 NOTE — Telephone Encounter (Signed)
?  Follow up Call- ? ? ?  07/30/2021  ?  2:35 PM  ?Call back number  ?Post procedure Call Back phone  # [phone]  ?Permission to leave phone message Yes  ?  ? ?Patient questions: ? ?Do you have a fever, pain , or abdominal swelling? No. ?Pain Score  0 * ? ?Have you tolerated food without any problems? Yes.   ? ?Have you been able to return to your normal activities? Yes.   ? ?Do you have any questions about your discharge instructions: ?Diet   No. ?Medications  No. ?Follow up visit  No. ? ?Do you have questions or concerns about your Care? No. ? ?Actions: ?* If pain score is 4 or above: ?No action needed, pain <4. ? ? ?

## 2021-08-02 ENCOUNTER — Encounter: Payer: Self-pay | Admitting: Internal Medicine

## 2021-08-11 ENCOUNTER — Other Ambulatory Visit: Payer: Self-pay | Admitting: Family Medicine

## 2021-08-17 DIAGNOSIS — M65342 Trigger finger, left ring finger: Secondary | ICD-10-CM | POA: Diagnosis not present

## 2021-08-17 DIAGNOSIS — M5432 Sciatica, left side: Secondary | ICD-10-CM | POA: Diagnosis not present

## 2021-08-21 ENCOUNTER — Other Ambulatory Visit: Payer: Self-pay | Admitting: Family Medicine

## 2021-08-31 ENCOUNTER — Telehealth: Payer: Self-pay | Admitting: Cardiovascular Disease

## 2021-08-31 NOTE — Telephone Encounter (Signed)
  Pt c/o medication issue:  1. Name of Medication: rosuvastatin (CRESTOR) 10 MG tablet  2. How are you currently taking this medication (dosage and times per day)? Take 1 tablet by mouth once daily  3. Are you having a reaction (difficulty breathing--STAT)?   4. What is your medication issue? Pt said, she was told by Dr. Rockey Situ to call once her leg cramps is gone so he can replace her rosuvastatin

## 2021-09-12 ENCOUNTER — Other Ambulatory Visit: Payer: Self-pay | Admitting: Family Medicine

## 2021-09-18 ENCOUNTER — Encounter: Payer: Self-pay | Admitting: Internal Medicine

## 2021-09-18 ENCOUNTER — Ambulatory Visit: Payer: Medicare PPO | Admitting: Internal Medicine

## 2021-09-18 ENCOUNTER — Other Ambulatory Visit (INDEPENDENT_AMBULATORY_CARE_PROVIDER_SITE_OTHER): Payer: Medicare PPO

## 2021-09-18 VITALS — BP 140/86 | HR 72 | Ht 61.0 in | Wt 168.4 lb

## 2021-09-18 DIAGNOSIS — Z86010 Personal history of colonic polyps: Secondary | ICD-10-CM

## 2021-09-18 DIAGNOSIS — D509 Iron deficiency anemia, unspecified: Secondary | ICD-10-CM | POA: Diagnosis not present

## 2021-09-18 DIAGNOSIS — K253 Acute gastric ulcer without hemorrhage or perforation: Secondary | ICD-10-CM | POA: Diagnosis not present

## 2021-09-18 DIAGNOSIS — K449 Diaphragmatic hernia without obstruction or gangrene: Secondary | ICD-10-CM | POA: Diagnosis not present

## 2021-09-18 LAB — CBC WITH DIFFERENTIAL/PLATELET
Basophils Absolute: 0 10*3/uL (ref 0.0–0.1)
Basophils Relative: 0.5 % (ref 0.0–3.0)
Eosinophils Absolute: 0 10*3/uL (ref 0.0–0.7)
Eosinophils Relative: 0.8 % (ref 0.0–5.0)
HCT: 37.7 % (ref 36.0–46.0)
Hemoglobin: 12.4 g/dL (ref 12.0–15.0)
Lymphocytes Relative: 36.6 % (ref 12.0–46.0)
Lymphs Abs: 1.7 10*3/uL (ref 0.7–4.0)
MCHC: 32.8 g/dL (ref 30.0–36.0)
MCV: 87.3 fl (ref 78.0–100.0)
Monocytes Absolute: 0.7 10*3/uL (ref 0.1–1.0)
Monocytes Relative: 15 % — ABNORMAL HIGH (ref 3.0–12.0)
Neutro Abs: 2.2 10*3/uL (ref 1.4–7.7)
Neutrophils Relative %: 47.1 % (ref 43.0–77.0)
Platelets: 263 10*3/uL (ref 150.0–400.0)
RBC: 4.32 Mil/uL (ref 3.87–5.11)
RDW: 23.4 % — ABNORMAL HIGH (ref 11.5–15.5)
WBC: 4.6 10*3/uL (ref 4.0–10.5)

## 2021-09-18 LAB — FERRITIN: Ferritin: 13.8 ng/mL (ref 10.0–291.0)

## 2021-09-18 NOTE — Patient Instructions (Signed)
If you are age 79 or older, your body mass index should be between 23-30. Your Body mass index is 31.81 kg/m. If this is out of the aforementioned range listed, please consider follow up with your Primary Care Provider.  If you are age 1 or younger, your body mass index should be between 19-25. Your Body mass index is 31.81 kg/m. If this is out of the aformentioned range listed, please consider follow up with your Primary Care Provider.   ________________________________________________________  The [facility] providers would like to encourage you to use [facility] to communicate with providers for non-urgent requests or questions.  Due to long hold times on the telephone, sending your provider a message by [facility] may be a faster and more efficient way to get a response.  Please allow 48 business hours for a response.  Please remember that this is for non-urgent requests.  _______________________________________________________  Your provider has requested that you go to the basement level for lab work before leaving today. Press "B" on the elevator. The lab is located at the first door on the left as you exit the elevator.

## 2021-09-18 NOTE — Progress Notes (Signed)
HISTORY OF PRESENT ILLNESS:  Sierra Sparks is a 81 y.o. female who presents today for follow-up regarding iron deficiency anemia.  She underwent colonoscopy and upper endoscopy to evaluate this problem on July 30, 2021.  Colonoscopy revealed diverticulosis, internal hemorrhoids, and a diminutive hyperplastic polyp was removed.  The ileum was normal.  Upper endoscopy revealed a large caliber distal esophageal ring and a 5 cm hiatal hernia with associated Cameron erosions.  Cameron erosions were found to be the cause for her iron deficiency anemia.  She was continued on twice daily iron supplement.  Blood work from that day was improved with hemoglobin of 11.3 and ferritin level 19.1.  She continues on his iron supplementation.  This has caused her stools to turn dark.  Also some constipation for which she uses stool softeners successfully.  Otherwise, no GI complaints.  REVIEW OF SYSTEMS:  All non-GI ROS negative unless otherwise stated in the HPI except for arthritis, fatigue, hearing problems, urinary leakage  Past Medical History:  Diagnosis Date   Allergic rhinitis, cause unspecified    Allergy    Anemia    Anxiety state, unspecified    Arthritis    Cataract    forming    Colon polyps    Depressive disorder, not elsewhere classified    Diffuse cystic mastopathy    Disorder of bone and cartilage, unspecified    Diverticulosis    External hemorrhoids    Gallstones    GERD (gastroesophageal reflux disease)    Glaucoma    narrow angle thatwas corrected with laser surgery    Osteoarthrosis, unspecified whether generalized or localized, unspecified site    hands   Osteoporosis    Pure hypercholesterolemia    Thyroid disease    years past- no meds currently    Unspecified essential hypertension    Unspecified hemorrhoids without mention of complication     Past Surgical History:  Procedure Laterality Date   BREAST BIOPSY Left 08/11/2020   11:00 3cmfn Q clip u/s bx path  pending   BREAST EXCISIONAL BIOPSY Left 08/25/2020   surgical exc papilloma with tag placement tag no.[id number]   BREAST LUMPECTOMY WITH RADIO FREQUENCY LOCALIZER Left 08/25/2020   Procedure: BREAST LUMPECTOMY WITH RADIO FREQUENCY LOCALIZER;  Surgeon: Benjamine Sprague, DO;  Location: [facility] ORS;  Service: General;  Laterality: Left;   BUNIONECTOMY Left 10/1999   CESAREAN SECTION  1981   CHOLECYSTECTOMY     COLONOSCOPY  01/2002   polyp; hemorrhoids;diverticulosis   COLONOSCOPY  2013   diverticulosis   DEXA  06/2000   osteopenia   DEXA  04/2003   Stable   HAND SURGERY Left    DIP fusion-left   HEMORRHOID SURGERY     POLYPECTOMY     TONSILLECTOMY     childhood    Social History Sierra Sparks  reports that she has never smoked. She has never used smokeless tobacco. She reports current alcohol use. She reports that she does not use drugs.  family history includes Arthritis in her father; Bone cancer in her paternal uncle; Cancer in her brother and maternal grandmother; Colon cancer in her brother; Coronary artery disease (age of onset: 49) in her mother; Hyperlipidemia in her mother; Hypertension in her mother and sister; Liver cancer in her brother; Lung cancer in her father; Migraines in her sister; Stroke in her maternal grandmother and mother.  No Known Allergies     PHYSICAL EXAMINATION: Vital signs: BP 140/86   Pulse 72   Ht  $'5\' 1"'O$  (1.549 m)   Wt 168 lb 6 oz (76.4 kg)   SpO2 95%   BMI 31.81 kg/m   Constitutional: generally well-appearing, no acute distress Psychiatric: alert and oriented x3, cooperative Eyes: extraocular movements intact, anicteric, conjunctiva pink Mouth: oral pharynx moist, no lesions Neck: supple no lymphadenopathy Cardiovascular: heart regular rate and rhythm, no murmur Lungs: clear to auscultation bilaterally Abdomen: soft, nontender, nondistended, no obvious ascites, no peritoneal signs, normal bowel sounds, no organomegaly Rectal:  Limited Extremities: no clubbing, cyanosis, or lower extremity edema bilaterally Skin: no lesions on visible extremities Neuro: No focal deficits.  Cranial nerves intact  ASSESSMENT:  1.  Iron deficiency anemia secondary to [facility] erosions.  Recent upper endoscopy as described 2.  Distal esophageal ring 3.  History of adenomatous colon polyps.  Recent colonoscopy as described.  Aged out of surveillance 4.  General medical problems.  Stable  PLAN:  1.  Long discussion today on having hernia associated erosions and their association with iron deficiency anemia. 2.  Continue iron twice daily.  Will need iron daily indefinitely.  She understands. 3.  Repeat CBC and ferritin today.  We will contact the patient when the results are available 4.  Resume general medical care with PCP A total time of 30 minutes was spent preparing to see the patient, obtaining history, performing medically appropriate physical examination, counseling and educating the patient regarding the above listed issues, directing medical therapy, ordering laboratories, and documenting clinical information in the health record

## 2021-10-18 DIAGNOSIS — L820 Inflamed seborrheic keratosis: Secondary | ICD-10-CM | POA: Diagnosis not present

## 2021-10-18 DIAGNOSIS — K116 Mucocele of salivary gland: Secondary | ICD-10-CM | POA: Diagnosis not present

## 2021-10-18 DIAGNOSIS — L988 Other specified disorders of the skin and subcutaneous tissue: Secondary | ICD-10-CM | POA: Diagnosis not present

## 2021-10-18 DIAGNOSIS — L298 Other pruritus: Secondary | ICD-10-CM | POA: Diagnosis not present

## 2021-10-18 DIAGNOSIS — R208 Other disturbances of skin sensation: Secondary | ICD-10-CM | POA: Diagnosis not present

## 2021-10-18 DIAGNOSIS — Z789 Other specified health status: Secondary | ICD-10-CM | POA: Diagnosis not present

## 2021-11-01 DIAGNOSIS — H40013 Open angle with borderline findings, low risk, bilateral: Secondary | ICD-10-CM | POA: Diagnosis not present

## 2021-11-01 DIAGNOSIS — H40033 Anatomical narrow angle, bilateral: Secondary | ICD-10-CM | POA: Diagnosis not present

## 2021-11-01 DIAGNOSIS — H04561 Stenosis of right lacrimal punctum: Secondary | ICD-10-CM | POA: Diagnosis not present

## 2021-11-01 DIAGNOSIS — H2513 Age-related nuclear cataract, bilateral: Secondary | ICD-10-CM | POA: Diagnosis not present

## 2021-11-01 DIAGNOSIS — H35373 Puckering of macula, bilateral: Secondary | ICD-10-CM | POA: Diagnosis not present

## 2021-11-30 ENCOUNTER — Other Ambulatory Visit: Payer: Self-pay | Admitting: Family Medicine

## 2021-12-03 ENCOUNTER — Encounter: Payer: Self-pay | Admitting: Family Medicine

## 2021-12-03 ENCOUNTER — Ambulatory Visit: Payer: Medicare PPO | Admitting: Family Medicine

## 2021-12-03 VITALS — BP 126/80 | HR 85 | Ht 61.0 in | Wt 173.8 lb

## 2021-12-03 DIAGNOSIS — E7800 Pure hypercholesterolemia, unspecified: Secondary | ICD-10-CM

## 2021-12-03 DIAGNOSIS — D509 Iron deficiency anemia, unspecified: Secondary | ICD-10-CM

## 2021-12-03 DIAGNOSIS — M8589 Other specified disorders of bone density and structure, multiple sites: Secondary | ICD-10-CM | POA: Diagnosis not present

## 2021-12-03 DIAGNOSIS — I499 Cardiac arrhythmia, unspecified: Secondary | ICD-10-CM | POA: Diagnosis not present

## 2021-12-03 DIAGNOSIS — I10 Essential (primary) hypertension: Secondary | ICD-10-CM

## 2021-12-03 MED ORDER — ROSUVASTATIN CALCIUM 10 MG PO TABS: 5.0000 mg | ORAL_TABLET | Freq: Every day | ORAL | 0 refills | Status: DC

## 2021-12-03 MED ORDER — EZETIMIBE 10 MG PO TABS
10.0000 mg | ORAL_TABLET | Freq: Every day | ORAL | 3 refills | Status: DC
Start: 2021-12-03 — End: 2023-01-20

## 2021-12-03 NOTE — Progress Notes (Signed)
Subjective:    Patient ID: Sierra Sparks, female    DOB: July 24, 1940, 81 y.o.   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    HTN bp is stable today  No cp or palpitations or headaches or edema  No side effects to medicines  BP Readings from Last 3 Encounters:  12/03/21 126/80  09/18/21 140/86  07/30/21 (!) 148/89     Amlodipine 5 mg daily   Pulse Readings from Last 3 Encounters:  12/03/21 85  09/18/21 72  07/30/21 73     Saw cardiology for PACs and lipids   Taking iron bid for iron def anemia caused by Sierra Sparks erosions (hernia relate) Iron is back in nl range Feels much better Stool is black  Lab Results  Component Value Date   WBC 4.6 09/18/2021   HGB 12.4 09/18/2021   HCT 37.7 09/18/2021   MCV 87.3 09/18/2021   PLT 263.0 09/18/2021   Lab Results  Component Value Date   IRON 60 06/14/2021   TIBC 502 (H) 06/14/2021   FERRITIN 13.8 09/18/2021   Cholesterol Statin caused leg pain - terrible cramps every am     Cardiology suggested crestor low dose or intermittent with zetia She never got the message    Lab Results  Component Value Date   CHOL 130 05/11/2021   HDL 49.10 05/11/2021   LDLCALC 60 05/11/2021   LDLDIRECT 143.2 08/05/2012   TRIG 101.0 05/11/2021   CHOLHDL 3 05/11/2021    Stopped fosamax due to GI erosions   Diet is not great  Not eating enough fruits and vegetables Loss of motivation   Patient Active Problem List   Diagnosis Date Noted   Vertigo 07/17/2021   Irregular heart beat 06/22/2021   Colon cancer screening 05/18/2021   Encounter for screening mammogram for breast cancer 03/31/2017   Hearing loss  in right ear 08/30/2016   Anal fissure 03/06/2016   Internal hemorrhoids 02/26/2016   Routine general medical examination at a health care facility 02/03/2015   Estrogen deficiency 02/03/2015   Encounter for Medicare annual wellness exam 08/12/2012   Glaucoma 08/12/2012   History of colon polyps 05/15/2011   Anemia 05/15/2011   ALLERGIC RHINITIS 03/02/2008   Osteopenia 01/05/2007   HYPERCHOLESTEROLEMIA 10/24/2006   Depression with anxiety 10/24/2006   Essential hypertension 10/24/2006   HEMORRHOIDS 10/24/2006   FIBROCYSTIC BREAST DISEASE 10/24/2006   OSTEOARTHRITIS 10/24/2006   Past Medical History:  Diagnosis Date   Allergic rhinitis, cause unspecified    Allergy    Anemia    Anxiety state, unspecified    Arthritis    Cataract    forming    Colon polyps    Depressive disorder, not elsewhere classified    Diffuse cystic mastopathy    Disorder of bone and cartilage, unspecified    Diverticulosis    External hemorrhoids    Gallstones    GERD (gastroesophageal reflux disease)    Glaucoma    narrow angle thatwas corrected with laser surgery    Osteoarthrosis, unspecified whether generalized or localized, unspecified site  hands   Osteoporosis    Pure hypercholesterolemia    Thyroid disease    years past- no meds currently    Unspecified essential hypertension    Unspecified hemorrhoids without mention of complication    Past Surgical History:  Procedure Laterality Date   BREAST BIOPSY Left 08/11/2020   11:00 3cmfn Q clip u/s bx path pending   BREAST EXCISIONAL BIOPSY Left 08/25/2020   surgical exc papilloma with tag placement tag [id number]   BREAST LUMPECTOMY WITH RADIO FREQUENCY LOCALIZER Left 08/25/2020   Procedure: BREAST LUMPECTOMY WITH RADIO FREQUENCY LOCALIZER;  Surgeon: Benjamine Sprague, DO;  Location: [facility] ORS;  Service: General;  Laterality: Left;   BUNIONECTOMY Left 10/1999   CESAREAN SECTION  1981   CHOLECYSTECTOMY     COLONOSCOPY  01/2002   polyp;  hemorrhoids;diverticulosis   COLONOSCOPY  2013   diverticulosis   DEXA  06/2000   osteopenia   DEXA  04/2003   Stable   HAND SURGERY Left    DIP fusion-left   HEMORRHOID SURGERY     POLYPECTOMY     TONSILLECTOMY     childhood   Social History   Tobacco Use   Smoking status: Never   Smokeless tobacco: Never  Vaping Use   Vaping Use: Never used  Substance Use Topics   Alcohol use: Yes    Comment: Occasional-wine, 3 per week   Drug use: No   Family History  Problem Relation Age of Onset   Stroke Mother    Coronary artery disease Mother 32   Hypertension Mother    Hyperlipidemia Mother    Arthritis Father    Lung cancer Father        + smoker   Hypertension Sister    Migraines Sister    Colon cancer Brother        ? primary colon   Liver cancer Brother    Cancer Brother        type unknown   Stroke Maternal Grandmother        hemorrhagic   Cancer Maternal Grandmother        female cancer type unknown   Bone cancer Paternal Uncle    Esophageal cancer Neg Hx    Rectal cancer Neg Hx    Stomach cancer Neg Hx    Breast cancer Neg Hx    Colon polyps Neg Hx    No Known Allergies Current Outpatient Medications on File Prior to Visit  Medication Sig Dispense Refill   amLODipine (NORVASC) 5 MG tablet Take 1 tablet by mouth once daily 90 tablet 0   Ascorbic Acid (VITAMIN C) 500 MG CAPS Take 500 mg by mouth daily.     Calcium Carbonate-Vit D-Min (CALCIUM 1200) 1200-1000 MG-UNIT CHEW Chew 600 mg by mouth 2 (two) times daily.     FLUoxetine (PROZAC) 20 MG capsule Take 1 capsule by mouth once daily 90 capsule 1   iron polysaccharides (FERREX 150) 150 MG capsule Take 1 capsule by mouth twice daily 60 capsule 3   Melatonin 10 MG TABS Take 10 mg by mouth daily.     Multiple Vitamin (MULTIVITAMIN) tablet Take 1 tablet by mouth daily.     naproxen sodium (ALEVE) 220 MG tablet Take 220 mg by mouth daily.     No current facility-administered medications on file prior to visit.      Review of Systems  Constitutional:  Negative for activity change, appetite change, fatigue, fever and unexpected weight change.  HENT:  Negative for  congestion, ear pain, rhinorrhea, sinus pressure and sore throat.   Eyes:  Negative for pain, redness and visual disturbance.  Respiratory:  Negative for cough, shortness of breath and wheezing.   Cardiovascular:  Negative for chest pain and palpitations.  Gastrointestinal:  Negative for abdominal pain, blood in stool, constipation and diarrhea.  Endocrine: Negative for polydipsia and polyuria.  Genitourinary:  Negative for dysuria, frequency and urgency.  Musculoskeletal:  Negative for arthralgias, back pain and myalgias.  Skin:  Negative for pallor and rash.  Allergic/Immunologic: Negative for environmental allergies.  Neurological:  Negative for dizziness, syncope and headaches.  Hematological:  Negative for adenopathy. Does not bruise/bleed easily.  Psychiatric/Behavioral:  Negative for decreased concentration and dysphoric mood. The patient is not nervous/anxious.        Some caregiver stress-husband        Objective:   Physical Exam Constitutional:      General: She is not in acute distress.    Appearance: Normal appearance. She is well-developed. She is obese. She is not ill-appearing or diaphoretic.  HENT:     Head: Normocephalic and atraumatic.  Eyes:     Conjunctiva/sclera: Conjunctivae normal.     Pupils: Pupils are equal, round, and reactive to light.  Neck:     Thyroid: No thyromegaly.     Vascular: No carotid bruit or JVD.  Cardiovascular:     Rate and Rhythm: Normal rate and regular rhythm.     Heart sounds: Normal heart sounds.     No gallop.  Pulmonary:     Effort: Pulmonary effort is normal. No respiratory distress.     Breath sounds: Normal breath sounds. No wheezing or rales.  Abdominal:     General: There is no distension or abdominal bruit.     Palpations: Abdomen is soft.  Musculoskeletal:      Cervical back: Normal range of motion and neck supple.     Right lower leg: No edema.     Left lower leg: No edema.  Lymphadenopathy:     Cervical: No cervical adenopathy.  Skin:    General: Skin is warm and dry.     Coloration: Skin is not pale.     Findings: No rash.  Neurological:     Mental Status: She is alert.     Coordination: Coordination normal.     Deep Tendon Reflexes: Reflexes are normal and symmetric. Reflexes normal.  Psychiatric:        Mood and Affect: Mood normal.           Assessment & Plan:   Problem List Items Addressed This Visit       Cardiovascular and Mediastinum   Essential hypertension    bp in fair control at this time  BP Readings from Last 1 Encounters:  12/03/21 126/80  No changes needed Most recent labs reviewed  Disc lifstyle change with low sodium diet and exercise  Plan to continue amlodipine 5 mg daily  Nl pulse  Noted PACs in past-has seen cardiology       Relevant Medications   rosuvastatin (CRESTOR) 10 MG tablet   ezetimibe (ZETIA) 10 MG tablet     Musculoskeletal and Integument   Osteopenia    Last EGD noted Cameron erosions  Has stopped alendronate         Other   Anemia    From Cameron erosions in assoc with Sierra Sparks She is doing better on bid niferex  Nl cbc with iron level of 60 last time  in spring       HYPERCHOLESTEROLEMIA - Primary    Was well controlled with crestor 10 and LDL of 60  Does not tolerate , leg cramps   Disc goals for lipids and reasons to control them Rev last labs with pt Rev low sat fat diet in detail Disc options for tx   Will cut dose of crestor to 5 mg  Try co enzyme q 10 otc Add zetia 10 mg daily   If still not tolerated will change statin to twice weekly   Lab planned 4-6 wk  Diet discussed and will work on that as well      Relevant Medications   rosuvastatin (CRESTOR) 10 MG tablet   ezetimibe (ZETIA) 10 MG tablet   Irregular heart beat    PACs found by cardiology   Reassuring  No complaints

## 2021-12-03 NOTE — Patient Instructions (Addendum)
Co enzyme Q10 over the counter can help you tolerate statin medicines  Take as directed   Go back on crestor at 5 mg daily (split the 10 mg in 1/2  Start zetia at 10 mg daily   If leg pain comes back we will try dosing crestor 5 mg twice weekly    Plan fasting lab in 4-6 weeks   Take care of yourself  Add produce back- in small amounts  Stay active

## 2021-12-03 NOTE — Assessment & Plan Note (Signed)
Last EGD noted Sierra Sparks erosions  Has stopped alendronate

## 2021-12-03 NOTE — Assessment & Plan Note (Signed)
PACs found by cardiology  Reassuring  No complaints

## 2021-12-03 NOTE — Assessment & Plan Note (Signed)
Was well controlled with crestor 10 and LDL of 60  Does not tolerate , leg cramps   Disc goals for lipids and reasons to control them Rev last labs with pt Rev low sat fat diet in detail Disc options for tx   Will cut dose of crestor to 5 mg  Try co enzyme q 10 otc Add zetia 10 mg daily   If still not tolerated will change statin to twice weekly   Lab planned 4-6 wk  Diet discussed and will work on that as well

## 2021-12-03 NOTE — Assessment & Plan Note (Signed)
bp in fair control at this time  BP Readings from Last 1 Encounters:  12/03/21 126/80   No changes needed Most recent labs reviewed  Disc lifstyle change with low sodium diet and exercise  Plan to continue amlodipine 5 mg daily  Nl pulse  Noted PACs in past-has seen cardiology

## 2021-12-03 NOTE — Assessment & Plan Note (Signed)
From Cameron erosions in assoc with [facility] She is doing better on bid niferex  Nl cbc with iron level of 60 last time in spring

## 2021-12-29 ENCOUNTER — Telehealth: Payer: Self-pay | Admitting: Family Medicine

## 2021-12-29 DIAGNOSIS — E7800 Pure hypercholesterolemia, unspecified: Secondary | ICD-10-CM

## 2021-12-29 NOTE — Telephone Encounter (Signed)
-----   Message from Ellamae Sia sent at 12/25/2021  3:58 PM EDT ----- Regarding: Lab orders for Tuesday, 9.26.23 Lab orders for lipid?, thanks

## 2022-01-08 ENCOUNTER — Other Ambulatory Visit (INDEPENDENT_AMBULATORY_CARE_PROVIDER_SITE_OTHER): Payer: Medicare PPO

## 2022-01-08 DIAGNOSIS — E7800 Pure hypercholesterolemia, unspecified: Secondary | ICD-10-CM

## 2022-01-08 LAB — LIPID PANEL
Cholesterol: 150 mg/dL (ref 0–200)
HDL: 47 mg/dL (ref >39.00)
LDL Cholesterol: 72 mg/dL (ref 0–99)
NonHDL: 102.92
Total CHOL/HDL Ratio: 3
Triglycerides: 154 mg/dL — ABNORMAL HIGH (ref 0.0–149.0)
VLDL: 30.8 mg/dL (ref 0.0–40.0)

## 2022-01-09 ENCOUNTER — Encounter: Payer: Self-pay | Admitting: *Deleted

## 2022-01-22 DIAGNOSIS — M1612 Unilateral primary osteoarthritis, left hip: Secondary | ICD-10-CM | POA: Diagnosis not present

## 2022-01-23 DIAGNOSIS — R42 Dizziness and giddiness: Secondary | ICD-10-CM | POA: Diagnosis not present

## 2022-01-23 DIAGNOSIS — J019 Acute sinusitis, unspecified: Secondary | ICD-10-CM | POA: Diagnosis not present

## 2022-01-23 DIAGNOSIS — B9689 Other specified bacterial agents as the cause of diseases classified elsewhere: Secondary | ICD-10-CM | POA: Diagnosis not present

## 2022-01-29 ENCOUNTER — Other Ambulatory Visit: Payer: Self-pay | Admitting: Family Medicine

## 2022-02-04 DIAGNOSIS — M25552 Pain in left hip: Secondary | ICD-10-CM | POA: Diagnosis not present

## 2022-02-07 ENCOUNTER — Telehealth (HOSPITAL_COMMUNITY): Payer: Self-pay | Admitting: Orthopedic Surgery

## 2022-02-07 MED ORDER — CELECOXIB 100 MG PO CAPS: 100.0000 mg | ORAL_CAPSULE | Freq: Two times a day (BID) | ORAL | 0 refills | Status: AC

## 2022-02-07 NOTE — Telephone Encounter (Signed)
L hip MRI reviewed with patient. Mild OA otherwise unremarkable. Celebrex sent to pharmacy.

## 2022-02-12 DIAGNOSIS — M25552 Pain in left hip: Secondary | ICD-10-CM | POA: Diagnosis not present

## 2022-02-25 ENCOUNTER — Other Ambulatory Visit: Payer: Self-pay

## 2022-02-25 ENCOUNTER — Telehealth: Payer: Self-pay | Admitting: Family Medicine

## 2022-02-25 MED ORDER — ROSUVASTATIN CALCIUM 5 MG PO TABS: 5.0000 mg | ORAL_TABLET | Freq: Every day | ORAL | 3 refills | Status: DC

## 2022-02-25 NOTE — Telephone Encounter (Signed)
  Encourage patient to contact the pharmacy for refills or they can request refills through [facility] Pt came in office requesting a refill on RX for  '5mg'$  please advise  stated PCP has change it . Please advise    LAST APPOINTMENT DATE:  Please schedule appointment if longer than 1 year  NEXT APPOINTMENT DATE:  MEDICATION:rosuvastatin (CRESTOR) 10 MG tablet   Is the patient out of medication?   PHARMACY: [facility] [place], [place] -     Let patient know to contact pharmacy at the end of the day to make sure medication is ready.  Please notify patient to allow 48-72 hours to process  CLINICAL FILLS OUT ALL BELOW:   LAST REFILL:  QTY:  REFILL DATE:    OTHER COMMENTS:    Okay for refill?  Please advise

## 2022-02-25 NOTE — Telephone Encounter (Signed)
Crestor 5 mg have been sent to pts preferred pharmacy and the 10 mg have been d/c. Pt has been notified.

## 2022-03-21 ENCOUNTER — Other Ambulatory Visit: Payer: Self-pay | Admitting: Family Medicine

## 2022-03-25 ENCOUNTER — Other Ambulatory Visit: Payer: Self-pay | Admitting: Family Medicine

## 2022-04-15 DIAGNOSIS — R7303 Prediabetes: Secondary | ICD-10-CM

## 2022-04-15 HISTORY — DX: Prediabetes: R73.03

## 2022-05-06 DIAGNOSIS — H40013 Open angle with borderline findings, low risk, bilateral: Secondary | ICD-10-CM | POA: Diagnosis not present

## 2022-05-06 DIAGNOSIS — H5202 Hypermetropia, left eye: Secondary | ICD-10-CM | POA: Diagnosis not present

## 2022-05-06 DIAGNOSIS — H5211 Myopia, right eye: Secondary | ICD-10-CM | POA: Diagnosis not present

## 2022-05-06 DIAGNOSIS — H2513 Age-related nuclear cataract, bilateral: Secondary | ICD-10-CM | POA: Diagnosis not present

## 2022-05-06 DIAGNOSIS — H52221 Regular astigmatism, right eye: Secondary | ICD-10-CM | POA: Diagnosis not present

## 2022-05-06 DIAGNOSIS — H524 Presbyopia: Secondary | ICD-10-CM | POA: Diagnosis not present

## 2022-05-06 DIAGNOSIS — H35373 Puckering of macula, bilateral: Secondary | ICD-10-CM | POA: Diagnosis not present

## 2022-05-06 DIAGNOSIS — H40033 Anatomical narrow angle, bilateral: Secondary | ICD-10-CM | POA: Diagnosis not present

## 2022-05-06 DIAGNOSIS — H04561 Stenosis of right lacrimal punctum: Secondary | ICD-10-CM | POA: Diagnosis not present

## 2022-05-08 ENCOUNTER — Encounter: Payer: Self-pay | Admitting: Family Medicine

## 2022-05-08 ENCOUNTER — Ambulatory Visit: Payer: Medicare PPO | Admitting: Family Medicine

## 2022-05-08 VITALS — BP 136/88 | HR 83 | Temp 97.9°F | Ht 61.0 in | Wt 176.0 lb

## 2022-05-08 DIAGNOSIS — R059 Cough, unspecified: Secondary | ICD-10-CM

## 2022-05-08 DIAGNOSIS — U071 COVID-19: Secondary | ICD-10-CM | POA: Diagnosis not present

## 2022-05-08 LAB — POCT INFLUENZA A/B
Influenza A, POC: NEGATIVE
Influenza B, POC: NEGATIVE

## 2022-05-08 LAB — POC COVID19 BINAXNOW: SARS Coronavirus 2 Ag: POSITIVE — AB

## 2022-05-08 MED ORDER — BENZONATATE 200 MG PO CAPS: 200.0000 mg | ORAL_CAPSULE | Freq: Three times a day (TID) | ORAL | 1 refills | Status: DC | PRN

## 2022-05-08 MED ORDER — NIRMATRELVIR/RITONAVIR (PAXLOVID)TABLET: 3.0000 | ORAL_TABLET | Freq: Two times a day (BID) | ORAL | 0 refills | Status: AC

## 2022-05-08 NOTE — Progress Notes (Signed)
Subjective:    Patient ID: Sierra Sparks, female    DOB: 03-Dec-1940, 82 y.o.   MRN: [id number]  HPI Pt presents with cough/congestion and headache  Wt Readings from Last 3 Encounters:  05/08/22 176 lb (79.8 kg)  12/03/21 173 lb 12.8 oz (78.8 kg)  09/18/21 168 lb 6 oz (76.4 kg)   33.25 kg/m  Vitals:   05/08/22 1154  BP: 136/88  Pulse: 83  Temp: 97.9 F (36.6 C)  SpO2: 95%   Covid test is positive today   Started symptoms Saturday   Started with tickle in throat Then cough  Very sleepy  No headache  Has not had fever or chills or aches   Some cough : some phlegm - is clear  Some wheezing  No sob  Runny nose and stuffy   No n/v  No loss of taste or smell Ears feel full  Throat -not sore    Lab Results  Component Value Date   CREATININE 0.68 06/14/2021   BUN 20 06/14/2021   NA 138 06/14/2021   K 4.1 06/14/2021   CL 104 06/14/2021   CO2 28 06/14/2021   GFR 83.3  Otc Ny quil  Day quil   Drinking fluids   Results for orders placed or performed in visit on 05/08/22  POCT Influenza A/B  Result Value Ref Range   Influenza A, POC Negative Negative   Influenza B, POC Negative Negative  POC COVID-19  Result Value Ref Range   SARS Coronavirus 2 Ag Positive (A) Negative    Patient Active Problem List   Diagnosis Date Noted   COVID-19 05/08/2022   Vertigo 07/17/2021   Irregular heart beat 06/22/2021   Colon cancer screening 05/18/2021   Encounter for screening mammogram for breast cancer 03/31/2017   Hearing loss in right ear 08/30/2016   Anal fissure 03/06/2016   Internal hemorrhoids 02/26/2016   Routine general medical examination at a health care facility 02/03/2015   Estrogen deficiency 02/03/2015   Encounter for Medicare annual wellness exam 08/12/2012   Glaucoma 08/12/2012   History of colon polyps 05/15/2011   Anemia 05/15/2011   ALLERGIC RHINITIS 03/02/2008   Osteopenia 01/05/2007   HYPERCHOLESTEROLEMIA 10/24/2006   Depression  with anxiety 10/24/2006   Essential hypertension 10/24/2006   HEMORRHOIDS 10/24/2006   FIBROCYSTIC BREAST DISEASE 10/24/2006   OSTEOARTHRITIS 10/24/2006   Past Medical History:  Diagnosis Date   Allergic rhinitis, cause unspecified    Allergy    Anemia    Anxiety state, unspecified    Arthritis    Cataract    forming    Colon polyps    Depressive disorder, not elsewhere classified    Diffuse cystic mastopathy    Disorder of bone and cartilage, unspecified    Diverticulosis    External hemorrhoids    Gallstones    GERD (gastroesophageal reflux disease)    Glaucoma    narrow angle thatwas corrected with laser surgery    Osteoarthrosis, unspecified whether generalized or localized, unspecified site    hands   Osteoporosis    Pure hypercholesterolemia    Thyroid disease    years past- no meds currently    Unspecified essential hypertension    Unspecified hemorrhoids without mention of complication    Past Surgical History:  Procedure Laterality Date   BREAST BIOPSY Left 08/11/2020   11:00 3cmfn Q clip u/s bx path pending   BREAST EXCISIONAL BIOPSY Left 08/25/2020   surgical exc papilloma with tag placement tag  [id number]   BREAST LUMPECTOMY WITH RADIO FREQUENCY LOCALIZER Left 08/25/2020   Procedure: BREAST LUMPECTOMY WITH RADIO FREQUENCY LOCALIZER;  Surgeon: Benjamine Sprague, DO;  Location: [facility] ORS;  Service: General;  Laterality: Left;   BUNIONECTOMY Left 10/1999   CESAREAN SECTION  1981   CHOLECYSTECTOMY     COLONOSCOPY  01/2002   polyp; hemorrhoids;diverticulosis   COLONOSCOPY  2013   diverticulosis   DEXA  06/2000   osteopenia   DEXA  04/2003   Stable   HAND SURGERY Left    DIP fusion-left   HEMORRHOID SURGERY     POLYPECTOMY     TONSILLECTOMY     childhood   Social History   Tobacco Use   Smoking status: Never   Smokeless tobacco: Never  Vaping Use   Vaping Use: Never used  Substance Use Topics   Alcohol use: Yes    Comment: Occasional-wine, 3 per week    Drug use: No   Family History  Problem Relation Age of Onset   Stroke Mother    Coronary artery disease Mother 108   Hypertension Mother    Hyperlipidemia Mother    Arthritis Father    Lung cancer Father        + smoker   Hypertension Sister    Migraines Sister    Colon cancer Brother        ? primary colon   Liver cancer Brother    Cancer Brother        type unknown   Stroke Maternal Grandmother        hemorrhagic   Cancer Maternal Grandmother        female cancer type unknown   Bone cancer Paternal Uncle    Esophageal cancer Neg Hx    Rectal cancer Neg Hx    Stomach cancer Neg Hx    Breast cancer Neg Hx    Colon polyps Neg Hx    No Known Allergies Current Outpatient Medications on File Prior to Visit  Medication Sig Dispense Refill   amLODipine (NORVASC) 5 MG tablet Take 1 tablet by mouth once daily 90 tablet 2   Ascorbic Acid (VITAMIN C) 500 MG CAPS Take 500 mg by mouth daily.     Calcium Carbonate-Vit D-Min (CALCIUM 1200) 1200-1000 MG-UNIT CHEW Chew 600 mg by mouth 2 (two) times daily.     ezetimibe (ZETIA) 10 MG tablet Take 1 tablet (10 mg total) by mouth daily. 90 tablet 3   FLUoxetine (PROZAC) 20 MG capsule Take 1 capsule by mouth once daily 90 capsule 1   iron polysaccharides (FERREX 150) 150 MG capsule Take 1 capsule by mouth twice daily 180 capsule 1   Melatonin 10 MG TABS Take 10 mg by mouth daily.     Multiple Vitamin (MULTIVITAMIN) tablet Take 1 tablet by mouth daily.     naproxen sodium (ALEVE) 220 MG tablet Take 220 mg by mouth daily.     rosuvastatin (CRESTOR) 5 MG tablet Take 1 tablet (5 mg total) by mouth daily. 90 tablet 3   No current facility-administered medications on file prior to visit.    Review of Systems  Constitutional:  Positive for appetite change and fatigue. Negative for fever.  HENT:  Positive for congestion, postnasal drip, rhinorrhea, sinus pressure, sneezing and sore throat. Negative for ear pain.   Eyes:  Negative for pain and  discharge.  Respiratory:  Positive for cough and wheezing. Negative for shortness of breath and stridor.   Cardiovascular:  Negative for chest  pain.  Gastrointestinal:  Negative for diarrhea, nausea and vomiting.  Genitourinary:  Negative for frequency, hematuria and urgency.  Musculoskeletal:  Negative for arthralgias and myalgias.  Skin:  Negative for rash.  Neurological:  Positive for headaches. Negative for dizziness, weakness and light-headedness.  Psychiatric/Behavioral:  Negative for confusion and dysphoric mood.        Objective:   Physical Exam Constitutional:      General: She is not in acute distress.    Appearance: Normal appearance. She is well-developed. She is obese. She is not ill-appearing, toxic-appearing or diaphoretic.  HENT:     Head: Normocephalic and atraumatic.     Comments: Nares are injected and congested    No facial tenderness    Right Ear: Tympanic membrane, ear canal and external ear normal.     Left Ear: Tympanic membrane, ear canal and external ear normal.     Nose: Congestion and rhinorrhea present.     Mouth/Throat:     Mouth: Mucous membranes are moist.     Pharynx: Oropharynx is clear. No oropharyngeal exudate or posterior oropharyngeal erythema.     Comments: Clear pnd  Eyes:     General:        Right eye: No discharge.        Left eye: No discharge.     Conjunctiva/sclera: Conjunctivae normal.     Pupils: Pupils are equal, round, and reactive to light.  Cardiovascular:     Rate and Rhythm: Normal rate.     Heart sounds: Normal heart sounds.  Pulmonary:     Effort: Pulmonary effort is normal. No respiratory distress.     Breath sounds: Normal breath sounds. No stridor. No wheezing, rhonchi or rales.     Comments: Good air exch No wheeze even with forced exp Chest:     Chest wall: No tenderness.  Musculoskeletal:     Cervical back: Normal range of motion and neck supple.  Lymphadenopathy:     Cervical: No cervical adenopathy.   Skin:    General: Skin is warm and dry.     Capillary Refill: Capillary refill takes less than 2 seconds.     Findings: No rash.  Neurological:     Mental Status: She is alert.     Cranial Nerves: No cranial nerve deficit.  Psychiatric:        Mood and Affect: Mood normal.           Assessment & Plan:   Problem List Items Addressed This Visit       Other   COVID-19    Mild to mod symptoms in 82 yo pt with HTN Disc anti viral -px paxlovid with handout/disc poss side eff Rev sympt control Tessalon perales sent  See AVS Disc protocol for isolation and masking Disc ER precautions  Update if not starting to improve in a week or if worsening   Handouts given        Relevant Medications   nirmatrelvir/ritonavir (PAXLOVID) 20 x 150 MG & 10 x '100MG'$  TABS   Other Visit Diagnoses     Cough, unspecified type    -  Primary   Relevant Orders   POCT Influenza A/B (Completed)   POC COVID-19 (Completed)

## 2022-05-08 NOTE — Patient Instructions (Addendum)
Drink fluids and rest  mucinex DM is good for cough and congestion  Nasal saline for congestion as needed  Tylenol for fever or pain or headache  Please alert [place] if symptoms worsen (if severe or short of breath please go to the ER)    Take the paxlovid (anti viral) as directed If any intolerable side effects hold it and let us know   Try tessalon pearles for cough if needed   Update if not starting to improve in a week or if worsening   Isolate until symptoms are better (5 days minimum) Then mask for an additional 10 days

## 2022-05-08 NOTE — Assessment & Plan Note (Signed)
Mild to mod symptoms in 82 yo pt with HTN Disc anti viral -px paxlovid with handout/disc poss side eff Rev sympt control Tessalon perales sent  See AVS Disc protocol for isolation and masking Disc ER precautions  Update if not starting to improve in a week or if worsening   Handouts given

## 2022-05-12 ENCOUNTER — Telehealth: Payer: Self-pay | Admitting: Family Medicine

## 2022-05-12 DIAGNOSIS — E7800 Pure hypercholesterolemia, unspecified: Secondary | ICD-10-CM

## 2022-05-12 DIAGNOSIS — D509 Iron deficiency anemia, unspecified: Secondary | ICD-10-CM

## 2022-05-12 DIAGNOSIS — I10 Essential (primary) hypertension: Secondary | ICD-10-CM

## 2022-05-12 NOTE — Telephone Encounter (Signed)
-----  Message from Velna Hatchet, RT sent at 04/29/2022  3:05 PM EST ----- Regarding: Mon 1/29 lab Patient is scheduled for cpx, please order future labs.  Thanks, Anda Kraft

## 2022-05-13 ENCOUNTER — Other Ambulatory Visit: Payer: Medicare PPO

## 2022-05-20 ENCOUNTER — Ambulatory Visit (INDEPENDENT_AMBULATORY_CARE_PROVIDER_SITE_OTHER): Payer: Medicare PPO

## 2022-05-20 ENCOUNTER — Ambulatory Visit (INDEPENDENT_AMBULATORY_CARE_PROVIDER_SITE_OTHER): Payer: Medicare PPO | Admitting: Family Medicine

## 2022-05-20 ENCOUNTER — Encounter: Payer: Self-pay | Admitting: Family Medicine

## 2022-05-20 VITALS — BP 135/85 | HR 82 | Temp 97.6°F | Ht 61.25 in | Wt 177.5 lb

## 2022-05-20 VITALS — Wt 176.0 lb

## 2022-05-20 DIAGNOSIS — F418 Other specified anxiety disorders: Secondary | ICD-10-CM | POA: Diagnosis not present

## 2022-05-20 DIAGNOSIS — E7800 Pure hypercholesterolemia, unspecified: Secondary | ICD-10-CM

## 2022-05-20 DIAGNOSIS — Z0000 Encounter for general adult medical examination without abnormal findings: Secondary | ICD-10-CM | POA: Diagnosis not present

## 2022-05-20 DIAGNOSIS — M8589 Other specified disorders of bone density and structure, multiple sites: Secondary | ICD-10-CM | POA: Diagnosis not present

## 2022-05-20 DIAGNOSIS — Z1211 Encounter for screening for malignant neoplasm of colon: Secondary | ICD-10-CM

## 2022-05-20 DIAGNOSIS — I10 Essential (primary) hypertension: Secondary | ICD-10-CM

## 2022-05-20 DIAGNOSIS — D509 Iron deficiency anemia, unspecified: Secondary | ICD-10-CM | POA: Diagnosis not present

## 2022-05-20 DIAGNOSIS — Z1231 Encounter for screening mammogram for malignant neoplasm of breast: Secondary | ICD-10-CM

## 2022-05-20 NOTE — Assessment & Plan Note (Signed)
Disc goals for lipids and reasons to control them Rev last labs with pt Rev low sat fat diet in detail   Lab ordered Taking crestor 5 mg daily  Zetia 10 mg daily

## 2022-05-20 NOTE — Assessment & Plan Note (Signed)
Doing well overall PHQ score of zero Some stressors Reviewed stressors/ coping techniques/symptoms/ support sources/ tx options and side effects in detail today  Enc good self care Will continue fluoextine 20 mg daily

## 2022-05-20 NOTE — Assessment & Plan Note (Signed)
Dexa reviewed 06/2021  Had to stop alendronate due to upper GI bleed, Lysbeth Galas erosions  Takes c and D Walking for exercise  Disc plan to add strength training

## 2022-05-20 NOTE — Patient Instructions (Signed)
Sierra Sparks , Thank you for taking time to come for your Medicare Wellness Visit. I appreciate your ongoing commitment to your health goals. Please review the following plan we discussed and let me know if I can assist you in the future.   These are the goals we discussed:  Goals      Increase physical activity     Starting 02/21/2016, I will continue to exercise for at least 60 min 4-5 days per week.      Increase physical activity     Starting 04/04/2017, I will continue to exercise for 60-90 minutes 3 days per week.      Patient Stated     Lose a little weight         This is a list of the screening recommended for you and due dates:  Health Maintenance  Topic Date Due   Hepatitis C Screening: USPSTF Recommendation to screen - Ages 69-79 yo.  Never done   Mammogram  07/25/2021   Flu Shot  11/13/2021   COVID-19 Vaccine (5 - 2023-24 season) 12/14/2021   Medicare Annual Wellness Visit  05/21/2023   DTaP/Tdap/Td vaccine (4 - Tdap) 02/20/2026   Pneumonia Vaccine  Completed   DEXA scan (bone density measurement)  Completed   Zoster (Shingles) Vaccine  Completed   HPV Vaccine  Aged Out   Colon Cancer Screening  Discontinued    Advanced directives: copies in chart   Conditions/risks identified: lose a little weight   Next appointment: Follow up in one year for your annual wellness visit    Preventive Care 36 Years and Older, Female Preventive care refers to lifestyle choices and visits with your health care provider that can promote health and wellness. What does preventive care include? A yearly physical exam. This is also called an annual well check. Dental exams once or twice a year. Routine eye exams. Ask your health care provider how often you should have your eyes checked. Personal lifestyle choices, including: Daily care of your teeth and gums. Regular physical activity. Eating a healthy diet. Avoiding tobacco and drug use. Limiting alcohol use. Practicing safe  sex. Taking low-dose aspirin every day. Taking vitamin and mineral supplements as recommended by your health care provider. What happens during an annual well check? The services and screenings done by your health care provider during your annual well check will depend on your age, overall health, lifestyle risk factors, and family history of disease. Counseling  Your health care provider may ask you questions about your: Alcohol use. Tobacco use. Drug use. Emotional well-being. Home and relationship well-being. Sexual activity. Eating habits. History of falls. Memory and ability to understand (cognition). Work and work Statistician. Reproductive health. Screening  You may have the following tests or measurements: Height, weight, and BMI. Blood pressure. Lipid and cholesterol levels. These may be checked every 5 years, or more frequently if you are over 31 years old. Skin check. Lung cancer screening. You may have this screening every year starting at age 20 if you have a 30-pack-year history of smoking and currently smoke or have quit within the past 15 years. Fecal occult blood test (FOBT) of the stool. You may have this test every year starting at age 24. Flexible sigmoidoscopy or colonoscopy. You may have a sigmoidoscopy every 5 years or a colonoscopy every 10 years starting at age 57. Hepatitis C blood test. Hepatitis B blood test. Sexually transmitted disease (STD) testing. Diabetes screening. This is done by checking your blood sugar (  glucose) after you have not eaten for a while (fasting). You may have this done every 1-3 years. Bone density scan. This is done to screen for osteoporosis. You may have this done starting at age 73. Mammogram. This may be done every 1-2 years. Talk to your health care provider about how often you should have regular mammograms. Talk with your health care provider about your test results, treatment options, and if necessary, the need for more  tests. Vaccines  Your health care provider may recommend certain vaccines, such as: Influenza vaccine. This is recommended every year. Tetanus, diphtheria, and acellular pertussis (Tdap, Td) vaccine. You may need a Td booster every 10 years. Zoster vaccine. You may need this after age 32. Pneumococcal 13-valent conjugate (PCV13) vaccine. One dose is recommended after age 57. Pneumococcal polysaccharide (PPSV23) vaccine. One dose is recommended after age 50. Talk to your health care provider about which screenings and vaccines you need and how often you need them. This information is not intended to replace advice given to you by your health care provider. Make sure you discuss any questions you have with your health care provider. Document Released: 04/28/2015 Document Revised: 12/20/2015 Document Reviewed: 01/31/2015 Elsevier Interactive Patient Education  2017 [place] Prevention in the Home Falls can cause injuries. They can happen to people of all ages. There are many things you can do to make your home safe and to help prevent falls. What can I do on the outside of my home? Regularly fix the edges of walkways and driveways and fix any cracks. Remove anything that might make you trip as you walk through a door, such as a raised step or threshold. Trim any bushes or trees on the path to your home. Use bright outdoor lighting. Clear any walking paths of anything that might make someone trip, such as rocks or tools. Regularly check to see if handrails are loose or broken. Make sure that both sides of any steps have handrails. Any raised decks and porches should have guardrails on the edges. Have any leaves, snow, or ice cleared regularly. Use sand or salt on walking paths during winter. Clean up any spills in your garage right away. This includes oil or grease spills. What can I do in the bathroom? Use night lights. Install grab bars by the toilet and in the tub and shower.  Do not use towel bars as grab bars. Use non-skid mats or decals in the tub or shower. If you need to sit down in the shower, use a plastic, non-slip stool. Keep the floor dry. Clean up any water that spills on the floor as soon as it happens. Remove soap buildup in the tub or shower regularly. Attach bath mats securely with double-sided non-slip rug tape. Do not have throw rugs and other things on the floor that can make you trip. What can I do in the bedroom? Use night lights. Make sure that you have a light by your bed that is easy to reach. Do not use any sheets or blankets that are too big for your bed. They should not hang down onto the floor. Have a firm chair that has side arms. You can use this for support while you get dressed. Do not have throw rugs and other things on the floor that can make you trip. What can I do in the kitchen? Clean up any spills right away. Avoid walking on wet floors. Keep items that you use a lot in easy-to-reach places.  If you need to reach something above you, use a strong step stool that has a grab bar. Keep electrical cords out of the way. Do not use floor polish or wax that makes floors slippery. If you must use wax, use non-skid floor wax. Do not have throw rugs and other things on the floor that can make you trip. What can I do with my stairs? Do not leave any items on the stairs. Make sure that there are handrails on both sides of the stairs and use them. Fix handrails that are broken or loose. Make sure that handrails are as long as the stairways. Check any carpeting to make sure that it is firmly attached to the stairs. Fix any carpet that is loose or worn. Avoid having throw rugs at the top or bottom of the stairs. If you do have throw rugs, attach them to the floor with carpet tape. Make sure that you have a light switch at the top of the stairs and the bottom of the stairs. If you do not have them, ask someone to add them for you. What else  can I do to help prevent falls? Wear shoes that: Do not have high heels. Have rubber bottoms. Are comfortable and fit you well. Are closed at the toe. Do not wear sandals. If you use a stepladder: Make sure that it is fully opened. Do not climb a closed stepladder. Make sure that both sides of the stepladder are locked into place. Ask someone to hold it for you, if possible. Clearly mark and make sure that you can see: Any grab bars or handrails. First and last steps. Where the edge of each step is. Use tools that help you move around (mobility aids) if they are needed. These include: Canes. Walkers. Scooters. Crutches. Turn on the lights when you go into a dark area. Replace any light bulbs as soon as they burn out. Set up your furniture so you have a clear path. Avoid moving your furniture around. If any of your floors are uneven, fix them. If there are any pets around you, be aware of where they are. Review your medicines with your doctor. Some medicines can make you feel dizzy. This can increase your chance of falling. Ask your doctor what other things that you can do to help prevent falls. This information is not intended to replace advice given to you by your health care provider. Make sure you discuss any questions you have with your health care provider. Document Released: 01/26/2009 Document Revised: 09/07/2015 Document Reviewed: 05/06/2014 Elsevier Interactive Patient Education  2017 [employer].

## 2022-05-20 NOTE — Patient Instructions (Addendum)
Keep walking  Think about adding some strength training  Chair yoga is a great option   Use care not to fall Pick your feet up more if you can  Don't multi task while you are walking   Stop up front to set up fasting labs when convenient    Call and schedule your mammogram at [facility] Please call the location of your choice from the menu below to schedule your Mammogram and/or Bone Density appointment.    [facility]                      Phone:  [phone], [place] [address]                                                             Services: Traditional and 3D Mammogram, [facility]                 Phone: [phone] [address], [place] [address]    Service: Bone Density ONLY   *this site does NOT perform mammograms  [facility]                        Phone:  [phone] [address], [place] [address]                                            Services:  3D Mammogram and [place] at [facility]   Phone:  [phone]   [place],  [address]                                            Services: 3D Mammogram and Bone Density  [facility])  Phone:  [phone]   [address] [place],  [address]                                              Services:  3D Mammogram and Bone Density

## 2022-05-20 NOTE — Assessment & Plan Note (Signed)
Mammogram ordered Pt will call [facility] to schedule this Had b9 bx in 2022

## 2022-05-20 NOTE — Progress Notes (Signed)
I connected with  Elijah Birk on 05/20/22 by a audio enabled telemedicine application and verified that I am speaking with the correct person using two identifiers.  Patient Location: Home  Provider Location: Office/Clinic  I discussed the limitations of evaluation and management by telemedicine. The patient expressed understanding and agreed to proceed.   Subjective:   TARRA PENCE is a 82 y.o. female who presents for Medicare Annual (Subsequent) preventive examination.  Review of Systems     Cardiac Risk Factors include: advanced age (>8mn, >>41women);dyslipidemia;obesity (BMI >30kg/m2);hypertension     Objective:    Today's Vitals   05/20/22 1111  Weight: 176 lb (79.8 kg)   Body mass index is 33.25 kg/m.     05/20/2022   11:13 AM 08/25/2020    8:54 AM 08/24/2020   12:06 PM 05/07/2017    7:43 AM 04/21/2017   10:27 AM 04/14/2017    6:18 PM 04/04/2017    2:19 PM  Advanced Directives  Does Patient Have a Medical Advance Directive? Yes Yes Yes Yes Yes No No  Type of AParamed[place]of [place]Living will [place]ia SpringsLiving will [place]onLiving will HM[place]Living will [place]Living will    Does patient want to make changes to medical advance directive? No - Patient declined No - Patient declined  No - Patient declined   Yes (MAU/Ambulatory/Procedural Areas - Information given)  Copy of HAdelinein Chart? Yes - validated most recent copy scanned in chart (See row information) No - copy requested No - copy requested No - copy requested       Current Medications (verified) Outpatient Encounter Medications as of 05/20/2022  Medication Sig   amLODipine (NORVASC) 5 MG tablet Take 1 tablet by mouth once daily   Ascorbic Acid (VITAMIN C) 500 MG CAPS Take 500 mg by mouth daily.   benzonatate (TESSALON) 200 MG capsule Take 1 capsule (200 mg total) by mouth 3  (three) times daily as needed for cough. Swallow whole   Calcium Carbonate-Vit D-Min (CALCIUM 1200) 1200-1000 MG-UNIT CHEW Chew 600 mg by mouth 2 (two) times daily.   ezetimibe (ZETIA) 10 MG tablet Take 1 tablet (10 mg total) by mouth daily.   FLUoxetine (PROZAC) 20 MG capsule Take 1 capsule by mouth once daily   iron polysaccharides (FERREX 150) 150 MG capsule Take 1 capsule by mouth twice daily   Melatonin 10 MG TABS Take 10 mg by mouth daily.   Multiple Vitamin (MULTIVITAMIN) tablet Take 1 tablet by mouth daily.   naproxen sodium (ALEVE) 220 MG tablet Take 220 mg by mouth daily.   rosuvastatin (CRESTOR) 5 MG tablet Take 1 tablet (5 mg total) by mouth daily.   No facility-administered encounter medications on file as of 05/20/2022.    Allergies (verified) Patient has no known allergies.   History: Past Medical History:  Diagnosis Date   Allergic rhinitis, cause unspecified    Allergy    Anemia    Anxiety state, unspecified    Arthritis    Cataract    forming    Colon polyps    Depressive disorder, not elsewhere classified    Diffuse cystic mastopathy    Disorder of bone and cartilage, unspecified    Diverticulosis    External hemorrhoids    Gallstones    GERD (gastroesophageal reflux disease)    Glaucoma    narrow angle thatwas corrected with laser surgery  Osteoarthrosis, unspecified whether generalized or localized, unspecified site    hands   Osteoporosis    Pure hypercholesterolemia    Thyroid disease    years past- no meds currently    Unspecified essential hypertension    Unspecified hemorrhoids without mention of complication    Past Surgical History:  Procedure Laterality Date   BREAST BIOPSY Left 08/11/2020   11:00 3cmfn Q clip u/s bx path pending   BREAST EXCISIONAL BIOPSY Left 08/25/2020   surgical exc papilloma with tag placement tag [id number]   BREAST LUMPECTOMY WITH RADIO FREQUENCY LOCALIZER Left 08/25/2020   Procedure: BREAST LUMPECTOMY WITH RADIO  FREQUENCY LOCALIZER;  Surgeon: Benjamine Sprague, DO;  Location: [facility] ORS;  Service: General;  Laterality: Left;   BUNIONECTOMY Left 10/1999   CESAREAN SECTION  1981   CHOLECYSTECTOMY     COLONOSCOPY  01/2002   polyp; hemorrhoids;diverticulosis   COLONOSCOPY  2013   diverticulosis   DEXA  06/2000   osteopenia   DEXA  04/2003   Stable   HAND SURGERY Left    DIP fusion-left   HEMORRHOID SURGERY     POLYPECTOMY     TONSILLECTOMY     childhood   Family History  Problem Relation Age of Onset   Stroke Mother    Coronary artery disease Mother 24   Hypertension Mother    Hyperlipidemia Mother    Arthritis Father    Lung cancer Father        + smoker   Hypertension Sister    Migraines Sister    Colon cancer Brother        ? primary colon   Liver cancer Brother    Cancer Brother        type unknown   Stroke Maternal Grandmother        hemorrhagic   Cancer Maternal Grandmother        female cancer type unknown   Bone cancer Paternal Uncle    Esophageal cancer Neg Hx    Rectal cancer Neg Hx    Stomach cancer Neg Hx    Breast cancer Neg Hx    Colon polyps Neg Hx    Social History   Socioeconomic History   Marital status: Married    Spouse name: Not on file   Number of children: 2   Years of education: Not on file   Highest education level: Not on file  Occupational History    Employer: OTHER    Comment: [employer]   Occupation: retired Pharmacist, hospital  Tobacco Use   Smoking status: Never   Smokeless tobacco: Never  Vaping Use   Vaping Use: Never used  Substance and Sexual Activity   Alcohol use: Yes    Comment: Occasional-wine, 3 per week   Drug use: No   Sexual activity: Yes    Partners: Male  Other Topics Concern   Not on file  Social History Narrative   Married      2 children      Works for [employer]   Social Determinants of Health   Financial Resource Strain: Low Risk  (05/20/2022)   Overall Financial Resource Strain (CARDIA)    Difficulty of  Paying Living Expenses: Not hard at all  Food Insecurity: No Food Insecurity (05/20/2022)   Hunger Vital Sign    Worried About Running Out of Food in the Last Year: Never true    Ran Out of Food in the Last Year: Never true  Transportation Needs: No Transportation Needs (  05/20/2022)   PRAPARE - Hydrologist (Medical): No    Lack of Transportation (Non-Medical): No  Physical Activity: Insufficiently Active (05/20/2022)   Exercise Vital Sign    Days of Exercise per Week: 7 days    Minutes of Exercise per Session: 20 min  Stress: No Stress Concern Present (05/20/2022)   [place]    Feeling of Stress : Not at all  Social Connections: [place] (05/20/2022)   Social Connection and Isolation Panel [NHANES]    Frequency of Communication with Friends and Family: More than three times a week    Frequency of Social Gatherings with Friends and Family: More than three times a week    Attends Religious Services: More than 4 times per year    Active Member of [employer] or Organizations: Yes    Attends Archivist Meetings: 1 to 4 times per year    Marital Status: Married    Tobacco Counseling Counseling given: Not Answered   Clinical Intake:  Pre-visit preparation completed: Yes  Pain : No/denies pain     BMI - recorded: 33.25 Nutritional Status: BMI > 30  Obese Diabetes: No  How often do you need to have someone help you when you read instructions, pamphlets, or other written materials from your doctor or pharmacy?: 1 - Never  Diabetic?no  Interpreter Needed?: No  Information entered by :: Charlott Rakes, LPN   Activities of Daily Living    05/20/2022   11:15 AM  In your present state of health, do you have any difficulty performing the following activities:  Hearing? 1  Comment slight loss  Vision? 0  Difficulty concentrating or making decisions? 0  Walking or climbing  stairs? 0  Dressing or bathing? 0  Doing errands, shopping? 0  Preparing Food and eating ? N  Using the Toilet? N  In the past six months, have you accidently leaked urine? N  Do you have problems with loss of bowel control? N  Managing your Medications? N  Managing your Finances? N  Housekeeping or managing your Housekeeping? N    Patient Care Team: Tower, Wynelle Fanny, MD as PCP - General Tower, Wynelle Fanny, MD as Consulting Physician (Family Medicine) Bary Castilla, Forest Gleason, MD (General Surgery)  Indicate any recent Medical Services you may have received from other than [facility] providers in the past year (date may be approximate).     Assessment:   This is a routine wellness examination for [place].  Hearing/Vision screen Hearing Screening - Comments:: Pt stated slight loss  Vision Screening - Comments:: Pt follows up With Dr nice for annual eye exams   Dietary issues and exercise activities discussed: Current Exercise Habits: Home exercise routine, Type of exercise: walking, Time (Minutes): 20, Frequency (Times/Week): 7, Weekly Exercise (Minutes/Week): 140   Goals Addressed             This Visit's Progress    Patient Stated       Lose a little weight        Depression Screen    05/20/2022   11:13 AM 05/08/2022   12:13 PM 12/03/2021   10:30 AM 05/18/2021   10:24 AM 05/16/2020   10:49 AM 05/11/2018    9:57 AM 04/04/2017    2:13 PM  PHQ 2/9 Scores  PHQ - 2 Score 0 0 0 0 0 0 0  PHQ- 9 Score 0 2    3 0  Fall Risk    05/20/2022   11:15 AM 05/08/2022   12:13 PM 12/03/2021   10:30 AM 05/18/2021   10:24 AM 05/16/2020   10:48 AM  Fall Risk   Falls in the past year? 1 1 0 0 1  Number falls in past yr: '1 1  1 '$ 0  Injury with Fall? 0 0  1 1  Risk for fall due to : Impaired vision;History of fall(s) History of fall(s)     Follow up Falls prevention discussed Falls evaluation completed Falls evaluation completed Falls evaluation completed Falls evaluation completed    [place]:  Any stairs in or around the home? No  If so, are there any without handrails? No  Home free of loose throw rugs in walkways, pet beds, electrical cords, etc? Yes  Adequate lighting in your home to reduce risk of falls? Yes   ASSISTIVE DEVICES UTILIZED TO PREVENT FALLS:  Life alert? No  Use of a cane, walker or w/c? No  Grab bars in the bathroom? Yes  Shower chair or bench in shower? Yes  Elevated toilet seat or a handicapped toilet? Yes   TIMED UP AND GO:  Was the test performed? No .    Cognitive Function:    04/04/2017    2:16 PM 02/21/2016   10:06 AM  MMSE - Mini Mental State Exam  Orientation to time 5 5  Orientation to Place 5 5  Registration 3 3  Attention/ Calculation 0 0  Recall 3 3  Language- name 2 objects 0 0  Language- repeat 1 1  Language- follow 3 step command 3 3  Language- read & follow direction 0 0  Write a sentence 0 0  Copy design 0 0  Total score 20 20        05/20/2022   11:16 AM  6CIT Screen  What Year? 0 points  What month? 0 points  What time? 0 points  Count back from 20 0 points  Months in reverse 0 points  Repeat phrase 0 points  Total Score 0 points    Immunizations Immunization History  Administered Date(s) Administered   Influenza Split 02/13/2011, 04/14/2012   Influenza Whole 03/13/2006, 03/26/2007, 01/14/2008, 02/16/2009, 02/28/2010   Influenza, High Dose Seasonal PF 12/30/2019, 01/27/2021   Influenza,inj,Quad PF,6+ Mos 03/17/2013, 03/28/2014, 02/03/2015, 02/21/2016, 02/25/2017, 04/02/2018   Influenza-Unspecified 01/28/2019   PFIZER(Purple Top)SARS-COV-2 Vaccination 06/05/2019, 06/29/2019, 12/30/2019, 08/02/2020   PNEUMOCOCCAL CONJUGATE-20 11/10/2020   Pneumococcal Conjugate-13 09/08/2013   Pneumococcal Polysaccharide-23 02/28/2010   Td 10/27/1996, 11/13/2005, 02/21/2016   Zoster Recombinat (Shingrix) 11/10/2020, 01/27/2021   Zoster, Live 05/24/2010    TDAP status: Up to date  Flu  Vaccine status: Up to date  Pneumococcal vaccine status: Up to date  Covid-19 vaccine status: Completed vaccines  Qualifies for Shingles Vaccine? Yes   Zostavax completed Yes   Shingrix Completed?: Yes  Screening Tests Health Maintenance  Topic Date Due   Hepatitis C Screening  Never done   MAMMOGRAM  07/25/2021   INFLUENZA VACCINE  11/13/2021   COVID-19 Vaccine (5 - 2023-24 season) 12/14/2021   Medicare Annual Wellness (AWV)  05/21/2023   DTaP/Tdap/Td (4 - Tdap) 02/20/2026   Pneumonia Vaccine 33+ Years old  Completed   DEXA SCAN  Completed   Zoster Vaccines- Shingrix  Completed   HPV VACCINES  Aged Out   COLONOSCOPY (Pts 45-80yr Insurance coverage will need to be confirmed)  DPanama  Maintenance Due  Topic Date Due   Hepatitis C Screening  Never done   MAMMOGRAM  07/25/2021   INFLUENZA VACCINE  11/13/2021   COVID-19 Vaccine (5 - 2023-24 season) 12/14/2021    Colorectal cancer screening: No longer required.   Mammogram status: Ordered 07/25/20. Pt provided with contact info and advised to call to schedule appt.   Bone Density status: Completed 06/19/21. Results reflect: Bone density results: OSTEOPENIA. Repeat every 2 years.  Additional Screening:  Hepatitis C Screening: does qualify;   Vision Screening: Recommended annual ophthalmology exams for early detection of glaucoma and other disorders of the eye. Is the patient up to date with their annual eye exam?  Yes  Who is the provider or what is the name of the office in which the patient attends annual eye exams? Dr Matilde Sprang  If pt is not established with a provider, would they like to be referred to a provider to establish care? No .   Dental Screening: Recommended annual dental exams for proper oral hygiene  Community Resource Referral / Chronic Care Management: CRR required this visit?  No   CCM required this visit?  No      Plan:     I have personally reviewed and noted  the following in the patient's chart:   Medical and social history Use of alcohol, tobacco or illicit drugs  Current medications and supplements including opioid prescriptions. Patient is not currently taking opioid prescriptions. Functional ability and status Nutritional status Physical activity Advanced directives List of other physicians Hospitalizations, surgeries, and ER visits in previous 12 months Vitals Screenings to include cognitive, depression, and falls Referrals and appointments  In addition, I have reviewed and discussed with patient certain preventive protocols, quality metrics, and best practice recommendations. A written personalized care plan for preventive services as well as general preventive health recommendations were provided to patient.     Willette Brace, LPN   08/15/2021   Nurse Notes: none

## 2022-05-20 NOTE — Progress Notes (Signed)
Subjective:    Patient ID: Sierra Sparks, female    DOB: [birth year], 82 y.o.   MRN: [id number]  HPI Here for health maintenance exam and to review chronic medical problems   Wt Readings from Last 3 Encounters:  05/20/22 177 lb 8 oz (80.5 kg)  05/20/22 176 lb (79.8 kg)  05/08/22 176 lb (79.8 kg)   33.27 kg/m  Doing fine  Finally got over covid   Taking care of herself  Getting exercise- walking her dog    Immunization History  Administered Date(s) Administered   Influenza Split 02/13/2011, 04/14/2012   Influenza Whole 03/13/2006, 03/26/2007, 01/14/2008, 02/16/2009, 02/28/2010   Influenza, High Dose Seasonal PF 12/30/2019, 01/27/2021   Influenza,inj,Quad PF,6+ Mos 03/17/2013, 03/28/2014, 02/03/2015, 02/21/2016, 02/25/2017, 04/02/2018   Influenza-Unspecified 01/28/2019   PFIZER(Purple Top)SARS-COV-2 Vaccination 06/05/2019, 06/29/2019, 12/30/2019, 08/02/2020   PNEUMOCOCCAL CONJUGATE-20 11/10/2020   Pneumococcal Conjugate-13 09/08/2013   Pneumococcal Polysaccharide-23 02/28/2010   Td 10/27/1996, 11/13/2005, 02/21/2016   Zoster Recombinat (Shingrix) 11/10/2020, 01/27/2021   Zoster, Live 05/24/2010   Health Maintenance Due  Topic Date Due   Hepatitis C Screening  Never done   MAMMOGRAM  07/25/2021   Mammogram 07/2020-then bx which was b9 Self breast exam no lumps  Dexa  06/2021 stable osteopenia  Had to stop alendronate due to [facility] erosions [place] : had one, she does not pick feet up / needs to slow down  Fractures: none Supplements ca and D  Exercise : good/walking   Colonoscopy 07/2021    PHQ:  Mood has been fine    05/20/2022   11:13 AM 05/08/2022   12:13 PM 12/03/2021   10:30 AM 05/18/2021   10:24 AM 05/16/2020   10:49 AM  Depression screen PHQ 2/9  Decreased Interest 0 0 0 0 0  Down, Depressed, Hopeless 0 0 0 0 0  PHQ - 2 Score 0 0 0 0 0  Altered sleeping 0 1     Tired, decreased energy 0 1     Change in appetite 0 0     Feeling bad or failure about  yourself  0 0     Trouble concentrating 0 0     Moving slowly or fidgety/restless 0 0     Suicidal thoughts 0 0     PHQ-9 Score 0 2     Difficult doing work/chores Not difficult at all Not difficult at all        Prozac 20 mg daily   HTN bp is stable today  No cp or palpitations or headaches or edema  No side effects to medicines  BP Readings from Last 3 Encounters:  05/20/22 135/85  05/08/22 136/88  12/03/21 126/80    Amlodipine 5 mg daily   Iron def Feels about the same  From upper GI bleed Lab Results  Component Value Date   WBC 4.6 09/18/2021   HGB 12.4 09/18/2021   HCT 37.7 09/18/2021   MCV 87.3 09/18/2021   PLT 263.0 09/18/2021   Due for labs   Hyperlipidemia Crestor 5 mg zetia 10 mg   Still eating very healthy  Not as good as in the past   Patient Active Problem List   Diagnosis Date Noted   Irregular heart beat 06/22/2021   Colon cancer screening 05/18/2021   Encounter for screening mammogram for breast cancer 03/31/2017   Hearing loss in right ear 08/30/2016   Anal fissure 03/06/2016   Internal hemorrhoids 02/26/2016   Routine general medical examination at a health  care facility 02/03/2015   Estrogen deficiency 02/03/2015   Encounter for Medicare annual wellness exam 08/12/2012   Glaucoma 08/12/2012   History of colon polyps 05/15/2011   Anemia 05/15/2011   ALLERGIC RHINITIS 03/02/2008   Osteopenia 01/05/2007   HYPERCHOLESTEROLEMIA 10/24/2006   Depression with anxiety 10/24/2006   Essential hypertension 10/24/2006   HEMORRHOIDS 10/24/2006   FIBROCYSTIC BREAST DISEASE 10/24/2006   OSTEOARTHRITIS 10/24/2006   Past Medical History:  Diagnosis Date   Allergic rhinitis, cause unspecified    Allergy    Anemia    Anxiety state, unspecified    Arthritis    Cataract    forming    Colon polyps    Depressive disorder, not elsewhere classified    Diffuse cystic mastopathy    Disorder of bone and cartilage, unspecified    Diverticulosis     External hemorrhoids    Gallstones    GERD (gastroesophageal reflux disease)    Glaucoma    narrow angle thatwas corrected with laser surgery    Osteoarthrosis, unspecified whether generalized or localized, unspecified site    hands   Osteoporosis    Pure hypercholesterolemia    Thyroid disease    years past- no meds currently    Unspecified essential hypertension    Unspecified hemorrhoids without mention of complication    Past Surgical History:  Procedure Laterality Date   BREAST BIOPSY Left 08/11/2020   11:00 3cmfn Q clip u/s bx path pending   BREAST EXCISIONAL BIOPSY Left 08/25/2020   surgical exc papilloma with tag placement tag no.[id number]   BREAST LUMPECTOMY WITH RADIO FREQUENCY LOCALIZER Left 08/25/2020   Procedure: BREAST LUMPECTOMY WITH RADIO FREQUENCY LOCALIZER;  Surgeon: Benjamine Sprague, DO;  Location: [facility] ORS;  Service: General;  Laterality: Left;   BUNIONECTOMY Left 10/1999   CESAREAN SECTION  1981   CHOLECYSTECTOMY     COLONOSCOPY  01/2002   polyp; hemorrhoids;diverticulosis   COLONOSCOPY  2013   diverticulosis   DEXA  06/2000   osteopenia   DEXA  04/2003   Stable   HAND SURGERY Left    DIP fusion-left   HEMORRHOID SURGERY     POLYPECTOMY     TONSILLECTOMY     childhood   Social History   Tobacco Use   Smoking status: Never   Smokeless tobacco: Never  Vaping Use   Vaping Use: Never used  Substance Use Topics   Alcohol use: Yes    Comment: Occasional-wine, 3 per week   Drug use: No   Family History  Problem Relation Age of Onset   Stroke Mother    Coronary artery disease Mother 63   Hypertension Mother    Hyperlipidemia Mother    Arthritis Father    Lung cancer Father        + smoker   Hypertension Sister    Migraines Sister    Colon cancer Brother        ? primary colon   Liver cancer Brother    Cancer Brother        type unknown   Stroke Maternal Grandmother        hemorrhagic   Cancer Maternal Grandmother        female cancer type  unknown   Bone cancer Paternal Uncle    Esophageal cancer Neg Hx    Rectal cancer Neg Hx    Stomach cancer Neg Hx    Breast cancer Neg Hx    Colon polyps Neg Hx    No Known Allergies  Current Outpatient Medications on File Prior to Visit  Medication Sig Dispense Refill   amLODipine (NORVASC) 5 MG tablet Take 1 tablet by mouth once daily 90 tablet 2   Ascorbic Acid (VITAMIN C) 500 MG CAPS Take 500 mg by mouth daily.     benzonatate (TESSALON) 200 MG capsule Take 1 capsule (200 mg total) by mouth 3 (three) times daily as needed for cough. Swallow whole 30 capsule 1   Calcium Carbonate-Vit D-Min (CALCIUM 1200) 1200-1000 MG-UNIT CHEW Chew 600 mg by mouth 2 (two) times daily.     ezetimibe (ZETIA) 10 MG tablet Take 1 tablet (10 mg total) by mouth daily. 90 tablet 3   FLUoxetine (PROZAC) 20 MG capsule Take 1 capsule by mouth once daily 90 capsule 1   iron polysaccharides (FERREX 150) 150 MG capsule Take 1 capsule by mouth twice daily 180 capsule 1   Melatonin 10 MG TABS Take 10 mg by mouth daily.     Multiple Vitamin (MULTIVITAMIN) tablet Take 1 tablet by mouth daily.     naproxen sodium (ALEVE) 220 MG tablet Take 220 mg by mouth daily.     rosuvastatin (CRESTOR) 5 MG tablet Take 1 tablet (5 mg total) by mouth daily. 90 tablet 3   No current facility-administered medications on file prior to visit.    Review of Systems  Constitutional:  Positive for fatigue. Negative for activity change, appetite change, fever and unexpected weight change.  HENT:  Negative for congestion, ear pain, rhinorrhea, sinus pressure and sore throat.   Eyes:  Negative for pain, redness and visual disturbance.  Respiratory:  Negative for cough, shortness of breath and wheezing.   Cardiovascular:  Negative for chest pain and palpitations.  Gastrointestinal:  Negative for abdominal pain, blood in stool, constipation and diarrhea.  Endocrine: Negative for polydipsia and polyuria.  Genitourinary:  Negative for dysuria,  frequency and urgency.  Musculoskeletal:  Positive for arthralgias. Negative for back pain and myalgias.  Skin:  Negative for pallor and rash.  Allergic/Immunologic: Negative for environmental allergies.  Neurological:  Negative for dizziness, syncope and headaches.  Hematological:  Negative for adenopathy. Does not bruise/bleed easily.  Psychiatric/Behavioral:  Negative for decreased concentration and dysphoric mood. The patient is not nervous/anxious.        Occ lacks motivation but mood is good       Objective:   Physical Exam Constitutional:      General: She is not in acute distress.    Appearance: Normal appearance. She is well-developed. She is obese. She is not ill-appearing or diaphoretic.  HENT:     Head: Normocephalic and atraumatic.     Right Ear: Tympanic membrane, ear canal and external ear normal.     Left Ear: Tympanic membrane, ear canal and external ear normal.     Nose: Nose normal. No congestion.     Mouth/Throat:     Mouth: Mucous membranes are moist.     Pharynx: Oropharynx is clear. No posterior oropharyngeal erythema.  Eyes:     General: No scleral icterus.    Extraocular Movements: Extraocular movements intact.     Conjunctiva/sclera: Conjunctivae normal.     Pupils: Pupils are equal, round, and reactive to light.  Neck:     Thyroid: No thyromegaly.     Vascular: No carotid bruit or JVD.  Cardiovascular:     Rate and Rhythm: Normal rate and regular rhythm.     Pulses: Normal pulses.     Heart sounds: Normal heart sounds.  No gallop.  Pulmonary:     Effort: Pulmonary effort is normal. No respiratory distress.     Breath sounds: Normal breath sounds. No wheezing.     Comments: Good air exch Chest:     Chest wall: No tenderness.  Abdominal:     General: Bowel sounds are normal. There is no distension or abdominal bruit.     Palpations: Abdomen is soft. There is no mass.     Tenderness: There is no abdominal tenderness.     Hernia: No hernia is  present.  Genitourinary:    Comments: Breast exam: No mass, nodules, thickening, tenderness, bulging, retraction, inflamation, nipple discharge or skin changes noted.  No axillary or clavicular LA.     Musculoskeletal:        General: No tenderness. Normal range of motion.     Cervical back: Normal range of motion and neck supple. No rigidity. No muscular tenderness.     Right lower leg: No edema.     Left lower leg: No edema.     Comments: No kyphosis   Lymphadenopathy:     Cervical: No cervical adenopathy.  Skin:    General: Skin is warm and dry.     Coloration: Skin is not pale.     Findings: No erythema or rash.     Comments: Solar lentigines diffusely Scattered sks  Neurological:     Mental Status: She is alert. Mental status is at baseline.     Cranial Nerves: No cranial nerve deficit.     Motor: No abnormal muscle tone.     Coordination: Coordination normal.     Gait: Gait normal.     Deep Tendon Reflexes: Reflexes are normal and symmetric. Reflexes normal.  Psychiatric:        Mood and Affect: Mood normal.        Cognition and Memory: Cognition and memory normal.           Assessment & Plan:   Problem List Items Addressed This Visit       Cardiovascular and Mediastinum   Essential hypertension    bp in fair control at this time  BP Readings from Last 1 Encounters:  05/20/22 135/85  No changes needed Most recent labs reviewed  Disc lifstyle change with low sodium diet and exercise  Plan to continue amlodipine 5 mg daily  Nl pulse          Musculoskeletal and Integument   Osteopenia    Dexa reviewed 06/2021  Had to stop alendronate due to upper GI bleed, [place] Galas erosions  Takes c and D Walking for exercise  Disc plan to add strength training          Other   Anemia    Due to past upper GI bleed/[place] erosions Labs ordered  Takes ferrex 150 mg bid Under GI care       Colon cancer screening    Colonoscopy utd 07/2021      Depression with  anxiety    Doing well overall PHQ score of zero Some stressors Reviewed stressors/ coping techniques/symptoms/ support sources/ tx options and side effects in detail today  Enc good self care Will continue fluoextine 20 mg daily        Encounter for screening mammogram for breast cancer    Mammogram ordered Pt will call [place] to schedule this Had b9 bx in 2022      Relevant Orders   MM 3D SCREEN BREAST BILATERAL   HYPERCHOLESTEROLEMIA  Disc goals for lipids and reasons to control them Rev last labs with pt Rev low sat fat diet in detail   Lab ordered Taking crestor 5 mg daily  Zetia 10 mg daily       Routine general medical examination at a health care facility - Primary    Reviewed health habits including diet and exercise and skin cancer prevention Reviewed appropriate screening tests for age  Also reviewed health mt list, fam hx and immunization status , as well as social and family history   See HPI Labs ordered-she will return fasting for those Mammogram ordered-pt will call to schedule Rev dexa 06/2021 , has had a fall but no fx/ disc fall prev Colonoscopy utd 07/2021 PHq of 0

## 2022-05-20 NOTE — Assessment & Plan Note (Signed)
bp in fair control at this time  BP Readings from Last 1 Encounters:  05/20/22 135/85   No changes needed Most recent labs reviewed  Disc lifstyle change with low sodium diet and exercise  Plan to continue amlodipine 5 mg daily  Nl pulse

## 2022-05-20 NOTE — Assessment & Plan Note (Signed)
Reviewed health habits including diet and exercise and skin cancer prevention Reviewed appropriate screening tests for age  Also reviewed health mt list, fam hx and immunization status , as well as social and family history   See HPI Labs ordered-she will return fasting for those Mammogram ordered-pt will call to schedule Rev dexa 06/2021 , has had a fall but no fx/ disc fall prev Colonoscopy utd 07/2021 PHq of 0

## 2022-05-20 NOTE — Assessment & Plan Note (Signed)
Due to past upper GI bleed/[place] erosions Labs ordered  Takes ferrex 150 mg bid Under GI care

## 2022-05-20 NOTE — Assessment & Plan Note (Signed)
Colonoscopy utd 07/2021

## 2022-05-23 ENCOUNTER — Encounter: Payer: Self-pay | Admitting: *Deleted

## 2022-05-23 ENCOUNTER — Encounter: Payer: Self-pay | Admitting: Family Medicine

## 2022-05-23 ENCOUNTER — Other Ambulatory Visit (INDEPENDENT_AMBULATORY_CARE_PROVIDER_SITE_OTHER): Payer: Medicare PPO

## 2022-05-23 DIAGNOSIS — I10 Essential (primary) hypertension: Secondary | ICD-10-CM | POA: Diagnosis not present

## 2022-05-23 DIAGNOSIS — E7800 Pure hypercholesterolemia, unspecified: Secondary | ICD-10-CM

## 2022-05-23 DIAGNOSIS — D509 Iron deficiency anemia, unspecified: Secondary | ICD-10-CM | POA: Diagnosis not present

## 2022-05-23 DIAGNOSIS — R7303 Prediabetes: Secondary | ICD-10-CM | POA: Insufficient documentation

## 2022-05-23 DIAGNOSIS — R7309 Other abnormal glucose: Secondary | ICD-10-CM | POA: Insufficient documentation

## 2022-05-23 LAB — LIPID PANEL
Cholesterol: 154 mg/dL (ref 0–200)
HDL: 50 mg/dL (ref >39.00)
LDL Cholesterol: 74 mg/dL (ref 0–99)
NonHDL: 103.51
Total CHOL/HDL Ratio: 3
Triglycerides: 149 mg/dL (ref 0.0–149.0)
VLDL: 29.8 mg/dL (ref 0.0–40.0)

## 2022-05-23 LAB — COMPREHENSIVE METABOLIC PANEL
ALT: 19 U/L (ref 0–35)
AST: 18 U/L (ref 0–37)
Albumin: 4 g/dL (ref 3.5–5.2)
Alkaline Phosphatase: 86 U/L (ref 39–117)
BUN: 14 mg/dL (ref 6–23)
CO2: 29 mEq/L (ref 19–32)
Calcium: 9.6 mg/dL (ref 8.4–10.5)
Chloride: 102 mEq/L (ref 96–112)
Creatinine, Ser: 0.69 mg/dL (ref 0.40–1.20)
GFR: 82.53 mL/min (ref >60.00)
Glucose, Bld: 104 mg/dL — ABNORMAL HIGH (ref 70–99)
Potassium: 3.8 mEq/L (ref 3.5–5.1)
Sodium: 142 mEq/L (ref 135–145)
Total Bilirubin: 0.4 mg/dL (ref 0.2–1.2)
Total Protein: 6.4 g/dL (ref 6.0–8.3)

## 2022-05-23 LAB — CBC WITH DIFFERENTIAL/PLATELET
Basophils Absolute: 0 10*3/uL (ref 0.0–0.1)
Basophils Relative: 0.4 % (ref 0.0–3.0)
Eosinophils Absolute: 0 10*3/uL (ref 0.0–0.7)
Eosinophils Relative: 0.8 % (ref 0.0–5.0)
HCT: 39.3 % (ref 36.0–46.0)
Hemoglobin: 12.9 g/dL (ref 12.0–15.0)
Lymphocytes Relative: 30.4 % (ref 12.0–46.0)
Lymphs Abs: 1.5 10*3/uL (ref 0.7–4.0)
MCHC: 32.8 g/dL (ref 30.0–36.0)
MCV: 91.8 fl (ref 78.0–100.0)
Monocytes Absolute: 0.6 10*3/uL (ref 0.1–1.0)
Monocytes Relative: 10.9 % (ref 3.0–12.0)
Neutro Abs: 2.9 10*3/uL (ref 1.4–7.7)
Neutrophils Relative %: 57.5 % (ref 43.0–77.0)
Platelets: 286 10*3/uL (ref 150.0–400.0)
RBC: 4.28 Mil/uL (ref 3.87–5.11)
RDW: 14 % (ref 11.5–15.5)
WBC: 5.1 10*3/uL (ref 4.0–10.5)

## 2022-05-23 LAB — TSH: TSH: 2.34 u[IU]/mL (ref 0.35–5.50)

## 2022-05-23 LAB — FERRITIN: Ferritin: 25 ng/mL (ref 10.0–291.0)

## 2022-05-23 LAB — IRON: Iron: 78 ug/dL (ref 42–145)

## 2022-06-06 DIAGNOSIS — H2513 Age-related nuclear cataract, bilateral: Secondary | ICD-10-CM | POA: Diagnosis not present

## 2022-06-06 DIAGNOSIS — H35373 Puckering of macula, bilateral: Secondary | ICD-10-CM | POA: Diagnosis not present

## 2022-06-06 DIAGNOSIS — Z0100 Encounter for examination of eyes and vision without abnormal findings: Secondary | ICD-10-CM | POA: Diagnosis not present

## 2022-06-06 DIAGNOSIS — H43813 Vitreous degeneration, bilateral: Secondary | ICD-10-CM | POA: Diagnosis not present

## 2022-07-18 DIAGNOSIS — M1612 Unilateral primary osteoarthritis, left hip: Secondary | ICD-10-CM | POA: Diagnosis not present

## 2022-08-19 DIAGNOSIS — C44311 Basal cell carcinoma of skin of nose: Secondary | ICD-10-CM | POA: Diagnosis not present

## 2022-08-19 DIAGNOSIS — D485 Neoplasm of uncertain behavior of skin: Secondary | ICD-10-CM | POA: Diagnosis not present

## 2022-08-28 ENCOUNTER — Ambulatory Visit
Admission: RE | Admit: 2022-08-28 | Discharge: 2022-08-28 | Disposition: A | Discharge status: Home / Self Care | Payer: Medicare PPO | Source: Ambulatory Visit | Attending: Family Medicine | Admitting: Family Medicine

## 2022-08-28 ENCOUNTER — Other Ambulatory Visit: Payer: Self-pay | Admitting: Family Medicine

## 2022-08-28 DIAGNOSIS — Z1231 Encounter for screening mammogram for malignant neoplasm of breast: Secondary | ICD-10-CM | POA: Diagnosis not present

## 2022-09-04 DIAGNOSIS — C44311 Basal cell carcinoma of skin of nose: Secondary | ICD-10-CM | POA: Diagnosis not present

## 2022-10-09 ENCOUNTER — Other Ambulatory Visit: Payer: Self-pay | Admitting: Family Medicine

## 2022-11-06 DIAGNOSIS — S93401A Sprain of unspecified ligament of right ankle, initial encounter: Secondary | ICD-10-CM | POA: Diagnosis not present

## 2022-12-11 ENCOUNTER — Telehealth: Payer: Self-pay | Admitting: Family Medicine

## 2022-12-11 NOTE — Telephone Encounter (Signed)
FYI: This call has been transferred to Access Nurse. Once the result note has been entered staff can address the message at that time.  Patient called in with the following symptoms:  Red Word:dizziness    Please advise at [facility] [phone]  Message is routed to Provider Pool and [facility] Triage    Pt's husband's, Sierra Sparks, called stating the pt has been experiencing vertigo & along with not feeling well, overall. Told Sierra Sparks the soonest slot Dr. Milinda Antis has available isn't until 9/3, offered sooner appts with other providers. Sierra Sparks declined, stating the pt only wants to see Dr. Milinda Antis. Transferred Sierra Sparks & pt to access nurse.

## 2022-12-11 NOTE — Telephone Encounter (Signed)
Sending note to Dr Milinda Antis and [employer].per access note pt was advised to be seen within 24 hrs but pt wants to keep already scheduled appt with Dr Milinda Antis for 12/17/22.

## 2022-12-11 NOTE — Telephone Encounter (Signed)
Aware she declines more urgent attention  Agree with ER precautions

## 2022-12-17 ENCOUNTER — Encounter: Payer: Self-pay | Admitting: Family Medicine

## 2022-12-17 ENCOUNTER — Ambulatory Visit: Payer: Medicare PPO | Admitting: Family Medicine

## 2022-12-17 VITALS — BP 132/80 | HR 80 | Temp 97.8°F | Ht 61.25 in | Wt 168.1 lb

## 2022-12-17 DIAGNOSIS — K648 Other hemorrhoids: Secondary | ICD-10-CM

## 2022-12-17 DIAGNOSIS — I10 Essential (primary) hypertension: Secondary | ICD-10-CM

## 2022-12-17 DIAGNOSIS — J301 Allergic rhinitis due to pollen: Secondary | ICD-10-CM | POA: Diagnosis not present

## 2022-12-17 DIAGNOSIS — H81399 Other peripheral vertigo, unspecified ear: Secondary | ICD-10-CM | POA: Insufficient documentation

## 2022-12-17 DIAGNOSIS — H81393 Other peripheral vertigo, bilateral: Secondary | ICD-10-CM

## 2022-12-17 MED ORDER — FLUTICASONE PROPIONATE 50 MCG/ACT NA SUSP: 2.0000 | Freq: Every day | NASAL | 6 refills | Status: AC

## 2022-12-17 MED ORDER — HYDROCORTISONE ACETATE 25 MG RE SUPP: 25.0000 mg | Freq: Every day | RECTAL | 1 refills | Status: DC

## 2022-12-17 NOTE — Assessment & Plan Note (Signed)
Bilateral  Reassuring exam  Worsened with position change  3 weeks- much improved from originally but still present  Has dramamine /meclizine Discussed change position slowly   Flonase ns prescription  Update if not starting to improve in a week or if worsening  Call back and Er precautions noted in detail today   Consider ENT if not improvement Discussed signs and symptoms of cva to watch for

## 2022-12-17 NOTE — Assessment & Plan Note (Addendum)
Sneezing/ cong/rhinorrhea  May be adding to periph vertigo with ETD Flonase prescription  Update if not starting to improve in a week or if worsening   Call back and Er precautions noted in detail today

## 2022-12-17 NOTE — Patient Instructions (Signed)
Try and avoid straining  Use the anusol suppository at bedtime daily for no more than 12 days   If not improving let us know   For vertigo - the dramamine is ok  Start flonase nasal spray- twice daily for 2 days then once daily  Use it in allergy season   If not improving in 1-2 weeks -please call   If worse - let us know  If severe go to the ER If any stroke symptoms go to the ER   Change position slowly

## 2022-12-17 NOTE — Assessment & Plan Note (Signed)
bp in fair control at this time  BP Readings from Last 1 Encounters:  12/17/22 132/80   No changes needed Most recent labs reviewed  Disc lifstyle change with low sodium diet and exercise  Plan to continue amlodipine 5 mg daily

## 2022-12-17 NOTE — Progress Notes (Signed)
Subjective:    Patient ID: Sierra Sparks, female    DOB: 16-Nov-1940, 82 y.o.   MRN: [id number]  HPI  Wt Readings from Last 3 Encounters:  12/17/22 168 lb 2 oz (76.3 kg)  05/20/22 177 lb 8 oz (80.5 kg)  05/20/22 176 lb (79.8 kg)   31.51 kg/m  Vitals:   12/17/22 1117 12/17/22 1155  BP: (!) 146/80 132/80  Pulse: 80   Temp: 97.8 F (36.6 C)   SpO2: 94%    Pt presents for c/o  Dizziness / ? Vertigo Not feeling well  Hemorrhoids   Vertigo for 3 weeks - bad for 4 days Then just in morn and evening  Makes her walk less steady  Triggered by moving her head  Spinning sensation  Has meclizine and dramamine - cause sedation   Ears do not hurt or feel full  Blows her nose and sneezes a lot   No stroke symptoms No headache  No head trauma  No neuro changes   Hemorrhoids  Cannot keep rectal area clean Bleeds a lot  Some soreness  A little itch /burn at times   Her iron constipates her  Uses miralax  No diarrhea  Strains occasionally    Had hemorrhoid surgery in distant past- ? Banding / unsure    Colonoscopy 07/2021 Benign polyp  Internal hemorrhoids Diverticula  Takes iron for anemia from GI source/ cameron erosions  Lab Results  Component Value Date   WBC 5.1 05/23/2022   HGB 12.9 05/23/2022   HCT 39.3 05/23/2022   MCV 91.8 05/23/2022   PLT 286.0 05/23/2022   Lab Results  Component Value Date   IRON 78 05/23/2022   TIBC 502 (H) 06/14/2021   FERRITIN 25.0 05/23/2022       HTN bp is stable today  No cp or palpitations or headaches or edema  No side effects to medicines  BP Readings from Last 3 Encounters:  12/17/22 132/80  05/20/22 135/85  05/08/22 136/88    Amlodipine 5 mg daily   Lab Results  Component Value Date   NA 142 05/23/2022   K 3.8 05/23/2022   CO2 29 05/23/2022   GLUCOSE 104 (H) 05/23/2022   BUN 14 05/23/2022   CREATININE 0.69 05/23/2022   CALCIUM 9.6 05/23/2022   GFR 82.53 05/23/2022   GFRNONAA 75.99 02/26/2010       Patient Active Problem List   Diagnosis Date Noted   Peripheral vertigo 12/17/2022   Elevated glucose level 05/23/2022   Irregular heart beat 06/22/2021   Colon cancer screening 05/18/2021   Encounter for screening mammogram for breast cancer 03/31/2017   Hearing loss in right ear 08/30/2016   Anal fissure 03/06/2016   Internal hemorrhoids 02/26/2016   Routine general medical examination at a health care facility 02/03/2015   Estrogen deficiency 02/03/2015   Encounter for Medicare annual wellness exam 08/12/2012   Glaucoma 08/12/2012   History of colon polyps 05/15/2011   Anemia 05/15/2011   Allergic rhinitis 03/02/2008   Osteopenia 01/05/2007   HYPERCHOLESTEROLEMIA 10/24/2006   Depression with anxiety 10/24/2006   Essential hypertension 10/24/2006   HEMORRHOIDS 10/24/2006   FIBROCYSTIC BREAST DISEASE 10/24/2006   OSTEOARTHRITIS 10/24/2006   Past Medical History:  Diagnosis Date   Allergic rhinitis, cause unspecified    Allergy    Anemia    Anxiety state, unspecified    Arthritis    Cataract    forming    Colon polyps    Depressive disorder, not elsewhere  classified    Diffuse cystic mastopathy    Disorder of bone and cartilage, unspecified    Diverticulosis    External hemorrhoids    Gallstones    GERD (gastroesophageal reflux disease)    Glaucoma    narrow angle thatwas corrected with laser surgery    Osteoarthrosis, unspecified whether generalized or localized, unspecified site    hands   Osteoporosis    Pure hypercholesterolemia    Thyroid disease    years past- no meds currently    Unspecified essential hypertension    Unspecified hemorrhoids without mention of complication    Past Surgical History:  Procedure Laterality Date   BREAST BIOPSY Left 08/11/2020   11:00 3cmfn Q clip u/s bx papilloma   BREAST EXCISIONAL BIOPSY Left 08/25/2020   surgical exc papilloma with tag placement tag [id number]   BREAST LUMPECTOMY WITH RADIO FREQUENCY LOCALIZER  Left 08/25/2020   Procedure: BREAST LUMPECTOMY WITH RADIO FREQUENCY LOCALIZER;  Surgeon: Sung Amabile, DO;  Location: [facility];  Service: General;  Laterality: Left;   BUNIONECTOMY Left 10/1999   CESAREAN SECTION  1981   CHOLECYSTECTOMY     COLONOSCOPY  01/2002   polyp; hemorrhoids;diverticulosis   COLONOSCOPY  2013   diverticulosis   DEXA  06/2000   osteopenia   DEXA  04/2003   Stable   HAND SURGERY Left    DIP fusion-left   HEMORRHOID SURGERY     POLYPECTOMY     TONSILLECTOMY     childhood   Social History   Tobacco Use   Smoking status: Never   Smokeless tobacco: Never  Vaping Use   Vaping status: Never Used  Substance Use Topics   Alcohol use: Yes    Comment: Occasional-wine, 3 per week   Drug use: No   Family History  Problem Relation Age of Onset   Stroke Mother    Coronary artery disease Mother 56   Hypertension Mother    Hyperlipidemia Mother    Arthritis Father    Lung cancer Father        + smoker   Hypertension Sister    Migraines Sister    Colon cancer Brother        ? primary colon   Liver cancer Brother    Cancer Brother        type unknown   Stroke Maternal Grandmother        hemorrhagic   Cancer Maternal Grandmother        female cancer type unknown   Bone cancer Paternal Uncle    Esophageal cancer Neg Hx    Rectal cancer Neg Hx    Stomach cancer Neg Hx    Breast cancer Neg Hx    Colon polyps Neg Hx    No Known Allergies Current Outpatient Medications on File Prior to Visit  Medication Sig Dispense Refill   amLODipine (NORVASC) 5 MG tablet Take 1 tablet by mouth once daily 90 tablet 2   Ascorbic Acid (VITAMIN C) 500 MG CAPS Take 500 mg by mouth daily.     Calcium Carbonate-Vit D-Min (CALCIUM 1200) 1200-1000 MG-UNIT CHEW Chew 600 mg by mouth 2 (two) times daily.     ezetimibe (ZETIA) 10 MG tablet Take 1 tablet (10 mg total) by mouth daily. 90 tablet 3   FLUoxetine (PROZAC) 20 MG capsule Take 1 capsule by mouth once daily 90 capsule 1    iron polysaccharides (FERREX 150) 150 MG capsule Take 1 capsule by mouth twice daily 180 capsule 2  Melatonin 10 MG TABS Take 10 mg by mouth daily.     Multiple Vitamin (MULTIVITAMIN) tablet Take 1 tablet by mouth daily.     rosuvastatin (CRESTOR) 5 MG tablet Take 1 tablet (5 mg total) by mouth daily. 90 tablet 3   No current facility-administered medications on file prior to visit.    Review of Systems  Constitutional:  Negative for activity change, appetite change, fatigue, fever and unexpected weight change.  HENT:  Negative for congestion, ear pain, rhinorrhea, sinus pressure and sore throat.   Eyes:  Negative for pain, redness and visual disturbance.  Respiratory:  Negative for cough, shortness of breath and wheezing.   Cardiovascular:  Negative for chest pain and palpitations.  Gastrointestinal:  Positive for anal bleeding. Negative for abdominal pain, blood in stool, constipation and diarrhea.  Endocrine: Negative for polydipsia and polyuria.  Genitourinary:  Negative for dysuria, frequency and urgency.  Musculoskeletal:  Negative for arthralgias, back pain and myalgias.  Skin:  Negative for pallor and rash.  Allergic/Immunologic: Negative for environmental allergies.  Neurological:  Positive for dizziness. Negative for tremors, seizures, syncope, facial asymmetry, speech difficulty, weakness, light-headedness, numbness and headaches.  Hematological:  Negative for adenopathy. Does not bruise/bleed easily.  Psychiatric/Behavioral:  Negative for decreased concentration and dysphoric mood. The patient is not nervous/anxious.        Objective:   Physical Exam Constitutional:      General: She is not in acute distress.    Appearance: Normal appearance. She is well-developed. She is obese. She is not ill-appearing or diaphoretic.  HENT:     Head: Normocephalic and atraumatic.     Right Ear: Ear canal and external ear normal.     Left Ear: Ear canal and external ear normal.      Ears:     Comments: TMs are dull bilaterally  Not bulging    Nose: Congestion present.     Comments: Boggy nares Slightly cong    Mouth/Throat:     Pharynx: No oropharyngeal exudate or posterior oropharyngeal erythema.  Eyes:     General: No scleral icterus.       Right eye: No discharge.        Left eye: No discharge.     Conjunctiva/sclera: Conjunctivae normal.     Pupils: Pupils are equal, round, and reactive to light.     Comments: 2-3 beats of horiz nystagmus noted bilaterally   Neck:     Thyroid: No thyromegaly.     Vascular: No carotid bruit or JVD.     Trachea: No tracheal deviation.  Cardiovascular:     Rate and Rhythm: Normal rate and regular rhythm.     Heart sounds: Normal heart sounds. No murmur heard. Pulmonary:     Effort: Pulmonary effort is normal. No respiratory distress.     Breath sounds: Normal breath sounds. No wheezing or rales.  Abdominal:     General: Bowel sounds are normal. There is no distension.     Palpations: Abdomen is soft. There is no mass.     Tenderness: There is no abdominal tenderness.  Genitourinary:    Rectum: Guaiac result positive. Tenderness and external hemorrhoid present. No mass or anal fissure. Normal anal tone.     Comments: External hemorrhoid at 3:00-non thrombosed  Mildly tender rectal exam -no M noted No gross blood but guiac positive stool   Anoscopy not done today  Musculoskeletal:        General: No tenderness.  Cervical back: Full passive range of motion without pain, normal range of motion and neck supple.  Lymphadenopathy:     Cervical: No cervical adenopathy.  Skin:    General: Skin is warm and dry.     Coloration: Skin is not pale.     Findings: No rash.  Neurological:     Mental Status: She is alert and oriented to person, place, and time.     Cranial Nerves: No cranial nerve deficit, dysarthria or facial asymmetry.     Sensory: No sensory deficit.     Motor: No weakness, tremor, atrophy, abnormal muscle  tone, seizure activity or pronator drift.     Coordination: Romberg sign negative. Coordination normal. Finger-Nose-Finger Test normal.     Gait: Gait normal.     Deep Tendon Reflexes: Reflexes are normal and symmetric. Reflexes normal.     Comments: No focal cerebellar signs   Psychiatric:        Behavior: Behavior normal.        Thought Content: Thought content normal.           Assessment & Plan:   Problem List Items Addressed This Visit       Cardiovascular and Mediastinum   Essential hypertension    bp in fair control at this time  BP Readings from Last 1 Encounters:  12/17/22 132/80   No changes needed Most recent labs reviewed  Disc lifstyle change with low sodium diet and exercise  Plan to continue amlodipine 5 mg daily        Internal hemorrhoids    Causing bleeding Also external hemorrhoid (non thrombosed) with some discomfort Reviewed last colonoscopy report 2023   Encouraged to avoid straining Anusol hc suppository sent to pharmacy  Update if not starting to improve in a week or if worsening  Call back and Er precautions noted in detail today          Respiratory   Allergic rhinitis    Sneezing/ cong/rhinorrhea  May be adding to periph vertigo with ETD Flonase prescription  Update if not starting to improve in a week or if worsening   Call back and Er precautions noted in detail today          Nervous and Auditory   Peripheral vertigo - Primary    Bilateral  Reassuring exam  Worsened with position change  3 weeks- much improved from originally but still present  Has dramamine /meclizine Discussed change position slowly   Flonase ns prescription  Update if not starting to improve in a week or if worsening  Call back and Er precautions noted in detail today   Consider ENT if not improvement Discussed signs and symptoms of cva to watch for

## 2022-12-17 NOTE — Assessment & Plan Note (Signed)
Causing bleeding Also external hemorrhoid (non thrombosed) with some discomfort Reviewed last colonoscopy report 2023   Encouraged to avoid straining Anusol hc suppository sent to pharmacy  Update if not starting to improve in a week or if worsening  Call back and Er precautions noted in detail today

## 2023-01-13 ENCOUNTER — Telehealth: Payer: Self-pay | Admitting: Family Medicine

## 2023-01-13 DIAGNOSIS — K649 Unspecified hemorrhoids: Secondary | ICD-10-CM

## 2023-01-13 DIAGNOSIS — K648 Other hemorrhoids: Secondary | ICD-10-CM

## 2023-01-13 NOTE — Addendum Note (Signed)
Addended by: Roxy Manns A on: 01/13/2023 07:22 PM   Modules accepted: Orders

## 2023-01-13 NOTE — Telephone Encounter (Signed)
I put the referral in  Please give her the # to call and schedule

## 2023-01-13 NOTE — Telephone Encounter (Signed)
Pt called requesting a referral from Dr. Milinda Antis for her hemorrhoids? Preferred provider, Dr. Loel Dubonnet Vanga @ [facility] at [place]. Call back # [phone]

## 2023-01-15 NOTE — Telephone Encounter (Signed)
See referral's comments regarding this referral

## 2023-01-15 NOTE — Telephone Encounter (Signed)
I received this  Denman George, CMA  Tyr Franca, Audrie Gallus, MD Patient is a establish patient with [facility] and needs to see them since she is a establish patient with there office  Please let her know  I can do a referral if she wants to go back to [facility] in [place]   I assume this is because of the shortage of specialists

## 2023-01-16 NOTE — Telephone Encounter (Signed)
Pt notified. She doesn't need a new referral since she is an established pt she will call them and schedule an appt

## 2023-01-17 ENCOUNTER — Other Ambulatory Visit: Payer: Self-pay | Admitting: Family Medicine

## 2023-01-22 ENCOUNTER — Encounter: Payer: Self-pay | Admitting: Podiatry

## 2023-01-22 ENCOUNTER — Ambulatory Visit: Payer: Medicare PPO | Admitting: Podiatry

## 2023-01-22 ENCOUNTER — Ambulatory Visit (INDEPENDENT_AMBULATORY_CARE_PROVIDER_SITE_OTHER): Payer: Medicare PPO

## 2023-01-22 DIAGNOSIS — M778 Other enthesopathies, not elsewhere classified: Secondary | ICD-10-CM

## 2023-01-22 MED ORDER — MELOXICAM 7.5 MG PO TABS: 7.5000 mg | ORAL_TABLET | Freq: Every day | ORAL | 3 refills | Status: DC

## 2023-01-22 NOTE — Progress Notes (Signed)
Subjective:  Patient ID: Sierra Sparks, female    DOB: 07/09/1940,  MRN: [id number] HPI Chief Complaint  Patient presents with   Foot Pain    Dorsal midfoot right - aching and swelling x 2 years, worsened over time, had it checked at one time and told her she had a stress fracture, walking a lot aggravates it to the point she has to sit, tried a brace-no help   New Patient (Initial Visit)    82 y.o. female presents with the above complaint.   ROS: Denies fever chills nausea by muscle aches pains calf pain back pain chest pain shortness of breath  Past Medical History:  Diagnosis Date   Allergic rhinitis, cause unspecified    Allergy    Anemia    Anxiety state, unspecified    Arthritis    Cataract    forming    Colon polyps    Depressive disorder, not elsewhere classified    Diffuse cystic mastopathy    Disorder of bone and cartilage, unspecified    Diverticulosis    External hemorrhoids    Gallstones    GERD (gastroesophageal reflux disease)    Glaucoma    narrow angle thatwas corrected with laser surgery    Osteoarthrosis, unspecified whether generalized or localized, unspecified site    hands   Osteoporosis    Pure hypercholesterolemia    Thyroid disease    years past- no meds currently    Unspecified essential hypertension    Unspecified hemorrhoids without mention of complication    Past Surgical History:  Procedure Laterality Date   BREAST BIOPSY Left 08/11/2020   11:00 3cmfn Q clip u/s bx papilloma   BREAST EXCISIONAL BIOPSY Left 08/25/2020   surgical exc papilloma with tag placement tag no.[id number]   BREAST LUMPECTOMY WITH RADIO FREQUENCY LOCALIZER Left 08/25/2020   Procedure: BREAST LUMPECTOMY WITH RADIO FREQUENCY LOCALIZER;  Surgeon: Sung Amabile, DO;  Location: [facility];  Service: General;  Laterality: Left;   BUNIONECTOMY Left 10/1999   CESAREAN SECTION  1981   CHOLECYSTECTOMY     COLONOSCOPY  01/2002   polyp; hemorrhoids;diverticulosis    COLONOSCOPY  2013   diverticulosis   DEXA  06/2000   osteopenia   DEXA  04/2003   Stable   HAND SURGERY Left    DIP fusion-left   HEMORRHOID SURGERY     POLYPECTOMY     TONSILLECTOMY     childhood    Current Outpatient Medications:    meloxicam (MOBIC) 7.5 MG tablet, Take 1 tablet (7.5 mg total) by mouth daily., Disp: 90 tablet, Rfl: 3   amLODipine (NORVASC) 5 MG tablet, Take 1 tablet by mouth once daily, Disp: 90 tablet, Rfl: 2   Ascorbic Acid (VITAMIN C) 500 MG CAPS, Take 500 mg by mouth daily., Disp: , Rfl:    Calcium Carbonate-Vit D-Min (CALCIUM 1200) 1200-1000 MG-UNIT CHEW, Chew 600 mg by mouth 2 (two) times daily., Disp: , Rfl:    ezetimibe (ZETIA) 10 MG tablet, Take 1 tablet by mouth once daily, Disp: 90 tablet, Rfl: 0   FLUoxetine (PROZAC) 20 MG capsule, Take 1 capsule by mouth once daily, Disp: 90 capsule, Rfl: 1   fluticasone (FLONASE) 50 MCG/ACT nasal spray, Place 2 sprays into both nostrils daily., Disp: 16 g, Rfl: 6   hydrocortisone (ANUSOL-HC) 25 MG suppository, Place 1 suppository (25 mg total) rectally at bedtime., Disp: 12 suppository, Rfl: 1   iron polysaccharides (FERREX 150) 150 MG capsule, Take 1 capsule by mouth twice  daily, Disp: 180 capsule, Rfl: 2   Melatonin 10 MG TABS, Take 10 mg by mouth daily., Disp: , Rfl:    Multiple Vitamin (MULTIVITAMIN) tablet, Take 1 tablet by mouth daily., Disp: , Rfl:    rosuvastatin (CRESTOR) 5 MG tablet, Take 1 tablet (5 mg total) by mouth daily., Disp: 90 tablet, Rfl: 3  No Known Allergies Review of Systems Objective:  There were no vitals filed for this visit.  General: Well developed, nourished, in no acute distress, alert and oriented x3   Dermatological: Skin is warm, dry and supple bilateral. Nails x 10 are well maintained; remaining integument appears unremarkable at this time. There are no open sores, no preulcerative lesions, no rash or signs of infection present.  Vascular: Dorsalis Pedis artery and Posterior  Tibial artery pedal pulses are 2/4 bilateral with immedate capillary fill time. Pedal hair growth present. No varicosities and no lower extremity edema present bilateral.   Neruologic: Grossly intact via light touch bilateral. Vibratory intact via tuning fork bilateral. Protective threshold with Semmes Wienstein monofilament intact to all pedal sites bilateral. Patellar and Achilles deep tendon reflexes 2+ bilateral. No Babinski or clonus noted bilateral.   Musculoskeletal: No gross boney pedal deformities bilateral. No pain, crepitus, or limitation noted with foot and ankle range of motion bilateral. Muscular strength 5/5 in all groups tested bilateral.  Pain on palpation dorsal aspect of the tarsometatarsal joints right foot.  Pain on frontal plane range of motion.  Gait: Unassisted, Nonantalgic.    Radiographs:  Radiographs right foot taken today demonstrate osseously mature individual with generalized demineralization of the bone osteoarthritic changes entire midfoot TMT joints minimal spurring.  Assessment & Plan:   Assessment: Osteoarthritis capsulitis midfoot.  Plan: Discussed etiology pathology conservative versus surgical therapies.  At this point discussed appropriate shoe gear.  Discussed injection therapy surgery.  I think we will first began with topical anti-inflammatories such as Voltaren gel.  We will also get her started on meloxicam 7.5 mg 1 by mouth daily dispense 90 with 3 refills.  Follow-up with her in 1 month     Elanie Hammitt T. [place], [place]

## 2023-01-28 ENCOUNTER — Other Ambulatory Visit: Payer: Self-pay | Admitting: Family Medicine

## 2023-02-24 DIAGNOSIS — M1612 Unilateral primary osteoarthritis, left hip: Secondary | ICD-10-CM | POA: Diagnosis not present

## 2023-03-26 DIAGNOSIS — M1612 Unilateral primary osteoarthritis, left hip: Secondary | ICD-10-CM | POA: Diagnosis not present

## 2023-03-26 DIAGNOSIS — M5416 Radiculopathy, lumbar region: Secondary | ICD-10-CM | POA: Diagnosis not present

## 2023-04-22 ENCOUNTER — Other Ambulatory Visit: Payer: Self-pay | Admitting: Family Medicine

## 2023-04-23 DIAGNOSIS — M5442 Lumbago with sciatica, left side: Secondary | ICD-10-CM | POA: Diagnosis not present

## 2023-04-25 DIAGNOSIS — M799 Soft tissue disorder, unspecified: Secondary | ICD-10-CM | POA: Diagnosis not present

## 2023-04-25 DIAGNOSIS — M5442 Lumbago with sciatica, left side: Secondary | ICD-10-CM | POA: Diagnosis not present

## 2023-04-25 DIAGNOSIS — M6281 Muscle weakness (generalized): Secondary | ICD-10-CM | POA: Diagnosis not present

## 2023-04-30 ENCOUNTER — Other Ambulatory Visit: Payer: Self-pay | Admitting: Family Medicine

## 2023-04-30 DIAGNOSIS — M5442 Lumbago with sciatica, left side: Secondary | ICD-10-CM | POA: Diagnosis not present

## 2023-04-30 DIAGNOSIS — M799 Soft tissue disorder, unspecified: Secondary | ICD-10-CM | POA: Diagnosis not present

## 2023-04-30 DIAGNOSIS — M6281 Muscle weakness (generalized): Secondary | ICD-10-CM | POA: Diagnosis not present

## 2023-05-09 DIAGNOSIS — M1612 Unilateral primary osteoarthritis, left hip: Secondary | ICD-10-CM | POA: Diagnosis not present

## 2023-05-09 DIAGNOSIS — M5416 Radiculopathy, lumbar region: Secondary | ICD-10-CM | POA: Diagnosis not present

## 2023-05-13 ENCOUNTER — Other Ambulatory Visit: Payer: Self-pay | Admitting: Family Medicine

## 2023-05-20 DIAGNOSIS — M545 Low back pain, unspecified: Secondary | ICD-10-CM | POA: Diagnosis not present

## 2023-05-27 DIAGNOSIS — M48061 Spinal stenosis, lumbar region without neurogenic claudication: Secondary | ICD-10-CM | POA: Diagnosis not present

## 2023-05-28 ENCOUNTER — Telehealth: Payer: Self-pay | Admitting: Family Medicine

## 2023-05-28 DIAGNOSIS — D509 Iron deficiency anemia, unspecified: Secondary | ICD-10-CM

## 2023-05-28 DIAGNOSIS — I10 Essential (primary) hypertension: Secondary | ICD-10-CM

## 2023-05-28 DIAGNOSIS — E7800 Pure hypercholesterolemia, unspecified: Secondary | ICD-10-CM

## 2023-05-28 DIAGNOSIS — R7309 Other abnormal glucose: Secondary | ICD-10-CM

## 2023-05-28 NOTE — Telephone Encounter (Signed)
-----   Message from Alvina Chou sent at 05/08/2023 10:51 AM EST ----- Regarding: lab orders for Fri 2.14.25 Patient is scheduled for CPX labs, please order future labs, Thanks , Camelia Eng

## 2023-05-30 ENCOUNTER — Other Ambulatory Visit: Payer: Medicare PPO

## 2023-05-30 ENCOUNTER — Ambulatory Visit: Payer: Medicare PPO

## 2023-05-30 VITALS — BP 140/86 | Ht 61.25 in | Wt 175.0 lb

## 2023-05-30 DIAGNOSIS — E7800 Pure hypercholesterolemia, unspecified: Secondary | ICD-10-CM | POA: Diagnosis not present

## 2023-05-30 DIAGNOSIS — Z0000 Encounter for general adult medical examination without abnormal findings: Secondary | ICD-10-CM | POA: Diagnosis not present

## 2023-05-30 DIAGNOSIS — I10 Essential (primary) hypertension: Secondary | ICD-10-CM | POA: Diagnosis not present

## 2023-05-30 DIAGNOSIS — R7309 Other abnormal glucose: Secondary | ICD-10-CM | POA: Diagnosis not present

## 2023-05-30 DIAGNOSIS — D509 Iron deficiency anemia, unspecified: Secondary | ICD-10-CM | POA: Diagnosis not present

## 2023-05-30 LAB — CBC WITH DIFFERENTIAL/PLATELET
Basophils Absolute: 0 10*3/uL (ref 0.0–0.1)
Basophils Relative: 0.6 % (ref 0.0–3.0)
Eosinophils Absolute: 0 10*3/uL (ref 0.0–0.7)
Eosinophils Relative: 0.9 % (ref 0.0–5.0)
HCT: 40 % (ref 36.0–46.0)
Hemoglobin: 13.2 g/dL (ref 12.0–15.0)
Lymphocytes Relative: 38 % (ref 12.0–46.0)
Lymphs Abs: 1.5 10*3/uL (ref 0.7–4.0)
MCHC: 33 g/dL (ref 30.0–36.0)
MCV: 95 fL (ref 78.0–100.0)
Monocytes Absolute: 0.6 10*3/uL (ref 0.1–1.0)
Monocytes Relative: 15.7 % — ABNORMAL HIGH (ref 3.0–12.0)
Neutro Abs: 1.7 10*3/uL (ref 1.4–7.7)
Neutrophils Relative %: 44.8 % (ref 43.0–77.0)
Platelets: 256 10*3/uL (ref 150.0–400.0)
RBC: 4.21 Mil/uL (ref 3.87–5.11)
RDW: 14.1 % (ref 11.5–15.5)
WBC: 3.9 10*3/uL — ABNORMAL LOW (ref 4.0–10.5)

## 2023-05-30 LAB — COMPREHENSIVE METABOLIC PANEL
ALT: 16 U/L (ref 0–35)
AST: 20 U/L (ref 0–37)
Albumin: 4.1 g/dL (ref 3.5–5.2)
Alkaline Phosphatase: 86 U/L (ref 39–117)
BUN: 19 mg/dL (ref 6–23)
CO2: 31 meq/L (ref 19–32)
Calcium: 9.5 mg/dL (ref 8.4–10.5)
Chloride: 104 meq/L (ref 96–112)
Creatinine, Ser: 0.65 mg/dL (ref 0.40–1.20)
GFR: 83.13 mL/min (ref >60.00)
Glucose, Bld: 109 mg/dL — ABNORMAL HIGH (ref 70–99)
Potassium: 4.2 meq/L (ref 3.5–5.1)
Sodium: 140 meq/L (ref 135–145)
Total Bilirubin: 0.5 mg/dL (ref 0.2–1.2)
Total Protein: 6.6 g/dL (ref 6.0–8.3)

## 2023-05-30 LAB — LIPID PANEL
Cholesterol: 136 mg/dL (ref 0–200)
HDL: 46.1 mg/dL (ref >39.00)
LDL Cholesterol: 59 mg/dL (ref 0–99)
NonHDL: 89.44
Total CHOL/HDL Ratio: 3
Triglycerides: 153 mg/dL — ABNORMAL HIGH (ref 0.0–149.0)
VLDL: 30.6 mg/dL (ref 0.0–40.0)

## 2023-05-30 LAB — IRON: Iron: 73 ug/dL (ref 42–145)

## 2023-05-30 LAB — TSH: TSH: 3.09 u[IU]/mL (ref 0.35–5.50)

## 2023-05-30 LAB — HEMOGLOBIN A1C: Hgb A1c MFr Bld: 6.1 % (ref 4.6–6.5)

## 2023-05-30 LAB — FERRITIN: Ferritin: 21.2 ng/mL (ref 10.0–291.0)

## 2023-05-30 NOTE — Progress Notes (Signed)
Subjective:   Sierra Sparks is a 83 y.o. female who presents for Medicare Annual (Subsequent) preventive examination.  Visit Complete: In person  Patient Medicare AWV questionnaire was completed by the patient on (not done); I have confirmed that all information answered by patient is correct and no changes since this date.  Cardiac Risk Factors include: advanced age (>62men, >38 women);dyslipidemia;hypertension     Objective:    Today's Vitals   05/30/23 0844 05/30/23 0925 05/30/23 0929  BP: (!) 148/86  (!) 140/86  Weight: 175 lb (79.4 kg)    Height: 5' 1.25" (1.556 m)    PainSc:  8     Body mass index is 32.8 kg/m.     05/30/2023   10:05 AM 05/20/2022   11:13 AM 08/25/2020    8:54 AM 08/24/2020   12:06 PM 05/07/2017    7:43 AM 04/21/2017   10:27 AM 04/14/2017    6:18 PM  Advanced Directives  Does Patient Have a Medical Advance Directive? Yes Yes Yes Yes Yes Yes No  Type of Estate agent of [place];Living will Healthcare Power of [place];Living will Healthcare Power of [place];Living will Healthcare Power of [place];Living will Healthcare Power of [place];Living will Healthcare Power of [place];Living will   Does patient want to make changes to medical advance directive?  No - Patient declined No - Patient declined  No - Patient declined    Copy of Healthcare Power of Attorney in Chart? Yes - validated most recent copy scanned in chart (See row information) Yes - validated most recent copy scanned in chart (See row information) No - copy requested No - copy requested No - copy requested      Current Medications (verified) Outpatient Encounter Medications as of 05/30/2023  Medication Sig   amLODipine (NORVASC) 5 MG tablet Take 1 tablet by mouth once daily   Ascorbic Acid (VITAMIN C) 500 MG CAPS Take 500 mg by mouth daily.   Calcium Carbonate-Vit D-Min (CALCIUM 1200) 1200-1000 MG-UNIT CHEW Chew 600 mg by mouth 2 (two) times daily.   ezetimibe  (ZETIA) 10 MG tablet Take 1 tablet by mouth once daily   FLUoxetine (PROZAC) 20 MG capsule Take 1 capsule by mouth once daily   fluticasone (FLONASE) 50 MCG/ACT nasal spray Place 2 sprays into both nostrils daily.   hydrocortisone (ANUSOL-HC) 25 MG suppository Place 1 suppository (25 mg total) rectally at bedtime.   iron polysaccharides (FERREX 150) 150 MG capsule Take 1 capsule by mouth twice daily   Melatonin 10 MG TABS Take 10 mg by mouth daily.   meloxicam (MOBIC) 7.5 MG tablet Take 1 tablet (7.5 mg total) by mouth daily.   Multiple Vitamin (MULTIVITAMIN) tablet Take 1 tablet by mouth daily.   rosuvastatin (CRESTOR) 5 MG tablet Take 1 tablet by mouth once daily   No facility-administered encounter medications on file as of 05/30/2023.    Allergies (verified) Patient has no known allergies.   History: Past Medical History:  Diagnosis Date   Allergic rhinitis, cause unspecified    Allergy    Anemia    Anxiety state, unspecified    Arthritis    Cataract    forming    Colon polyps    Depressive disorder, not elsewhere classified    Diffuse cystic mastopathy    Disorder of bone and cartilage, unspecified    Diverticulosis    External hemorrhoids    Gallstones    GERD (gastroesophageal reflux disease)    Glaucoma  narrow angle thatwas corrected with laser surgery    Osteoarthrosis, unspecified whether generalized or localized, unspecified site    hands   Osteoporosis    Pure hypercholesterolemia    Thyroid disease    years past- no meds currently    Unspecified essential hypertension    Unspecified hemorrhoids without mention of complication    Past Surgical History:  Procedure Laterality Date   BREAST BIOPSY Left 08/11/2020   11:00 3cmfn Q clip u/s bx papilloma   BREAST EXCISIONAL BIOPSY Left 08/25/2020   surgical exc papilloma with tag placement tag no.[id number]   BREAST LUMPECTOMY WITH RADIO FREQUENCY LOCALIZER Left 08/25/2020   Procedure: BREAST LUMPECTOMY WITH  RADIO FREQUENCY LOCALIZER;  Surgeon: Sung Amabile, DO;  Location: [facility] ORS;  Service: General;  Laterality: Left;   BUNIONECTOMY Left 10/1999   CESAREAN SECTION  1981   CHOLECYSTECTOMY     COLONOSCOPY  01/2002   polyp; hemorrhoids;diverticulosis   COLONOSCOPY  2013   diverticulosis   DEXA  06/2000   osteopenia   DEXA  04/2003   Stable   HAND SURGERY Left    DIP fusion-left   HEMORRHOID SURGERY     POLYPECTOMY     TONSILLECTOMY     childhood   Family History  Problem Relation Age of Onset   Stroke Mother    Coronary artery disease Mother 30   Hypertension Mother    Hyperlipidemia Mother    Arthritis Father    Lung cancer Father        + smoker   Hypertension Sister    Migraines Sister    Colon cancer Brother        ? primary colon   Liver cancer Brother    Cancer Brother        type unknown   Stroke Maternal Grandmother        hemorrhagic   Cancer Maternal Grandmother        female cancer type unknown   Bone cancer Paternal Uncle    Esophageal cancer Neg Hx    Rectal cancer Neg Hx    Stomach cancer Neg Hx    Breast cancer Neg Hx    Colon polyps Neg Hx    Social History   Socioeconomic History   Marital status: Married    Spouse name: Not on file   Number of children: 2   Years of education: Not on file   Highest education level: Not on file  Occupational History    Employer: OTHER    Comment: [employer]   Occupation: retired Runner, broadcasting/film/video  Tobacco Use   Smoking status: Never   Smokeless tobacco: Never  Vaping Use   Vaping status: Never Used  Substance and Sexual Activity   Alcohol use: Yes    Comment: Occasional-wine, 3 per week   Drug use: No   Sexual activity: Yes    Partners: Male  Other Topics Concern   Not on file  Social History Narrative   Married      2 children      Works for [employer]   Social Drivers of Health   Financial Resource Strain: Low Risk  (05/30/2023)   Overall Financial Resource Strain (CARDIA)    Difficulty of  Paying Living Expenses: Not hard at all  Food Insecurity: No Food Insecurity (05/30/2023)   Hunger Vital Sign    Worried About Running Out of Food in the Last Year: Never true    Ran Out of Food in the Last  Year: Never true  Transportation Needs: No Transportation Needs (05/30/2023)   PRAPARE - Administrator, Civil Service (Medical): No    Lack of Transportation (Non-Medical): No  Physical Activity: Inactive (05/30/2023)   Exercise Vital Sign    Days of Exercise per Week: 0 days    Minutes of Exercise per Session: 0 min  Stress: No Stress Concern Present (05/30/2023)   [employer] of Occupational Health - Occupational Stress Questionnaire    Feeling of Stress : Only a little  Social Connections: Moderately Integrated (05/30/2023)   Social Connection and Isolation Panel [NHANES]    Frequency of Communication with Friends and Family: More than three times a week    Frequency of Social Gatherings with Friends and Family: Once a week    Attends Religious Services: More than 4 times per year    Active Member of [employer] or Organizations: No    Attends Engineer, structural: Never    Marital Status: Married    Tobacco Counseling Counseling given: Not Answered  Clinical Intake:  Pre-visit preparation completed: Yes  Pain : 0-10 Pain Score: 8  Pain Type: Chronic pain Pain Location: Back (lower back and left hip) Pain Orientation: Left, Lower Pain Descriptors / Indicators: Aching Pain Onset: More than a month ago Pain Frequency: Intermittent Pain Relieving Factors: Tylenol, sitting  Pain Relieving Factors: Tylenol, sitting  BMI - recorded: 32.8 Nutritional Status: BMI > 30  Obese Nutritional Risks: None Diabetes: No      Activities of Daily Living    05/30/2023    8:42 AM  In your present state of health, do you have any difficulty performing the following activities:  Hearing? 1  Vision? 0  Difficulty concentrating or making decisions? 0  Walking  or climbing stairs? 1  Dressing or bathing? 0  Doing errands, shopping? 0  Preparing Food and eating ? N  Using the Toilet? N  In the past six months, have you accidently leaked urine? N  Do you have problems with loss of bowel control? N  Managing your Medications? N  Managing your Finances? N  Housekeeping or managing your Housekeeping? N    Patient Care Team: Tower, Audrie Gallus, MD as PCP - General Tower, Audrie Gallus, MD as Consulting Physician (Family Medicine) Lemar Livings, Merrily Pew, MD (General Surgery) Pa, [facility] (Optometry)  Indicate any recent Medical Services you may have received from other than [facility] providers in the past year (date may be approximate).     Assessment:   This is a routine wellness examination for [facility] [place].  Hearing/Vision screen Hearing Screening - Comments:: Pt says she cannot hear in rt ear but sufficient in left ear Vision Screening - Comments:: Pt says vision good with glasses [facility]   Goals Addressed             This Visit's Progress    Patient Stated       Pt would like to minimize pain she is experiencing       Depression Screen    05/30/2023    8:33 AM 12/17/2022   11:32 AM 05/20/2022   11:13 AM 05/08/2022   12:13 PM 12/03/2021   10:30 AM 05/18/2021   10:24 AM 05/16/2020   10:49 AM  PHQ 2/9 Scores  PHQ - 2 Score 1 0 0 0 0 0 0  PHQ- 9 Score  2 0 2       Fall Risk    05/30/2023    9:30  AM 12/17/2022   11:32 AM 05/20/2022   11:15 AM 05/08/2022   12:13 PM 12/03/2021   10:30 AM  Fall Risk   Falls in the past year? 0 1 1 1  0  Number falls in past yr: 0 1 1 1    Injury with Fall? 0 0 0 0   Risk for fall due to : No Fall Risks History of fall(s) Impaired vision;History of fall(s) History of fall(s)   Follow up Falls prevention discussed;Education provided Falls evaluation completed Falls prevention discussed Falls evaluation completed Falls evaluation completed    MEDICARE RISK AT HOME: Medicare Risk at Home Any stairs in or around  the home?: No If so, are there any without handrails?: No Home free of loose throw rugs in walkways, pet beds, electrical cords, etc?: Yes Adequate lighting in your home to reduce risk of falls?: Yes Life alert?: No Use of a cane, walker or w/c?: Yes (cane) Grab bars in the bathroom?: Yes Shower chair or bench in shower?: Yes Elevated toilet seat or a handicapped toilet?: Yes  TIMED UP AND GO:  Was the test performed?  Yes  Length of time to ambulate 10 feet: 25 sec Gait slow and steady with assistive device    Cognitive Function:    04/04/2017    2:16 PM 02/21/2016   10:06 AM  MMSE - Mini Mental State Exam  Orientation to time 5 5  Orientation to Place 5 5  Registration 3 3  Attention/ Calculation 0 0  Recall 3 3  Language- name 2 objects 0 0  Language- repeat 1 1  Language- follow 3 step command 3 3  Language- read & follow direction 0 0  Write a sentence 0 0  Copy design 0 0  Total score 20 20        05/30/2023    8:45 AM 05/20/2022   11:16 AM  6CIT Screen  What Year? 0 points 0 points  What month? 0 points 0 points  What time? 0 points 0 points  Count back from 20 0 points 0 points  Months in reverse 0 points 0 points  Repeat phrase 6 points 0 points  Total Score 6 points 0 points    Immunizations Immunization History  Administered Date(s) Administered   Influenza Split 02/13/2011, 04/14/2012   Influenza Whole 03/13/2006, 03/26/2007, 01/14/2008, 02/16/2009, 02/28/2010   Influenza, High Dose Seasonal PF 12/30/2019, 01/27/2021   Influenza,inj,Quad PF,6+ Mos 03/17/2013, 03/28/2014, 02/03/2015, 02/21/2016, 02/25/2017, 04/02/2018   Influenza-Unspecified 01/28/2019   PFIZER(Purple Top)SARS-COV-2 Vaccination 06/05/2019, 06/29/2019, 12/30/2019, 08/02/2020   PNEUMOCOCCAL CONJUGATE-20 11/10/2020   Pneumococcal Conjugate-13 09/08/2013   Pneumococcal Polysaccharide-23 02/28/2010   Td 10/27/1996, 11/13/2005, 02/21/2016   Zoster Recombinant(Shingrix) 11/10/2020,  01/27/2021   Zoster, Live 05/24/2010    TDAP status: Up to date  Flu Vaccine status: Up to date Pt says given at pharmacy   Pneumococcal vaccine status: Up to date  Covid-19 vaccine status: Completed vaccines  Qualifies for Shingles Vaccine? Yes   Zostavax completed Yes   Shingrix Completed?: Yes  Screening Tests Health Maintenance  Topic Date Due   COVID-19 Vaccine (5 - 2024-25 season) 12/15/2022   INFLUENZA VACCINE  07/14/2023 (Originally 11/14/2022)   MAMMOGRAM  08/28/2023   Medicare Annual Wellness (AWV)  05/29/2024   DTaP/Tdap/Td (4 - Tdap) 02/20/2026   Pneumonia Vaccine 88+ Years old  Completed   DEXA SCAN  Completed   Zoster Vaccines- Shingrix  Completed   HPV VACCINES  Aged Out   Colonoscopy  Discontinued  Health Maintenance  Health Maintenance Due  Topic Date Due   COVID-19 Vaccine (5 - 2024-25 season) 12/15/2022    Colorectal cancer screening: No longer required.   Mammogram status: No longer required due to age.  Bone Density status: Completed 06/19/2021. Results reflect: Bone density results: OSTEOPENIA. Repeat every 3-5 years.  Lung Cancer Screening: (Low Dose CT Chest recommended if Age 58-80 years, 20 pack-year currently smoking OR have quit w/in 15years.) does not qualify.   Lung Cancer Screening Referral: no  Additional Screening:  Hepatitis C Screening: does not qualify; Completed no  Vision Screening: Recommended annual ophthalmology exams for early detection of glaucoma and other disorders of the eye. Is the patient up to date with their annual eye exam?  Yes  Who is the provider or what is the name of the office in which the patient attends annual eye exams? [facility] If pt is not established with a provider, would they like to be referred to a provider to establish care? No .   Dental Screening: Recommended annual dental exams for proper oral hygiene  Diabetic Foot Exam: n/a  Community Resource Referral / Chronic Care  Management: CRR required this visit?  No   CCM required this visit?  No     Plan:     I have personally reviewed and noted the following in the patient's chart:   Medical and social history Use of alcohol, tobacco or illicit drugs  Current medications and supplements including opioid prescriptions. Patient is not currently taking opioid prescriptions. Functional ability and status Nutritional status Physical activity Advanced directives List of other physicians Hospitalizations, surgeries, and ER visits in previous 12 months Vitals Screenings to include cognitive, depression, and falls Referrals and appointments  In addition, I have reviewed and discussed with patient certain preventive protocols, quality metrics, and best practice recommendations. A written personalized care plan for preventive services as well as general preventive health recommendations were provided to patient.     Sue Lush, LPN   05/15/8655   After Visit Summary: (In Person-Declined) Patient declined AVS at this time.  Nurse Notes: Pt states she is in pain this morning in back and hip (left) on pain scale at 8. She presents ambulating with a cane, that she states she recently started using. She relays she is getting an injection in her hip next week to help with pain.Her BP today a little elevated this am. She relays it runs 130's over 80s. She relays she has no concerns or questions at this time.

## 2023-05-30 NOTE — Patient Instructions (Signed)
Sierra Sparks , Thank you for taking time to come for your Medicare Wellness Visit. I appreciate your ongoing commitment to your health goals. Please review the following plan we discussed and let me know if I can assist you in the future.   Referrals/Orders/Follow-Ups/Clinician Recommendations: none  This is a list of the screening recommended for you and due dates:  Health Maintenance  Topic Date Due   COVID-19 Vaccine (5 - 2024-25 season) 12/15/2022   Flu Shot  07/14/2023*   Mammogram  08/28/2023   Medicare Annual Wellness Visit  05/29/2024   DTaP/Tdap/Td vaccine (4 - Tdap) 02/20/2026   Pneumonia Vaccine  Completed   DEXA scan (bone density measurement)  Completed   Zoster (Shingles) Vaccine  Completed   HPV Vaccine  Aged Out   Colon Cancer Screening  Discontinued  *Topic was postponed. The date shown is not the original due date.    Advanced directives: (In Chart) A copy of your advanced directives are scanned into your chart should your provider ever need it.  Next Medicare Annual Wellness Visit scheduled for next year: Yes  06/01/2024 @ 8:10am in person

## 2023-06-04 DIAGNOSIS — M5416 Radiculopathy, lumbar region: Secondary | ICD-10-CM | POA: Diagnosis not present

## 2023-06-09 DIAGNOSIS — H43813 Vitreous degeneration, bilateral: Secondary | ICD-10-CM | POA: Diagnosis not present

## 2023-06-09 DIAGNOSIS — H2513 Age-related nuclear cataract, bilateral: Secondary | ICD-10-CM | POA: Diagnosis not present

## 2023-06-09 DIAGNOSIS — H52223 Regular astigmatism, bilateral: Secondary | ICD-10-CM | POA: Diagnosis not present

## 2023-06-12 ENCOUNTER — Encounter: Payer: Self-pay | Admitting: Family Medicine

## 2023-06-12 ENCOUNTER — Ambulatory Visit (INDEPENDENT_AMBULATORY_CARE_PROVIDER_SITE_OTHER): Payer: Medicare PPO | Admitting: Family Medicine

## 2023-06-12 VITALS — BP 132/80 | HR 80 | Temp 97.9°F | Ht 61.0 in | Wt 174.0 lb

## 2023-06-12 DIAGNOSIS — E7800 Pure hypercholesterolemia, unspecified: Secondary | ICD-10-CM

## 2023-06-12 DIAGNOSIS — R7303 Prediabetes: Secondary | ICD-10-CM

## 2023-06-12 DIAGNOSIS — R296 Repeated falls: Secondary | ICD-10-CM | POA: Diagnosis not present

## 2023-06-12 DIAGNOSIS — F418 Other specified anxiety disorders: Secondary | ICD-10-CM

## 2023-06-12 DIAGNOSIS — I10 Essential (primary) hypertension: Secondary | ICD-10-CM

## 2023-06-12 DIAGNOSIS — D509 Iron deficiency anemia, unspecified: Secondary | ICD-10-CM | POA: Diagnosis not present

## 2023-06-12 DIAGNOSIS — E2839 Other primary ovarian failure: Secondary | ICD-10-CM | POA: Diagnosis not present

## 2023-06-12 DIAGNOSIS — M8589 Other specified disorders of bone density and structure, multiple sites: Secondary | ICD-10-CM

## 2023-06-12 DIAGNOSIS — Z0000 Encounter for general adult medical examination without abnormal findings: Secondary | ICD-10-CM

## 2023-06-12 NOTE — Progress Notes (Signed)
 Subjective:    Patient ID: Sierra Sparks, female    DOB: 1941-03-07, 83 y.o.   MRN: [id number]  HPI  Here for health maintenance exam and to review chronic medical problems   Wt Readings from Last 3 Encounters:  06/12/23 174 lb (78.9 kg)  05/30/23 175 lb (79.4 kg)  12/17/22 168 lb 2 oz (76.3 kg)   32.88 kg/m  Vitals:   06/12/23 1044 06/12/23 1114  BP: (!) 146/78 132/80  Pulse: 80   Temp: 97.9 F (36.6 C)   SpO2: 95%     Immunization History  Administered Date(s) Administered   Influenza Split 02/13/2011, 04/14/2012   Influenza Whole 03/13/2006, 03/26/2007, 01/14/2008, 02/16/2009, 02/28/2010   Influenza, High Dose Seasonal PF 12/30/2019, 01/27/2021, 01/23/2023   Influenza,inj,Quad PF,6+ Mos 03/17/2013, 03/28/2014, 02/03/2015, 02/21/2016, 02/25/2017, 04/02/2018   Influenza-Unspecified 01/28/2019   PFIZER(Purple Top)SARS-COV-2 Vaccination 06/05/2019, 06/29/2019, 12/30/2019, 08/02/2020   PNEUMOCOCCAL CONJUGATE-20 11/10/2020   Pneumococcal Conjugate-13 09/08/2013   Pneumococcal Polysaccharide-23 02/28/2010   Td 10/27/1996, 11/13/2005, 02/21/2016   Zoster Recombinant(Shingrix) 11/10/2020, 01/27/2021   Zoster, Live 05/24/2010    There are no preventive care reminders to display for this patient.  Getting injections in her back    Mammogram 08/2022  Self breast exam-no lumps   Gyn health/ no problems    Colon cancer screening -out aged   Bone health  Dexa 06/2021 osteopenia  Had to stop alendronate in past due to GI bleed  Falls-falls on the same step into her apt over and over  Fractures-may have broken little toe  Supplements ca and D3  Last vitamin D Lab Results  Component Value Date   VD25OH 53 09/01/2013    Exercise  Can pedal bike / and treadmill    Mood    06/12/2023   10:50 AM 05/30/2023    8:33 AM 12/17/2022   11:32 AM 05/20/2022   11:13 AM 05/08/2022   12:13 PM  Depression screen PHQ 2/9  Decreased Interest 0 0 0 0 0  Down, Depressed,  Hopeless 0 1 0 0 0  PHQ - 2 Score 0 1 0 0 0  Altered sleeping 1  1 0 1  Tired, decreased energy 1  1 0 1  Change in appetite 1  0 0 0  Feeling bad or failure about yourself  0  0 0 0  Trouble concentrating 0  0 0 0  Moving slowly or fidgety/restless 0  0 0 0  Suicidal thoughts 0  0 0 0  PHQ-9 Score 3  2 0 2  Difficult doing work/chores Not difficult at all  Not difficult at all Not difficult at all Not difficult at all  Fluoxetine 20 mg daily  HTN bp is stable today  No cp or palpitations or headaches or edema  No side effects to medicines  BP Readings from Last 3 Encounters:  06/12/23 132/80  05/30/23 (!) 140/86  12/17/22 132/80    Amlodipine 5 mg daily  No issues at home   Lab Results  Component Value Date   NA 140 05/30/2023   K 4.2 05/30/2023   CO2 31 05/30/2023   GLUCOSE 109 (H) 05/30/2023   BUN 19 05/30/2023   CREATININE 0.65 05/30/2023   CALCIUM 9.5 05/30/2023   GFR 83.13 05/30/2023   GFRNONAA 75.99 02/26/2010   Lab Results  Component Value Date   ALT 16 05/30/2023   AST 20 05/30/2023   ALKPHOS 86 05/30/2023   BILITOT 0.5 05/30/2023     Glucose  Lab Results  Component Value Date   HGBA1C 6.1 05/30/2023   In prediabetic range   Hyperlipidemia Lab Results  Component Value Date   CHOL 136 05/30/2023   CHOL 154 05/23/2022   CHOL 150 01/08/2022   Lab Results  Component Value Date   HDL 46.10 05/30/2023   HDL 50.00 05/23/2022   HDL 47.00 01/08/2022   Lab Results  Component Value Date   LDLCALC 59 05/30/2023   LDLCALC 74 05/23/2022   LDLCALC 72 01/08/2022   Lab Results  Component Value Date   TRIG 153.0 (H) 05/30/2023   TRIG 149.0 05/23/2022   TRIG 154.0 (H) 01/08/2022   Lab Results  Component Value Date   CHOLHDL 3 05/30/2023   CHOLHDL 3 05/23/2022   CHOLHDL 3 01/08/2022   Lab Results  Component Value Date   LDLDIRECT 143.2 08/05/2012   Crestor 5 mg daily  Zetia 10 mg daily   Lab Results  Component Value Date   WBC 3.9 (L)  05/30/2023   HGB 13.2 05/30/2023   HCT 40.0 05/30/2023   MCV 95.0 05/30/2023   PLT 256.0 05/30/2023   Lab Results  Component Value Date   TSH 3.09 05/30/2023   Lab Results  Component Value Date   IRON 73 05/30/2023   TIBC 502 (H) 06/14/2021   FERRITIN 21.2 05/30/2023        Patient Active Problem List   Diagnosis Date Noted   Falls frequently 06/12/2023   Peripheral vertigo 12/17/2022   Prediabetes 05/23/2022   Irregular heart beat 06/22/2021   Colon cancer screening 05/18/2021   Encounter for screening mammogram for breast cancer 03/31/2017   Hearing loss in right ear 08/30/2016   Anal fissure 03/06/2016   Internal hemorrhoids 02/26/2016   Routine general medical examination at a health care facility 02/03/2015   Estrogen deficiency 02/03/2015   Encounter for Medicare annual wellness exam 08/12/2012   Glaucoma 08/12/2012   History of colon polyps 05/15/2011   Anemia 05/15/2011   Allergic rhinitis 03/02/2008   Osteopenia 01/05/2007   HYPERCHOLESTEROLEMIA 10/24/2006   Depression with anxiety 10/24/2006   Essential hypertension 10/24/2006   HEMORRHOIDS 10/24/2006   FIBROCYSTIC BREAST DISEASE 10/24/2006   OSTEOARTHRITIS 10/24/2006   Past Medical History:  Diagnosis Date   Allergic rhinitis, cause unspecified    Allergy    Anemia    Anxiety state, unspecified    Arthritis    Cataract    forming    Colon polyps    Depressive disorder, not elsewhere classified    Diffuse cystic mastopathy    Disorder of bone and cartilage, unspecified    Diverticulosis    External hemorrhoids    Gallstones    GERD (gastroesophageal reflux disease)    Glaucoma    narrow angle thatwas corrected with laser surgery    Osteoarthrosis, unspecified whether generalized or localized, unspecified site    hands   Osteoporosis    Pure hypercholesterolemia    Thyroid disease    years past- no meds currently    Unspecified essential hypertension    Unspecified hemorrhoids without  mention of complication    Past Surgical History:  Procedure Laterality Date   BREAST BIOPSY Left 08/11/2020   11:00 3cmfn Q clip u/s bx papilloma   BREAST EXCISIONAL BIOPSY Left 08/25/2020   surgical exc papilloma with tag placement tag no.[id number]   BREAST LUMPECTOMY WITH RADIO FREQUENCY LOCALIZER Left 08/25/2020   Procedure: BREAST LUMPECTOMY WITH RADIO FREQUENCY LOCALIZER;  Surgeon: Sung Amabile, DO;  Location: [facility] ORS;  Service: General;  Laterality: Left;   BUNIONECTOMY Left 10/1999   CESAREAN SECTION  1981   CHOLECYSTECTOMY     COLONOSCOPY  01/2002   polyp; hemorrhoids;diverticulosis   COLONOSCOPY  2013   diverticulosis   DEXA  06/2000   osteopenia   DEXA  04/2003   Stable   HAND SURGERY Left    DIP fusion-left   HEMORRHOID SURGERY     POLYPECTOMY     TONSILLECTOMY     childhood   Social History   Tobacco Use   Smoking status: Never   Smokeless tobacco: Never  Vaping Use   Vaping status: Never Used  Substance Use Topics   Alcohol use: Yes    Comment: Occasional-wine, 3 per week   Drug use: No   Family History  Problem Relation Age of Onset   Stroke Mother    Coronary artery disease Mother 29   Hypertension Mother    Hyperlipidemia Mother    Arthritis Father    Lung cancer Father        + smoker   Hypertension Sister    Migraines Sister    Colon cancer Brother        ? primary colon   Liver cancer Brother    Cancer Brother        type unknown   Stroke Maternal Grandmother        hemorrhagic   Cancer Maternal Grandmother        female cancer type unknown   Bone cancer Paternal Uncle    Esophageal cancer Neg Hx    Rectal cancer Neg Hx    Stomach cancer Neg Hx    Breast cancer Neg Hx    Colon polyps Neg Hx    No Known Allergies Current Outpatient Medications on File Prior to Visit  Medication Sig Dispense Refill   amLODipine (NORVASC) 5 MG tablet Take 1 tablet by mouth once daily 90 tablet 1   Ascorbic Acid (VITAMIN C) 500 MG CAPS Take 500 mg by  mouth daily.     Calcium Carbonate-Vit D-Min (CALCIUM 1200) 1200-1000 MG-UNIT CHEW Chew 600 mg by mouth 2 (two) times daily.     ezetimibe (ZETIA) 10 MG tablet Take 1 tablet by mouth once daily 90 tablet 0   FLUoxetine (PROZAC) 20 MG capsule Take 1 capsule by mouth once daily 90 capsule 1   fluticasone (FLONASE) 50 MCG/ACT nasal spray Place 2 sprays into both nostrils daily. 16 g 6   gabapentin (NEURONTIN) 100 MG capsule Take 100 mg by mouth 3 (three) times daily.     hydrocortisone (ANUSOL-HC) 25 MG suppository Place 1 suppository (25 mg total) rectally at bedtime. 12 suppository 1   iron polysaccharides (FERREX 150) 150 MG capsule Take 1 capsule by mouth twice daily 180 capsule 2   Melatonin 10 MG TABS Take 10 mg by mouth daily.     meloxicam (MOBIC) 7.5 MG tablet Take 1 tablet (7.5 mg total) by mouth daily. 90 tablet 3   Multiple Vitamin (MULTIVITAMIN) tablet Take 1 tablet by mouth daily.     rosuvastatin (CRESTOR) 5 MG tablet Take 1 tablet by mouth once daily 90 tablet 1   No current facility-administered medications on file prior to visit.    Review of Systems  Constitutional:  Negative for activity change, appetite change, fatigue, fever and unexpected weight change.  HENT:  Negative for congestion, ear pain, rhinorrhea, sinus pressure and sore throat.   Eyes:  Negative for  pain, redness and visual disturbance.  Respiratory:  Negative for cough, shortness of breath and wheezing.   Cardiovascular:  Negative for chest pain and palpitations.  Gastrointestinal:  Negative for abdominal pain, blood in stool, constipation and diarrhea.  Endocrine: Negative for polydipsia and polyuria.  Genitourinary:  Negative for dysuria, frequency and urgency.  Musculoskeletal:  Positive for arthralgias and back pain. Negative for myalgias.  Skin:  Negative for pallor and rash.  Allergic/Immunologic: Negative for environmental allergies.  Neurological:  Negative for dizziness, syncope and headaches.   Hematological:  Negative for adenopathy. Does not bruise/bleed easily.  Psychiatric/Behavioral:  Negative for decreased concentration and dysphoric mood. The patient is not nervous/anxious.        Objective:   Physical Exam Constitutional:      General: She is not in acute distress.    Appearance: Normal appearance. She is well-developed. She is obese. She is not ill-appearing or diaphoretic.  HENT:     Head: Normocephalic and atraumatic.     Right Ear: Tympanic membrane, ear canal and external ear normal.     Left Ear: Tympanic membrane, ear canal and external ear normal.     Nose: Nose normal. No congestion.     Mouth/Throat:     Mouth: Mucous membranes are moist.     Pharynx: Oropharynx is clear. No posterior oropharyngeal erythema.  Eyes:     General: No scleral icterus.    Extraocular Movements: Extraocular movements intact.     Conjunctiva/sclera: Conjunctivae normal.     Pupils: Pupils are equal, round, and reactive to light.  Neck:     Thyroid: No thyromegaly.     Vascular: No carotid bruit or JVD.  Cardiovascular:     Rate and Rhythm: Normal rate and regular rhythm.     Pulses: Normal pulses.     Heart sounds: Normal heart sounds.     No gallop.  Pulmonary:     Effort: Pulmonary effort is normal. No respiratory distress.     Breath sounds: Normal breath sounds. No wheezing.     Comments: Good air exch Chest:     Chest wall: No tenderness.  Abdominal:     General: Bowel sounds are normal. There is no distension or abdominal bruit.     Palpations: Abdomen is soft. There is no mass.     Tenderness: There is no abdominal tenderness.     Hernia: No hernia is present.  Genitourinary:    Comments: Breast exam: No mass, nodules, thickening, tenderness, bulging, retraction, inflamation, nipple discharge or skin changes noted.  No axillary or clavicular LA.     Musculoskeletal:        General: No tenderness. Normal range of motion.     Cervical back: Normal range of  motion and neck supple. No rigidity. No muscular tenderness.     Right lower leg: No edema.     Left lower leg: No edema.     Comments: No kyphosis   Lymphadenopathy:     Cervical: No cervical adenopathy.  Skin:    General: Skin is warm and dry.     Coloration: Skin is not pale.     Findings: No erythema or rash.     Comments: Solar lentigines diffusely Some sks   Neurological:     Mental Status: She is alert. Mental status is at baseline.     Cranial Nerves: No cranial nerve deficit.     Motor: No abnormal muscle tone.     Coordination: Coordination normal.  Gait: Gait normal.     Deep Tendon Reflexes: Reflexes are normal and symmetric. Reflexes normal.  Psychiatric:        Mood and Affect: Mood normal.        Cognition and Memory: Cognition and memory normal.           Assessment & Plan:   Problem List Items Addressed This Visit       Cardiovascular and Mediastinum   Essential hypertension   bp in fair control at this time  BP Readings from Last 1 Encounters:  06/12/23 132/80   No changes needed Most recent labs reviewed  Disc lifstyle change with low sodium diet and exercise  Plan to continue amlodipine 5 mg daily          Musculoskeletal and Integument   Osteopenia   Dexa ordered/ due next mo  Discussed fall prevention, supplements and exercise for bone density   Some falls - ref to PT for balance and gait training   Encouraged more strength building exercise         Other   Routine general medical examination at a health care facility - Primary   Reviewed health habits including diet and exercise and skin cancer prevention Reviewed appropriate screening tests for age  Also reviewed health mt list, fam hx and immunization status , as well as social and family history   See HPI Labs reviewed and ordered Health Maintenance  Topic Date Due   COVID-19 Vaccine (5 - 2024-25 season) 06/27/2024*   Mammogram  08/28/2023   Medicare Annual Wellness  Visit  05/29/2024   DTaP/Tdap/Td vaccine (4 - Tdap) 02/20/2026   Pneumonia Vaccine  Completed   Flu Shot  Completed   DEXA scan (bone density measurement)  Completed   Zoster (Shingles) Vaccine  Completed   HPV Vaccine  Aged Out   Colon Cancer Screening  Discontinued  *Topic was postponed. The date shown is not the original due date.   Dexa ordered  Discussed fall prevention, supplements and exercise for bone density  Out aged colon cancer screen  PHQ 3 in setting of health problems/ treated  PT ordered for balance/gait training       Prediabetes   Lab Results  Component Value Date   HGBA1C 6.1 05/30/2023   disc imp of low glycemic diet and wt loss to prevent DM2       HYPERCHOLESTEROLEMIA   Disc goals for lipids and reasons to control them Rev last labs with pt Rev low sat fat diet in detail   LDL of 59  Continues crestor 5 mg daily  Zetia 10 mg daily  Diet  Given info on Mediterranean diet       Falls frequently   In setting of chronic back pain   Ref to PT for balance/gait training        Relevant Orders   Ambulatory referral to Physical Therapy   Estrogen deficiency   Dexa ordered      Relevant Orders   DG Bone Density   Depression with anxiety   PHQ 3 Struggling with ome health issues Wants to continue fluoxetine 20 mg daily      Anemia   Cbc and iron levels are better  Pt would like to occational donate blood if able

## 2023-06-12 NOTE — Assessment & Plan Note (Signed)
 In setting of chronic back pain   Ref to PT for balance/gait training

## 2023-06-12 NOTE — Patient Instructions (Addendum)
 Try to get most of your carbohydrates from produce (with the exception of white potatoes) and whole grains Eat less bread/pasta/rice/snack foods/cereals/sweets and other items from the middle of the grocery store (processed carbs)  When able  Add some strength training to your routine, this is important for bone and brain health and can reduce your risk of falls and help your body use insulin properly and regulate weight  Light weights, exercise bands , and internet videos are a good way to start  Yoga (chair or regular), machines , floor exercises or a gym with machines are also good options   I put the referral in for PT for gait training/balance and fall prevention  Please let us know if you don't hear in 1-2 weeks     You have an order for:  []   2D Mammogram  []   3D Mammogram  [x]   Bone Density     Please call for appointment:   []   [facility]  [address] [place] [address]  [phone]  [x]   [facility])   [address]  [place], [place] [address]  [phone]  []   [facility]      [address] [place], [place]        [phone]         []   [facility]  [address] [place], [place]  [phone]  []  [facility]   [address]   [place], [place] [address]  [phone]  []  [facility]  [address] [place], [place] [address] [phone]    Make sure to wear two piece clothing  No lotions powders or deodorants the day of the appointment Make sure to bring picture ID and insurance card.  Bring list of medications you are currently taking including any supplements.   Schedule your screening mammogram through MyChart!   Select [place] imaging sites can now be scheduled through MyChart.  Log  into your MyChart account.  Go to 'Visit' (or 'Appointments' if  on mobile App) --> Schedule an  Appointment  Under 'Select a Reason for Visit' choose the Mammogram  Screening option.  Complete the pre-visit questions  and select the time and place that  best fits your schedule

## 2023-06-12 NOTE — Assessment & Plan Note (Signed)
 Dexa ordered

## 2023-06-12 NOTE — Assessment & Plan Note (Signed)
 Dexa ordered/ due next mo  Discussed fall prevention, supplements and exercise for bone density   Some falls - ref to PT for balance and gait training   Encouraged more strength building exercise

## 2023-06-12 NOTE — Assessment & Plan Note (Signed)
 Cbc and iron levels are better  Pt would like to occational donate blood if able

## 2023-06-12 NOTE — Assessment & Plan Note (Addendum)
 Disc goals for lipids and reasons to control them Rev last labs with pt Rev low sat fat diet in detail   LDL of 59  Continues crestor 5 mg daily  Zetia 10 mg daily  Diet  Given info on Mediterranean diet

## 2023-06-12 NOTE — Assessment & Plan Note (Signed)
 Lab Results  Component Value Date   HGBA1C 6.1 05/30/2023   disc imp of low glycemic diet and wt loss to prevent DM2

## 2023-06-12 NOTE — Assessment & Plan Note (Signed)
 Reviewed health habits including diet and exercise and skin cancer prevention Reviewed appropriate screening tests for age  Also reviewed health mt list, fam hx and immunization status , as well as social and family history   See HPI Labs reviewed and ordered Health Maintenance  Topic Date Due   COVID-19 Vaccine (5 - 2024-25 season) 06/27/2024*   Mammogram  08/28/2023   Medicare Annual Wellness Visit  05/29/2024   DTaP/Tdap/Td vaccine (4 - Tdap) 02/20/2026   Pneumonia Vaccine  Completed   Flu Shot  Completed   DEXA scan (bone density measurement)  Completed   Zoster (Shingles) Vaccine  Completed   HPV Vaccine  Aged Out   Colon Cancer Screening  Discontinued  *Topic was postponed. The date shown is not the original due date.   Dexa ordered  Discussed fall prevention, supplements and exercise for bone density  Out aged colon cancer screen  PHQ 3 in setting of health problems/ treated  PT ordered for balance/gait training

## 2023-06-12 NOTE — Assessment & Plan Note (Signed)
 bp in fair control at this time  BP Readings from Last 1 Encounters:  06/12/23 132/80   No changes needed Most recent labs reviewed  Disc lifstyle change with low sodium diet and exercise  Plan to continue amlodipine 5 mg daily

## 2023-06-12 NOTE — Assessment & Plan Note (Signed)
 PHQ 3 Struggling with ome health issues Wants to continue fluoxetine 20 mg daily

## 2023-06-27 DIAGNOSIS — M5030 Other cervical disc degeneration, unspecified cervical region: Secondary | ICD-10-CM | POA: Diagnosis not present

## 2023-07-04 ENCOUNTER — Ambulatory Visit: Admitting: Neurology

## 2023-07-04 ENCOUNTER — Other Ambulatory Visit: Payer: Self-pay

## 2023-07-04 DIAGNOSIS — R531 Weakness: Secondary | ICD-10-CM

## 2023-07-04 DIAGNOSIS — R200 Anesthesia of skin: Secondary | ICD-10-CM

## 2023-07-04 DIAGNOSIS — G5603 Carpal tunnel syndrome, bilateral upper limbs: Secondary | ICD-10-CM

## 2023-07-04 NOTE — Procedures (Addendum)
 [facility]  [address] [place], [address]  [place], [place] [address] Tel: [phone] Fax: [phone] Test Date:  07/04/2023  Patient: Sierra Sparks DOB: [birth year] Physician: Nita Sickle, DO  Sex: Female Height: 5\' 1"  Ref Phys: Luis Abed, DO  ID#: [id number]   Technician:    History: This is a 83 year old female referred for evaluation of bilateral upper extremity paresthesias and pain.  NCV & EMG Findings: Extensive electrodiagnostic testing of the right upper extremity and additional studies of the left shows:  Bilateral median sensory responses show prolonged latency (R5.3, L6.3 ms).  Bilateral ulnar sensory responses are within normal limits. Right median motor response shows prolonged latency (5.7 ms) and reduced amplitude (4.3 mV).  Left median motor response shows prolonged latency (6.5 ms).  Bilateral ulnar motor responses are within normal limits.  Chronic motor axonal loss changes are seen affecting bilateral abductor pollicis brevis muscles, without accompanying active denervation.   Impression: Bilateral median neuropathy at or distal to the wrist, consistent with a clinical diagnosis of carpal tunnel syndrome.  Overall, these findings are moderate-to-severe in degree electrically.   ___________________________ Nita Sickle, DO     Nerve Conduction Studies   Stim Site NR Peak (ms) Norm Peak (ms) O-P Amp (V) Norm O-P Amp  Left Median Anti Sensory (2nd Digit)  32 C  Wrist    *6.3 <3.8 10.3 >10  Right Median Anti Sensory (2nd Digit)  32 C  Wrist    *5.3 <3.8 14.7 >10  Left Ulnar Anti Sensory (5th Digit)  32 C  Wrist    3.2 <3.2 24.0 >5  Right Ulnar Anti Sensory (5th Digit)  32 C  Wrist    3.1 <3.2 22.2 >5     Stim Site NR Onset (ms) Norm Onset (ms) O-P Amp (mV) Norm O-P Amp Site1 Site2 Delta-0 (ms) Dist (cm) Vel (m/s) Norm Vel (m/s)  Left Median Motor (Abd Poll Brev)  32 C  Wrist    *6.5 <4.0 5.3 >5 Elbow Wrist 5.5 28.0 51 >50   Elbow    12.0  5.1         Right Median Motor (Abd Poll Brev)  32 C  Wrist    *5.7 <4.0 *4.3 >5 Elbow Wrist 5.6 29.0 52 >50  Elbow    11.3  4.1         Left Ulnar Motor (Abd Dig Minimi)  32 C  Wrist    2.6 <3.1 7.4 >7 B Elbow Wrist 3.8 21.0 55 >50  B Elbow    6.4  6.8  A Elbow B Elbow 1.9 10.0 53 >50  A Elbow    8.3  6.6         Right Ulnar Motor (Abd Dig Minimi)  32 C  Wrist    2.5 <3.1 7.6 >7 B Elbow Wrist 4.1 21.0 51 >50  B Elbow    6.6  7.2  A Elbow B Elbow 1.9 10.0 53 >50  A Elbow    8.5  6.8          Electromyography   Side Muscle Ins.Act Fibs Fasc Recrt Amp Dur Poly Activation Comment  Right 1stDorInt Nml Nml Nml Nml Nml Nml Nml Nml N/A  Right Abd Poll Brev Nml Nml Nml *1- *1+ *1+ *1+ Nml N/A  Right PronatorTeres Nml Nml Nml Nml Nml Nml Nml Nml N/A  Right Biceps Nml Nml Nml Nml Nml Nml Nml Nml N/A  Right Triceps Nml Nml Nml Nml Nml Nml  Nml Nml N/A  Right Deltoid Nml Nml Nml Nml Nml Nml Nml Nml N/A  Left 1stDorInt Nml Nml Nml Nml Nml Nml Nml Nml N/A  Left Abd Poll Brev Nml Nml Nml *1- *1+ *1+ *1+ Nml N/A  Left PronatorTeres Nml Nml Nml Nml Nml Nml Nml Nml N/A  Left Biceps Nml Nml Nml Nml Nml Nml Nml Nml N/A  Left Triceps Nml Nml Nml Nml Nml Nml Nml Nml N/A  Left Deltoid Nml Nml Nml Nml Nml Nml Nml Nml N/A      Waveforms:

## 2023-07-09 ENCOUNTER — Other Ambulatory Visit: Payer: Self-pay

## 2023-07-09 DIAGNOSIS — R202 Paresthesia of skin: Secondary | ICD-10-CM

## 2023-07-17 DIAGNOSIS — G5603 Carpal tunnel syndrome, bilateral upper limbs: Secondary | ICD-10-CM | POA: Diagnosis not present

## 2023-07-18 ENCOUNTER — Other Ambulatory Visit: Payer: Self-pay | Admitting: Family Medicine

## 2023-07-24 DIAGNOSIS — G5602 Carpal tunnel syndrome, left upper limb: Secondary | ICD-10-CM | POA: Diagnosis not present

## 2023-08-05 DIAGNOSIS — G5602 Carpal tunnel syndrome, left upper limb: Secondary | ICD-10-CM | POA: Diagnosis not present

## 2023-08-07 ENCOUNTER — Other Ambulatory Visit: Payer: Self-pay | Admitting: Family Medicine

## 2023-08-07 DIAGNOSIS — Z1231 Encounter for screening mammogram for malignant neoplasm of breast: Secondary | ICD-10-CM

## 2023-08-12 ENCOUNTER — Other Ambulatory Visit: Payer: Self-pay | Admitting: Family Medicine

## 2023-08-22 DIAGNOSIS — M5416 Radiculopathy, lumbar region: Secondary | ICD-10-CM | POA: Diagnosis not present

## 2023-08-26 DIAGNOSIS — M79645 Pain in left finger(s): Secondary | ICD-10-CM | POA: Diagnosis not present

## 2023-08-26 DIAGNOSIS — G5602 Carpal tunnel syndrome, left upper limb: Secondary | ICD-10-CM | POA: Diagnosis not present

## 2023-08-28 ENCOUNTER — Inpatient Hospital Stay
Admission: RE | Admit: 2023-08-28 | Discharge: 2023-08-28 | Disposition: A | Discharge status: Home / Self Care | Payer: Self-pay | Source: Ambulatory Visit | Attending: Neurosurgery | Admitting: Neurosurgery

## 2023-08-28 ENCOUNTER — Other Ambulatory Visit: Payer: Self-pay | Admitting: Family Medicine

## 2023-08-28 DIAGNOSIS — Z049 Encounter for examination and observation for unspecified reason: Secondary | ICD-10-CM

## 2023-09-01 ENCOUNTER — Ambulatory Visit
Admission: RE | Admit: 2023-09-01 | Discharge: 2023-09-01 | Disposition: A | Discharge status: Home / Self Care | Source: Ambulatory Visit | Attending: Family Medicine | Admitting: Family Medicine

## 2023-09-01 DIAGNOSIS — Z1231 Encounter for screening mammogram for malignant neoplasm of breast: Secondary | ICD-10-CM | POA: Diagnosis not present

## 2023-09-04 ENCOUNTER — Ambulatory Visit: Payer: Self-pay | Admitting: Family Medicine

## 2023-09-05 ENCOUNTER — Other Ambulatory Visit: Payer: Self-pay | Admitting: Family Medicine

## 2023-09-18 ENCOUNTER — Other Ambulatory Visit

## 2023-09-19 NOTE — Progress Notes (Signed)
 Referring Physician:  Randal Bury, MD [address] [place],  [place] [address]  Primary Physician:  Tower, Manley Seeds, MD  History of Present Illness: 09/23/2023 [name] is here today with a chief complaint of worsening lower back and leg pain.  She experiences pain across her lower back that radiates to the front and back of her legs, not extending below the knees. The pain is equally severe in both her back and legs at times and has worsened recently. Physical therapy and injections have not provided relief, and therapy worsened her symptoms. Hip injections were also ineffective.  She can walk short distances within her house and manages grocery shopping with difficulty, using a cane for support. Walking longer distances, such as 300 yards, is problematic. Prolonged walking or standing exacerbates her back and leg pain, which improves with rest but recurs upon standing. There is no numbness or tingling in her legs, although prolonged standing can cause a sensation of numbness or 'nervy' feelings.   This has been ongoing for 2 years and is as bad as 7 out of 10.  Sitting and standing make it worse. Bowel/Bladder Dysfunction: none  Conservative measures:  Physical therapy: has participated in PT at [facility] from 04/23/23 to 04/30/23, she was discharged. Made the pain worse Multimodal medical therapy including regular antiinflammatories: Gabapentin, Meloxicam  Injections:  06/04/2023 L3-L4 ESI (no relief)  Past Surgery: no spinal surgeries  Sierra Sparks has no symptoms of cervical myelopathy.  The symptoms are causing a significant impact on the patient's life.   I have utilized the care everywhere function in epic to review the outside records available from external health systems.  Review of Systems:  A 10 point review of systems is negative, except for the pertinent positives and negatives detailed in the HPI.  Past Medical History: Past  Medical History:  Diagnosis Date   Allergic rhinitis, cause unspecified    Allergy    Anemia    Anxiety state, unspecified    Arthritis    Cataract    forming    Colon polyps    Depressive disorder, not elsewhere classified    Diffuse cystic mastopathy    Disorder of bone and cartilage, unspecified    Diverticulosis    External hemorrhoids    Gallstones    GERD (gastroesophageal reflux disease)    Glaucoma    narrow angle thatwas corrected with laser surgery    Osteoarthrosis, unspecified whether generalized or localized, unspecified site    hands   Osteoporosis    Pure hypercholesterolemia    Thyroid  disease    years past- no meds currently    Unspecified essential hypertension    Unspecified hemorrhoids without mention of complication     Past Surgical History: Past Surgical History:  Procedure Laterality Date   BREAST BIOPSY Left 08/11/2020   11:00 3cmfn Q clip u/s bx papilloma   BREAST EXCISIONAL BIOPSY Left 08/25/2020   surgical exc papilloma with tag placement tag no.[id number]   BREAST LUMPECTOMY WITH RADIO FREQUENCY LOCALIZER Left 08/25/2020   Procedure: BREAST LUMPECTOMY WITH RADIO FREQUENCY LOCALIZER;  Surgeon: Conrado Delay, DO;  Location: [facility] ORS;  Service: General;  Laterality: Left;   BUNIONECTOMY Left 10/1999   CESAREAN SECTION  1981   CHOLECYSTECTOMY     COLONOSCOPY  01/2002   polyp; hemorrhoids;diverticulosis   COLONOSCOPY  2013   diverticulosis   DEXA  06/2000   osteopenia   DEXA  04/2003   Stable  HAND SURGERY Left    DIP fusion-left   HEMORRHOID SURGERY     POLYPECTOMY     TONSILLECTOMY     childhood    Allergies: Allergies as of 09/23/2023   (No Known Allergies)    Medications:  Current Outpatient Medications:    amLODipine  (NORVASC ) 5 MG tablet, Take 1 tablet by mouth once daily, Disp: 90 tablet, Rfl: 1   Ascorbic Acid (VITAMIN C) 500 MG CAPS, Take 500 mg by mouth daily., Disp: , Rfl:    Calcium  Carbonate-Vit D-Min (CALCIUM  1200)  1200-1000 MG-UNIT CHEW, Chew 600 mg by mouth 2 (two) times daily., Disp: , Rfl:    ezetimibe  (ZETIA ) 10 MG tablet, Take 1 tablet by mouth once daily, Disp: 90 tablet, Rfl: 1   FLUoxetine  (PROZAC ) 20 MG capsule, Take 1 capsule by mouth once daily, Disp: 90 capsule, Rfl: 1   fluticasone  (FLONASE ) 50 MCG/ACT nasal spray, Place 2 sprays into both nostrils daily., Disp: 16 g, Rfl: 6   hydrocortisone  (ANUSOL -HC) 25 MG suppository, Place 1 suppository (25 mg total) rectally at bedtime., Disp: 12 suppository, Rfl: 1   iron  polysaccharides (FERREX 150) 150 MG capsule, Take 1 capsule by mouth twice daily, Disp: 180 capsule, Rfl: 2   Melatonin 10 MG TABS, Take 10 mg by mouth daily., Disp: , Rfl:    meloxicam  (MOBIC ) 7.5 MG tablet, Take 1 tablet (7.5 mg total) by mouth daily., Disp: 90 tablet, Rfl: 3   Multiple Vitamin (MULTIVITAMIN) tablet, Take 1 tablet by mouth daily., Disp: , Rfl:    rosuvastatin  (CRESTOR ) 5 MG tablet, Take 1 tablet by mouth once daily, Disp: 90 tablet, Rfl: 1  Social History: Social History   Tobacco Use   Smoking status: Never   Smokeless tobacco: Never  Vaping Use   Vaping status: Never Used  Substance Use Topics   Alcohol use: Yes    Comment: Occasional-wine, 3 per week   Drug use: No    Family Medical History: Family History  Problem Relation Age of Onset   Stroke Mother    Coronary artery disease Mother 82   Hypertension Mother    Hyperlipidemia Mother    Arthritis Father    Lung cancer Father        + smoker   Hypertension Sister    Migraines Sister    Colon cancer Brother        ? primary colon   Liver cancer Brother    Cancer Brother        type unknown   Stroke Maternal Grandmother        hemorrhagic   Cancer Maternal Grandmother        female cancer type unknown   Bone cancer Paternal Uncle    Esophageal cancer Neg Hx    Rectal cancer Neg Hx    Stomach cancer Neg Hx    Breast cancer Neg Hx    Colon polyps Neg Hx     Physical  Examination: Vitals:   09/23/23 1000  BP: 130/84    General: Patient is in no apparent distress. Attention to examination is appropriate.  Neck:   Supple.  Full range of motion.  Respiratory: Patient is breathing without any difficulty.   NEUROLOGICAL:     Awake, alert, oriented to person, place, and time.  Speech is clear and fluent.   Cranial Nerves: Pupils equal round and reactive to light.  Facial tone is symmetric.  Facial sensation is symmetric. Shoulder shrug is symmetric. Tongue protrusion is midline.  There is no pronator drift.  Strength: Side Biceps Triceps Deltoid Interossei Grip Wrist Ext. Wrist Flex.  R 5 5 5 5 5 5 5   L 5 5 5 5 5 5 5    Side Iliopsoas Quads Hamstring PF DF EHL  R 5 5 5 5 5 5   L 5 5 5 5 5 5    Reflexes are 1+ and symmetric at the biceps, triceps, brachioradialis, patella and achilles.   Hoffman's is absent.   Bilateral upper and lower extremity sensation is intact to light touch.    No evidence of dysmetria noted.  Gait is abnormal-she is stooped forward and has to use a cane.  FABER testing is negative bilaterally   Medical Decision Making  Imaging: MRI L spine 05/20/2023 L2-3 moderate to severe spinal canal stenosis.  Effacement of the lateral recesses at this level likely compresses the descending L3 nerve roots.  L3-4 mild to moderate spinal canal stenosis with mild to moderate left and mild right neuroforaminal narrowing.  L4-5 and L5-S1 mild to moderate spinal canal stenosis and mild bilateral neuroforaminal narrowing.  Narrowing of the lateral recesses at L1-2, L3-4, L4-5, and L5-S1 could affect the descending L2, L4, L5, and S1 nerve roots respectively.  Nerve root thickening suspected in the inferior cauda equina which can be seen in the setting of arachnoiditis.  I have personally reviewed the images and agree with the above interpretation.  Assessment and Plan: Sierra Sparks is a pleasant 83 y.o. female with chronic bilateral low back  pain with bilateral sciatica.  She has neurogenic claudication.  She has maximized conservative management.  She was discharged from physical therapy and has tried medications and injections.  She may be candidate for surgical intervention, but I would like to evaluate her bone density and her x-rays before making a formal recommendation.  She also expressed some issues with vertigo.  I will contact her primary doctor.   I spent a total of 30 minutes in this patient's care today. This time was spent reviewing pertinent records including imaging studies, obtaining and confirming history, performing a directed evaluation, formulating and discussing my recommendations, and documenting the visit within the medical record.       Thank you for involving me in the care of this patient.      Khalil Belote K. Mont Antis MD, [facility]

## 2023-09-23 ENCOUNTER — Telehealth: Payer: Self-pay | Admitting: Family Medicine

## 2023-09-23 ENCOUNTER — Ambulatory Visit: Admitting: Neurosurgery

## 2023-09-23 ENCOUNTER — Encounter: Payer: Self-pay | Admitting: Neurosurgery

## 2023-09-23 VITALS — BP 130/84 | Ht 61.0 in | Wt 171.6 lb

## 2023-09-23 DIAGNOSIS — M5441 Lumbago with sciatica, right side: Secondary | ICD-10-CM

## 2023-09-23 DIAGNOSIS — M5442 Lumbago with sciatica, left side: Secondary | ICD-10-CM | POA: Diagnosis not present

## 2023-09-23 DIAGNOSIS — G8929 Other chronic pain: Secondary | ICD-10-CM

## 2023-09-23 DIAGNOSIS — M48062 Spinal stenosis, lumbar region with neurogenic claudication: Secondary | ICD-10-CM | POA: Diagnosis not present

## 2023-09-23 DIAGNOSIS — R42 Dizziness and giddiness: Secondary | ICD-10-CM | POA: Diagnosis not present

## 2023-09-23 DIAGNOSIS — H81393 Other peripheral vertigo, bilateral: Secondary | ICD-10-CM

## 2023-09-23 NOTE — Telephone Encounter (Signed)
 Pt notified of Dr. Belva Boyden comments and verbalized understanding. Appt with Dr. Geralyn Knee scheduled tomorrow since PCP has no available appts

## 2023-09-23 NOTE — Telephone Encounter (Signed)
 Please have her come in to see first available  If more severe after hours-go to ER or if any neuro symptoms of stroke  Thanks   Sounds like vertigo for sure but want to examine to see if ear related

## 2023-09-23 NOTE — Telephone Encounter (Signed)
 Woke up last Tuesday (09/16/23) and head felt like it was spinning and she vomiting a few times that day. Pt hasn't vomited since but she has been constantly dizzy. Pt said the dizziness isn't as bad as it was on Tuesday but she is constantly dizzy. Pt said it hasn't gotten better and it just feels like the whole room is spinning. Pt said she hasn't been given any meds in the past like meclizine that she is aware of for vertigo but she was asking neuro for med and they said they didn't want to prescribe anything they wanted PCP to handle it or ENT

## 2023-09-23 NOTE — Telephone Encounter (Signed)
 Message from Dr Wenceslao Haller- sorry for the 2nd message of the day. I just saw this patient in clinic. She expressed to me some issues with vertigo. I offered her ENT referral but also offered to reach out to you to see if you wanted to evaluate her prior to ENT referral.      Would you please check in with her to see what kind of symptoms she is having  Thanks

## 2023-09-24 ENCOUNTER — Encounter: Payer: Self-pay | Admitting: Family Medicine

## 2023-09-24 ENCOUNTER — Ambulatory Visit: Admitting: Family Medicine

## 2023-09-24 VITALS — BP 130/76 | HR 88 | Temp 98.5°F | Ht 61.0 in | Wt 174.0 lb

## 2023-09-24 DIAGNOSIS — H8110 Benign paroxysmal vertigo, unspecified ear: Secondary | ICD-10-CM

## 2023-09-24 MED ORDER — MECLIZINE HCL 25 MG PO TABS: 25.0000 mg | ORAL_TABLET | Freq: Three times a day (TID) | ORAL | 0 refills | Status: AC | PRN

## 2023-09-24 MED ORDER — DIAZEPAM 2 MG PO TABS: 2.0000 mg | ORAL_TABLET | Freq: Three times a day (TID) | ORAL | 0 refills | Status: DC | PRN

## 2023-09-24 NOTE — Progress Notes (Signed)
 Sierra Petre T. Sierra Mergen, MD, CAQ Sports Medicine [facility] [address] [place], [address]  Phone: [phone]  FAX: [phone]  Sierra Sparks - 83 y.o. female  MRN [id number]  Date of Birth: 06-29-1940  Date: 09/24/2023  PCP: Clemens Curt, MD  Referral: Clemens Curt, MD  Chief Complaint  Patient presents with   Dizziness    Started Last Tuesday-States head is spinning and vomited several times that morning but none since   Subjective:   Sierra Sparks is a 83 y.o. very pleasant female patient with Body mass index is 32.88 kg/m. who presents with the following:  Patient has a history of vertigo in the past, and 8 days ago she was moving in bed and had some immediate onset of spinning and vertigo.  One week ago, felt like her head was spinning Threw up some Room spinning It is variable, maybe a little bit better today than previously  Vertigo in the past  Has been lying down Dramamine not helping at all  No fall or traumatic head injury   Review of Systems is noted in the HPI, as appropriate  Objective:   BP 130/76   Pulse 88   Temp 98.5 F (36.9 C) (Oral)   Ht 5' 1 (1.549 m)   Wt 174 lb (78.9 kg)   SpO2 96%   BMI 32.88 kg/m   GEN: No acute distress; alert,appropriate. PULM: Breathing comfortably in no respiratory distress PSYCH: Normally interactive.   Laboratory and Imaging Data:  Assessment and Plan:     ICD-10-CM   1. BPPV (benign paroxysmal positional vertigo), unspecified laterality  H81.10      Acute on chronic vertigo with exacerbation.  Sounds to be classical BPPV.  I am going to have her get some meclizine, and also a low-dose Valium.  She has not tried this recently, so I am going to have her do some 3 times daily Valium to see if this helps with her vertigo.  Medication Management during today's office visit: Meds ordered this encounter  Medications   meclizine (ANTIVERT) 25 MG  tablet    Sig: Take 1 tablet (25 mg total) by mouth 3 (three) times daily as needed for dizziness.    Dispense:  30 tablet    Refill:  0   diazepam (VALIUM) 2 MG tablet    Sig: Take 1 tablet (2 mg total) by mouth every 8 (eight) hours as needed (vertigo).    Dispense:  30 tablet    Refill:  0   There are no discontinued medications.  Orders placed today for conditions managed today: No orders of the defined types were placed in this encounter.   Disposition: No follow-ups on file.  Dr[employer] One speech-to-text software was used for transcription in this dictation.  Possible transcriptional errors can occur using Animal nutritionist.   Signed,  Ranny Bye. Sharmain Lastra, MD   Outpatient Encounter Medications as of 09/24/2023  Medication Sig   amLODipine  (NORVASC ) 5 MG tablet Take 1 tablet by mouth once daily   Ascorbic Acid (VITAMIN C) 500 MG CAPS Take 500 mg by mouth daily.   Calcium  Carbonate-Vit D-Min (CALCIUM  1200) 1200-1000 MG-UNIT CHEW Chew 600 mg by mouth 2 (two) times daily.   diazepam (VALIUM) 2 MG tablet Take 1 tablet (2 mg total) by mouth every 8 (eight) hours as needed (vertigo).   ezetimibe  (ZETIA ) 10 MG tablet Take 1 tablet by mouth once daily  FLUoxetine  (PROZAC ) 20 MG capsule Take 1 capsule by mouth once daily   fluticasone  (FLONASE ) 50 MCG/ACT nasal spray Place 2 sprays into both nostrils daily.   hydrocortisone  (ANUSOL -HC) 25 MG suppository Place 1 suppository (25 mg total) rectally at bedtime.   iron  polysaccharides (FERREX 150) 150 MG capsule Take 1 capsule by mouth twice daily   meclizine (ANTIVERT) 25 MG tablet Take 1 tablet (25 mg total) by mouth 3 (three) times daily as needed for dizziness.   Melatonin 10 MG TABS Take 10 mg by mouth daily.   meloxicam  (MOBIC ) 7.5 MG tablet Take 1 tablet (7.5 mg total) by mouth daily.   Multiple Vitamin (MULTIVITAMIN) tablet Take 1 tablet by mouth daily.   rosuvastatin  (CRESTOR ) 5 MG tablet Take 1 tablet by mouth once daily    No facility-administered encounter medications on file as of 09/24/2023.

## 2023-09-29 ENCOUNTER — Ambulatory Visit
Admission: RE | Admit: 2023-09-29 | Discharge: 2023-09-29 | Disposition: A | Discharge status: Home / Self Care | Source: Ambulatory Visit | Attending: Family Medicine | Admitting: Family Medicine

## 2023-09-29 DIAGNOSIS — E2839 Other primary ovarian failure: Secondary | ICD-10-CM | POA: Diagnosis not present

## 2023-09-29 DIAGNOSIS — Z780 Asymptomatic menopausal state: Secondary | ICD-10-CM | POA: Diagnosis not present

## 2023-09-29 DIAGNOSIS — M810 Age-related osteoporosis without current pathological fracture: Secondary | ICD-10-CM | POA: Diagnosis not present

## 2023-09-30 ENCOUNTER — Ambulatory Visit: Payer: Self-pay | Admitting: Family Medicine

## 2023-11-13 ENCOUNTER — Ambulatory Visit: Admitting: Neurosurgery

## 2023-11-13 ENCOUNTER — Encounter: Payer: Self-pay | Admitting: Neurosurgery

## 2023-11-13 ENCOUNTER — Other Ambulatory Visit: Payer: Self-pay

## 2023-11-13 VITALS — BP 144/92 | Ht 61.0 in | Wt 174.0 lb

## 2023-11-13 DIAGNOSIS — Z01818 Encounter for other preprocedural examination: Secondary | ICD-10-CM

## 2023-11-13 DIAGNOSIS — M5431 Sciatica, right side: Secondary | ICD-10-CM | POA: Diagnosis not present

## 2023-11-13 DIAGNOSIS — M810 Age-related osteoporosis without current pathological fracture: Secondary | ICD-10-CM | POA: Diagnosis not present

## 2023-11-13 DIAGNOSIS — M5432 Sciatica, left side: Secondary | ICD-10-CM | POA: Diagnosis not present

## 2023-11-13 DIAGNOSIS — M48062 Spinal stenosis, lumbar region with neurogenic claudication: Secondary | ICD-10-CM | POA: Diagnosis not present

## 2023-11-13 MED ORDER — METHOCARBAMOL 500 MG PO TABS: 500.0000 mg | ORAL_TABLET | Freq: Four times a day (QID) | ORAL | 0 refills | Status: DC | PRN

## 2023-11-13 MED ORDER — MELOXICAM 15 MG PO TABS: 15.0000 mg | ORAL_TABLET | Freq: Every day | ORAL | 0 refills | Status: DC

## 2023-11-13 NOTE — Patient Instructions (Signed)
 Please see below for information in regards to your upcoming surgery:   Planned surgery: L2-S1 posterior spinal decompression   Surgery date: 12/01/23 at [facility] (Medical Mall: [address], [place], [place] [address]) - you will find out your arrival time the business day before your surgery.   Pre-op appointment at [facility] Pre-admit Testing: you will receive a call with a date/time for this appointment. If you are scheduled for an in person appointment, Pre-admit Testing is located on the first floor of the [facility] building, [address] [facility], [address]. During this appointment, they will advise you which medications you can take the morning of surgery, and which medications you will need to hold for surgery. Labs (such as blood work, EKG) may be done at your pre-op appointment. You are not required to fast for these labs. Should you need to change your pre-op appointment, please call Pre-admit testing at [phone].    Surgical clearance: we will send a clearance form to Dr Randeen. They may wish to see you in their office prior to signing the clearance form. If so, they may call you to schedule an appointment.    Common restrictions after spine surgery: No bending, lifting, or twisting ("BLT"). Avoid lifting objects heavier than 10 pounds for the first 6 weeks after surgery. Where possible, avoid household activities that involve lifting, bending, reaching, pushing, or pulling such as laundry, vacuuming, grocery shopping, and childcare. Try to arrange for help from friends and family for these activities while you heal. Do not drive while taking prescription pain medication. Weeks 6 through 12 after surgery: avoid lifting more than 25 pounds.     How to contact us :  If you have any questions/concerns before or after surgery, you can reach us  at [phone], or you can send a mychart message. We can be reached by phone or mychart 8am-4pm,  Monday-Friday.  *Please note: Calls after 4pm are forwarded to a third party answering service. Mychart messages are not routinely monitored during evenings, weekends, and holidays. Please call our office to contact the answering service for urgent concerns during non-business hours.   If you have FMLA/disability paperwork, please drop it off or fax it to [phone]   Appointments/FMLA & disability paperwork: [employer] Registered Nurse/Surgery scheduler: Syriana Croslin, RN Certified Medical Assistants: Don, CMA, Elenor, CMA, & Damien, CMA Physician Assistants: Lyle Decamp, PA-C, Edsel Goods, PA-C & Glade Boys, PA-C Surgeons: Penne Sharps, MD & Reeves Daisy, MD   [facility] PREADMIT TESTING VISIT and SURGERY INFORMATION SHEET   Now that surgery has been scheduled you can anticipate several phone calls from [facility] services. A pharmacy technician will call you to verify your current list of medications taken at home.               The Pre-Service Center will call to verify your insurance information and to give you billing estimates and information.             The Preadmit Testing Office will be calling to schedule a visit to obtain information for the anesthesia team and provide instructions on preparation for surgery.  What can you expect for the Preadmit Testing Visit: Appointments may be scheduled in-person or by telephone.  If a telephone visit is scheduled, you may be asked to come into the office to have lab tests or other studies performed.   This visit will not be completed any greater than 14 days prior to  your surgery.  If your surgery has been scheduled for a future date, please do not be alarmed if we have not contacted you to schedule an appointment more than a month prior to the surgery date.    Please be prepared to provide the following information during this appointment:            -Personal medical history                                                -Medication and allergy list            -Any history of problems with anesthesia              -Recent lab work or diagnostic studies            -Please notify us  of any needs we should be aware of to provide the best care possible           -You will be provided with instructions on how to prepare for your surgery.    On The Day of Surgery:  You must have a driver to take you home after surgery, you will be asked not to drive for 24 hours following surgery.  Taxi, Gisele and non-medical transport will not be acceptable means of transportation unless you have a responsible individual who will be traveling with you.  Visitors in the surgical area:   2 people will be able to visit you in your room once your preparation for surgery has been completed. During surgery, your visitors will be asked to wait in the Surgery Waiting Area.  It is not a requirement for them to stay, if they prefer to leave and come back.  Your visitor(s) will be given an update once the surgery has been completed.  No visitors are allowed in the initial recovery room to respect patient privacy and safety.  Once you are more awake and transfer to the secondary recovery area, or are transferred to an inpatient room, visitors will again be able to see you.  To respect and protect your privacy: We will ask on the day of surgery who your driver will be and what the contact number for that individual will be. We will ask if it is okay to share information with this individual, or if there is an alternative individual that we, or the surgeon, should contact to provide updates and information. If family or friends come to the surgical information desk requesting information about you, who you have not listed with us , no information will be given.   It may be helpful to designate someone as the main contact who will be responsible for updating your other friends and family.    PREADMIT TESTING OFFICE:  [phone] SAME DAY SURGERY: [phone] We look forward to caring for you before and throughout the process of your surgery.

## 2023-11-13 NOTE — Addendum Note (Signed)
 Addended by: Meleane Selinger on: 11/13/2023 12:03 PM   Modules accepted: Orders

## 2023-11-13 NOTE — H&P (View-Only) (Signed)
 Referring Physician:  Tower, Laine LABOR, MD [address] [place],  [place] [address]  Primary Physician:  Tower, Laine LABOR, MD  History of Present Illness: 11/13/2023 Sierra Sparks returns to review her imaging.  She continues to have symptoms of claudication that are causing significant issues for her.   09/23/2023 Sierra Sparks is here today with a chief complaint of worsening lower back and leg pain.  She experiences pain across her lower back that radiates to the front and back of her legs, not extending below the knees. The pain is equally severe in both her back and legs at times and has worsened recently. Physical therapy and injections have not provided relief, and therapy worsened her symptoms. Hip injections were also ineffective.  She can walk short distances within her house and manages grocery shopping with difficulty, using a cane for support. Walking longer distances, such as 300 yards, is problematic. Prolonged walking or standing exacerbates her back and leg pain, which improves with rest but recurs upon standing. There is no numbness or tingling in her legs, although prolonged standing can cause a sensation of numbness or 'nervy' feelings.   This has been ongoing for 2 years and is as bad as 7 out of 10.  Sitting and standing make it worse. Bowel/Bladder Dysfunction: none  Conservative measures:  Physical therapy: has participated in PT at [facility] from 04/23/23 to 04/30/23, she was discharged. Made the pain worse Multimodal medical therapy including regular antiinflammatories: Gabapentin, Meloxicam  Injections:  06/04/2023 L3-L4 ESI (no relief)  Past Surgery: no spinal surgeries  Sierra Sparks has no symptoms of cervical myelopathy.  The symptoms are causing a significant impact on the patient's life.   I have utilized the care everywhere function in epic to review the outside records available from external health systems.  Review of Systems:  A 10  point review of systems is negative, except for the pertinent positives and negatives detailed in the HPI.  Past Medical History: Past Medical History:  Diagnosis Date   Allergic rhinitis, cause unspecified    Allergy    Anemia    Anxiety state, unspecified    Arthritis    Cataract    forming    Colon polyps    Depressive disorder, not elsewhere classified    Diffuse cystic mastopathy    Disorder of bone and cartilage, unspecified    Diverticulosis    External hemorrhoids    Gallstones    GERD (gastroesophageal reflux disease)    Glaucoma    narrow angle thatwas corrected with laser surgery    Osteoarthrosis, unspecified whether generalized or localized, unspecified site    hands   Osteoporosis    Pure hypercholesterolemia    Thyroid  disease    years past- no meds currently    Unspecified essential hypertension    Unspecified hemorrhoids without mention of complication     Past Surgical History: Past Surgical History:  Procedure Laterality Date   BREAST BIOPSY Left 08/11/2020   11:00 3cmfn Q clip u/s bx papilloma   BREAST EXCISIONAL BIOPSY Left 08/25/2020   surgical exc papilloma with tag placement tag no.[id number]   BREAST LUMPECTOMY WITH RADIO FREQUENCY LOCALIZER Left 08/25/2020   Procedure: BREAST LUMPECTOMY WITH RADIO FREQUENCY LOCALIZER;  Surgeon: Tye Millet, DO;  Location: [facility] ORS;  Service: General;  Laterality: Left;   BUNIONECTOMY Left 10/1999   CESAREAN SECTION  1981   CHOLECYSTECTOMY     COLONOSCOPY  01/2002   polyp; hemorrhoids;diverticulosis  COLONOSCOPY  2013   diverticulosis   DEXA  06/2000   osteopenia   DEXA  04/2003   Stable   HAND SURGERY Left    DIP fusion-left   HEMORRHOID SURGERY     POLYPECTOMY     TONSILLECTOMY     childhood    Allergies: Allergies as of 11/13/2023   (No Known Allergies)    Medications:  Current Outpatient Medications:    amLODipine  (NORVASC ) 5 MG tablet, Take 1 tablet by mouth once daily, Disp: 90 tablet,  Rfl: 1   Ascorbic Acid (VITAMIN C) 500 MG CAPS, Take 500 mg by mouth daily., Disp: , Rfl:    Calcium  Carbonate-Vit D-Min (CALCIUM  1200) 1200-1000 MG-UNIT CHEW, Chew 600 mg by mouth 2 (two) times daily., Disp: , Rfl:    diazepam  (VALIUM ) 2 MG tablet, Take 1 tablet (2 mg total) by mouth every 8 (eight) hours as needed (vertigo)., Disp: 30 tablet, Rfl: 0   ezetimibe  (ZETIA ) 10 MG tablet, Take 1 tablet by mouth once daily, Disp: 90 tablet, Rfl: 1   FLUoxetine  (PROZAC ) 20 MG capsule, Take 1 capsule by mouth once daily, Disp: 90 capsule, Rfl: 1   fluticasone  (FLONASE ) 50 MCG/ACT nasal spray, Place 2 sprays into both nostrils daily., Disp: 16 g, Rfl: 6   hydrocortisone  (ANUSOL -HC) 25 MG suppository, Place 1 suppository (25 mg total) rectally at bedtime., Disp: 12 suppository, Rfl: 1   iron  polysaccharides (FERREX 150) 150 MG capsule, Take 1 capsule by mouth twice daily, Disp: 180 capsule, Rfl: 2   meclizine  (ANTIVERT ) 25 MG tablet, Take 1 tablet (25 mg total) by mouth 3 (three) times daily as needed for dizziness., Disp: 30 tablet, Rfl: 0   Melatonin 10 MG TABS, Take 10 mg by mouth daily., Disp: , Rfl:    meloxicam  (MOBIC ) 7.5 MG tablet, Take 1 tablet (7.5 mg total) by mouth daily., Disp: 90 tablet, Rfl: 3   Multiple Vitamin (MULTIVITAMIN) tablet, Take 1 tablet by mouth daily., Disp: , Rfl:    rosuvastatin  (CRESTOR ) 5 MG tablet, Take 1 tablet by mouth once daily, Disp: 90 tablet, Rfl: 1  Social History: Social History   Tobacco Use   Smoking status: Never   Smokeless tobacco: Never  Vaping Use   Vaping status: Never Used  Substance Use Topics   Alcohol use: Yes    Comment: Occasional-wine, 3 per week   Drug use: No    Family Medical History: Family History  Problem Relation Age of Onset   Stroke Mother    Coronary artery disease Mother 61   Hypertension Mother    Hyperlipidemia Mother    Arthritis Father    Lung cancer Father        + smoker   Hypertension Sister    Migraines Sister     Colon cancer Brother        ? primary colon   Liver cancer Brother    Cancer Brother        type unknown   Stroke Maternal Grandmother        hemorrhagic   Cancer Maternal Grandmother        female cancer type unknown   Bone cancer Paternal Uncle    Esophageal cancer Neg Hx    Rectal cancer Neg Hx    Stomach cancer Neg Hx    Breast cancer Neg Hx    Colon polyps Neg Hx     Physical Examination: There were no vitals filed for this visit.   General: Patient  is in no apparent distress. Attention to examination is appropriate.  Neck:   Supple.  Full range of motion.  Respiratory: Patient is breathing without any difficulty.   NEUROLOGICAL:     Awake, alert, oriented to person, place, and time.  Speech is clear and fluent.   Cranial Nerves: Pupils equal round and reactive to light.  Facial tone is symmetric.  Facial sensation is symmetric. Shoulder shrug is symmetric. Tongue protrusion is midline.  There is no pronator drift.  Strength: Side Biceps Triceps Deltoid Interossei Grip Wrist Ext. Wrist Flex.  R 5 5 5 5 5 5 5   L 5 5 5 5 5 5 5    Side Iliopsoas Quads Hamstring PF DF EHL  R 5 5 5 5 5 5   L 5 5 5 5 5 5    Reflexes are 1+ and symmetric at the biceps, triceps, brachioradialis, patella and achilles.   Hoffman's is absent.   Bilateral upper and lower extremity sensation is intact to light touch.    No evidence of dysmetria noted.  Gait is abnormal-she is stooped forward and has to use a cane.  FABER testing is negative bilaterally   Medical Decision Making  Imaging: MRI L spine 05/20/2023 L2-3 moderate to severe spinal canal stenosis.  Effacement of the lateral recesses at this level likely compresses the descending L3 nerve roots.  L3-4 mild to moderate spinal canal stenosis with mild to moderate left and mild right neuroforaminal narrowing.  L4-5 and L5-S1 mild to moderate spinal canal stenosis and mild bilateral neuroforaminal narrowing.  Narrowing of the  lateral recesses at L1-2, L3-4, L4-5, and L5-S1 could affect the descending L2, L4, L5, and S1 nerve roots respectively.  Nerve root thickening suspected in the inferior cauda equina which can be seen in the setting of arachnoiditis.  I have personally reviewed the images and agree with the above interpretation.  Assessment and Plan: Sierra Sparks is a pleasant 82 y.o. female with chronic bilateral low back pain with bilateral sciatica.  She has neurogenic claudication.  She has maximized conservative management.  She was discharged from physical therapy and has tried medications and injections.  Based on her current findings, I have recommended L2-S1 decompression to address her moderate to severe central and lateral recess stenosis at those levels.  She has osteoporosis.  She may ultimately need treatment for this, but that will be determined by her primary care provider.  I will increase her meloxicam  and start her on a muscle relaxant.  I recommended that she undergo surgical decompression to address her claudication symptoms.  I discussed the planned procedure at length with the patient, including the risks, benefits, alternatives, and indications. The risks discussed include but are not limited to bleeding, infection, need for reoperation, spinal fluid leak, stroke, vision loss, anesthetic complication, coma, paralysis, and even death. I also described in detail that improvement was not guaranteed.  The patient expressed understanding of these risks, and asked that we proceed with surgery. I described the surgery in layman's terms, and gave ample opportunity for questions, which were answered to the best of my ability.    Thank you for involving me in the care of this patient.      Sharod Petsch K. Clois MD, [facility] Neurosurgery

## 2023-11-13 NOTE — Progress Notes (Signed)
 Referring Physician:  Tower, Laine LABOR, MD [address] [place],  [place] [address]  Primary Physician:  Tower, Laine LABOR, MD  History of Present Illness: 11/13/2023 Sierra Sparks returns to review her imaging.  She continues to have symptoms of claudication that are causing significant issues for her.   09/23/2023 Sierra Sparks is here today with a chief complaint of worsening lower back and leg pain.  She experiences pain across her lower back that radiates to the front and back of her legs, not extending below the knees. The pain is equally severe in both her back and legs at times and has worsened recently. Physical therapy and injections have not provided relief, and therapy worsened her symptoms. Hip injections were also ineffective.  She can walk short distances within her house and manages grocery shopping with difficulty, using a cane for support. Walking longer distances, such as 300 yards, is problematic. Prolonged walking or standing exacerbates her back and leg pain, which improves with rest but recurs upon standing. There is no numbness or tingling in her legs, although prolonged standing can cause a sensation of numbness or 'nervy' feelings.   This has been ongoing for 2 years and is as bad as 7 out of 10.  Sitting and standing make it worse. Bowel/Bladder Dysfunction: none  Conservative measures:  Physical therapy: has participated in PT at [facility] from 04/23/23 to 04/30/23, she was discharged. Made the pain worse Multimodal medical therapy including regular antiinflammatories: Gabapentin, Meloxicam  Injections:  06/04/2023 L3-L4 ESI (no relief)  Past Surgery: no spinal surgeries  [name] has no symptoms of cervical myelopathy.  The symptoms are causing a significant impact on the patient's life.   I have utilized the care everywhere function in epic to review the outside records available from external health systems.  Review of Systems:  A 10  point review of systems is negative, except for the pertinent positives and negatives detailed in the HPI.  Past Medical History: Past Medical History:  Diagnosis Date   Allergic rhinitis, cause unspecified    Allergy    Anemia    Anxiety state, unspecified    Arthritis    Cataract    forming    Colon polyps    Depressive disorder, not elsewhere classified    Diffuse cystic mastopathy    Disorder of bone and cartilage, unspecified    Diverticulosis    External hemorrhoids    Gallstones    GERD (gastroesophageal reflux disease)    Glaucoma    narrow angle thatwas corrected with laser surgery    Osteoarthrosis, unspecified whether generalized or localized, unspecified site    hands   Osteoporosis    Pure hypercholesterolemia    Thyroid  disease    years past- no meds currently    Unspecified essential hypertension    Unspecified hemorrhoids without mention of complication     Past Surgical History: Past Surgical History:  Procedure Laterality Date   BREAST BIOPSY Left 08/11/2020   11:00 3cmfn Q clip u/s bx papilloma   BREAST EXCISIONAL BIOPSY Left 08/25/2020   surgical exc papilloma with tag placement tag no.[id number]   BREAST LUMPECTOMY WITH RADIO FREQUENCY LOCALIZER Left 08/25/2020   Procedure: BREAST LUMPECTOMY WITH RADIO FREQUENCY LOCALIZER;  Surgeon: Tye Millet, DO;  Location: [facility] ORS;  Service: General;  Laterality: Left;   BUNIONECTOMY Left 10/1999   CESAREAN SECTION  1981   CHOLECYSTECTOMY     COLONOSCOPY  01/2002   polyp; hemorrhoids;diverticulosis  COLONOSCOPY  2013   diverticulosis   DEXA  06/2000   osteopenia   DEXA  04/2003   Stable   HAND SURGERY Left    DIP fusion-left   HEMORRHOID SURGERY     POLYPECTOMY     TONSILLECTOMY     childhood    Allergies: Allergies as of 11/13/2023   (No Known Allergies)    Medications:  Current Outpatient Medications:    amLODipine  (NORVASC ) 5 MG tablet, Take 1 tablet by mouth once daily, Disp: 90 tablet,  Rfl: 1   Ascorbic Acid (VITAMIN C) 500 MG CAPS, Take 500 mg by mouth daily., Disp: , Rfl:    Calcium  Carbonate-Vit D-Min (CALCIUM  1200) 1200-1000 MG-UNIT CHEW, Chew 600 mg by mouth 2 (two) times daily., Disp: , Rfl:    diazepam  (VALIUM ) 2 MG tablet, Take 1 tablet (2 mg total) by mouth every 8 (eight) hours as needed (vertigo)., Disp: 30 tablet, Rfl: 0   ezetimibe  (ZETIA ) 10 MG tablet, Take 1 tablet by mouth once daily, Disp: 90 tablet, Rfl: 1   FLUoxetine  (PROZAC ) 20 MG capsule, Take 1 capsule by mouth once daily, Disp: 90 capsule, Rfl: 1   fluticasone  (FLONASE ) 50 MCG/ACT nasal spray, Place 2 sprays into both nostrils daily., Disp: 16 g, Rfl: 6   hydrocortisone  (ANUSOL -HC) 25 MG suppository, Place 1 suppository (25 mg total) rectally at bedtime., Disp: 12 suppository, Rfl: 1   iron  polysaccharides (FERREX 150) 150 MG capsule, Take 1 capsule by mouth twice daily, Disp: 180 capsule, Rfl: 2   meclizine  (ANTIVERT ) 25 MG tablet, Take 1 tablet (25 mg total) by mouth 3 (three) times daily as needed for dizziness., Disp: 30 tablet, Rfl: 0   Melatonin 10 MG TABS, Take 10 mg by mouth daily., Disp: , Rfl:    meloxicam  (MOBIC ) 7.5 MG tablet, Take 1 tablet (7.5 mg total) by mouth daily., Disp: 90 tablet, Rfl: 3   Multiple Vitamin (MULTIVITAMIN) tablet, Take 1 tablet by mouth daily., Disp: , Rfl:    rosuvastatin  (CRESTOR ) 5 MG tablet, Take 1 tablet by mouth once daily, Disp: 90 tablet, Rfl: 1  Social History: Social History   Tobacco Use   Smoking status: Never   Smokeless tobacco: Never  Vaping Use   Vaping status: Never Used  Substance Use Topics   Alcohol use: Yes    Comment: Occasional-wine, 3 per week   Drug use: No    Family Medical History: Family History  Problem Relation Age of Onset   Stroke Mother    Coronary artery disease Mother 61   Hypertension Mother    Hyperlipidemia Mother    Arthritis Father    Lung cancer Father        + smoker   Hypertension Sister    Migraines Sister     Colon cancer Brother        ? primary colon   Liver cancer Brother    Cancer Brother        type unknown   Stroke Maternal Grandmother        hemorrhagic   Cancer Maternal Grandmother        female cancer type unknown   Bone cancer Paternal Uncle    Esophageal cancer Neg Hx    Rectal cancer Neg Hx    Stomach cancer Neg Hx    Breast cancer Neg Hx    Colon polyps Neg Hx     Physical Examination: There were no vitals filed for this visit.   General: Patient  is in no apparent distress. Attention to examination is appropriate.  Neck:   Supple.  Full range of motion.  Respiratory: Patient is breathing without any difficulty.   NEUROLOGICAL:     Awake, alert, oriented to person, place, and time.  Speech is clear and fluent.   Cranial Nerves: Pupils equal round and reactive to light.  Facial tone is symmetric.  Facial sensation is symmetric. Shoulder shrug is symmetric. Tongue protrusion is midline.  There is no pronator drift.  Strength: Side Biceps Triceps Deltoid Interossei Grip Wrist Ext. Wrist Flex.  R 5 5 5 5 5 5 5   L 5 5 5 5 5 5 5    Side Iliopsoas Quads Hamstring PF DF EHL  R 5 5 5 5 5 5   L 5 5 5 5 5 5    Reflexes are 1+ and symmetric at the biceps, triceps, brachioradialis, patella and achilles.   Hoffman's is absent.   Bilateral upper and lower extremity sensation is intact to light touch.    No evidence of dysmetria noted.  Gait is abnormal-she is stooped forward and has to use a cane.  FABER testing is negative bilaterally   Medical Decision Making  Imaging: MRI L spine 05/20/2023 L2-3 moderate to severe spinal canal stenosis.  Effacement of the lateral recesses at this level likely compresses the descending L3 nerve roots.  L3-4 mild to moderate spinal canal stenosis with mild to moderate left and mild right neuroforaminal narrowing.  L4-5 and L5-S1 mild to moderate spinal canal stenosis and mild bilateral neuroforaminal narrowing.  Narrowing of the  lateral recesses at L1-2, L3-4, L4-5, and L5-S1 could affect the descending L2, L4, L5, and S1 nerve roots respectively.  Nerve root thickening suspected in the inferior cauda equina which can be seen in the setting of arachnoiditis.  I have personally reviewed the images and agree with the above interpretation.  Assessment and Plan: Sierra Sparks is a pleasant 82 y.o. female with chronic bilateral low back pain with bilateral sciatica.  She has neurogenic claudication.  She has maximized conservative management.  She was discharged from physical therapy and has tried medications and injections.  Based on her current findings, I have recommended L2-S1 decompression to address her moderate to severe central and lateral recess stenosis at those levels.  She has osteoporosis.  She may ultimately need treatment for this, but that will be determined by her primary care provider.  I will increase her meloxicam  and start her on a muscle relaxant.  I recommended that she undergo surgical decompression to address her claudication symptoms.  I discussed the planned procedure at length with the patient, including the risks, benefits, alternatives, and indications. The risks discussed include but are not limited to bleeding, infection, need for reoperation, spinal fluid leak, stroke, vision loss, anesthetic complication, coma, paralysis, and even death. I also described in detail that improvement was not guaranteed.  The patient expressed understanding of these risks, and asked that we proceed with surgery. I described the surgery in layman's terms, and gave ample opportunity for questions, which were answered to the best of my ability.    Thank you for involving me in the care of this patient.      Sharod Petsch K. Clois MD, [facility] Neurosurgery

## 2023-11-14 ENCOUNTER — Telehealth: Payer: Self-pay | Admitting: Neurosurgery

## 2023-11-14 NOTE — Telephone Encounter (Signed)
 I spoke with Mr Delpriore and explained the process that physical therapy will evaluate her while in the hospital and if they recommend that she go to a facility, they can let them know that [facility] is their preferred choice.

## 2023-11-14 NOTE — Telephone Encounter (Signed)
 Patients husband called stating that after thinking about the procedure yesterday, they have decided they think its best that the patient is to stay at an outpatient facility after surgery so she can get the best care.  They would prefer [facility].  Please contact husband with any further questions.

## 2023-11-18 ENCOUNTER — Telehealth: Payer: Self-pay | Admitting: *Deleted

## 2023-11-18 NOTE — Telephone Encounter (Signed)
 PCP received surgical clearance forms from [facility], per Dr. Randeen pt needs an appt., schedule asap

## 2023-11-21 ENCOUNTER — Encounter
Admission: RE | Admit: 2023-11-21 | Discharge: 2023-11-21 | Disposition: A | Discharge status: Home / Self Care | Source: Ambulatory Visit | Attending: Neurosurgery | Admitting: Neurosurgery

## 2023-11-21 ENCOUNTER — Other Ambulatory Visit: Payer: Self-pay

## 2023-11-21 VITALS — BP 176/89 | HR 84 | Resp 16 | Ht 61.0 in | Wt 173.0 lb

## 2023-11-21 DIAGNOSIS — I491 Atrial premature depolarization: Secondary | ICD-10-CM | POA: Insufficient documentation

## 2023-11-21 DIAGNOSIS — Z01818 Encounter for other preprocedural examination: Secondary | ICD-10-CM | POA: Insufficient documentation

## 2023-11-21 DIAGNOSIS — Z01810 Encounter for preprocedural cardiovascular examination: Secondary | ICD-10-CM | POA: Diagnosis not present

## 2023-11-21 DIAGNOSIS — R7303 Prediabetes: Secondary | ICD-10-CM | POA: Diagnosis not present

## 2023-11-21 DIAGNOSIS — E7800 Pure hypercholesterolemia, unspecified: Secondary | ICD-10-CM | POA: Diagnosis not present

## 2023-11-21 DIAGNOSIS — I10 Essential (primary) hypertension: Secondary | ICD-10-CM | POA: Insufficient documentation

## 2023-11-21 HISTORY — DX: Prediabetes: R73.03

## 2023-11-21 LAB — CBC
HCT: 40 % (ref 36.0–46.0)
Hemoglobin: 13.1 g/dL (ref 12.0–15.0)
MCH: 31.1 pg (ref 26.0–34.0)
MCHC: 32.8 g/dL (ref 30.0–36.0)
MCV: 95 fL (ref 80.0–100.0)
Platelets: 239 K/uL (ref 150–400)
RBC: 4.21 MIL/uL (ref 3.87–5.11)
RDW: 13 % (ref 11.5–15.5)
WBC: 4.4 K/uL (ref 4.0–10.5)
nRBC: 0 % (ref 0.0–0.2)

## 2023-11-21 LAB — BASIC METABOLIC PANEL WITH GFR
Anion gap: 13 (ref 5–15)
BUN: 21 mg/dL (ref 8–23)
CO2: 23 mmol/L (ref 22–32)
Calcium: 9.4 mg/dL (ref 8.9–10.3)
Chloride: 105 mmol/L (ref 98–111)
Creatinine, Ser: 0.59 mg/dL (ref 0.44–1.00)
GFR, Estimated: >60 mL/min (ref >60)
Glucose, Bld: 107 mg/dL — ABNORMAL HIGH (ref 70–99)
Potassium: 3.8 mmol/L (ref 3.5–5.1)
Sodium: 141 mmol/L (ref 135–145)

## 2023-11-21 LAB — TYPE AND SCREEN
ABO/RH(D): O POS
Antibody Screen: NEGATIVE

## 2023-11-21 LAB — SURGICAL PCR SCREEN
MRSA, PCR: NEGATIVE
Staphylococcus aureus: NEGATIVE

## 2023-11-21 NOTE — Patient Instructions (Signed)
 Your procedure is scheduled on: Friday 11/28/23 Report to the Registration Desk on the 1st floor of the [facility]. To find out your arrival time, please call [phone] between 1PM - 3PM on: Monday 12/01/23 If your arrival time is 6:00 am, do not arrive before that time as the [facility] entrance doors do not open until 6:00 am.  REMEMBER: Instructions that are not followed completely may result in serious medical risk, up to and including death; or upon the discretion of your surgeon and anesthesiologist your surgery may need to be rescheduled.  Do not eat food after midnight the night before surgery.  No gum chewing or hard candies.  You may however, drink CLEAR liquids up to 2 hours before you are scheduled to arrive for your surgery. Do not drink anything within 2 hours of your scheduled arrival time.  Clear liquids include: - water  - apple juice without pulp - gatorade (not RED colors) - black coffee or tea (Do NOT add milk or creamers to the coffee or tea) Do NOT drink anything that is not on this list.  **Type 1 and Type 2 diabetics should only drink water.**  In addition, your doctor has ordered for you to drink the provided:  Ensure Pre-Surgery Clear Carbohydrate Drink  Gatorade G2 Drinking this carbohydrate drink up to two hours before surgery helps to reduce insulin resistance and improve patient outcomes. Please complete drinking 2 hours before scheduled arrival time.  One week prior to surgery: Stop Anti-inflammatories (NSAIDS) such as Advil, Aleve, Ibuprofen, Motrin, Naproxen, Naprosyn and Aspirin based products such as Excedrin, Goody's Powder, BC Powder. Stop ANY OVER THE COUNTER supplements until after surgery.  You may however, continue to take Tylenol  if needed for pain up until the day of surgery.  **Follow guidelines for insulin and diabetes medications.**  **Follow recommendations regarding stopping blood thinners.**  Continue taking all of your  other prescription medications up until the day of surgery.  ON THE DAY OF SURGERY ONLY TAKE THESE MEDICATIONS WITH SIPS OF WATER:    Use inhalers on the day of surgery and bring to the hospital.  Fleets enema or bowel prep as directed.  No Alcohol for 24 hours before or after surgery.  No Smoking including e-cigarettes for 24 hours before surgery.  No chewable tobacco products for at least 6 hours before surgery.  No nicotine patches on the day of surgery.  Do not use any recreational drugs for at least a week (preferably 2 weeks) before your surgery.  Please be advised that the combination of cocaine and anesthesia may have negative outcomes, up to and including death. If you test positive for cocaine, your surgery will be cancelled.  On the morning of surgery brush your teeth with toothpaste and water, you may rinse your mouth with mouthwash if you wish. Do not swallow any toothpaste or mouthwash.  Use CHG Soap or wipes as directed on instruction sheet.  Do not wear jewelry, make-up, hairpins, clips or nail polish.  For welded (permanent) jewelry: bracelets, anklets, waist bands, etc.  Please have this removed prior to surgery.  If it is not removed, there is a chance that hospital personnel will need to cut it off on the day of surgery.  Do not wear lotions, powders, or perfumes.   Do not shave body hair from the neck down 48 hours before surgery.  Contact lenses, hearing aids and dentures may not be worn into surgery.  Do not bring valuables to  the hospital. [facility] is not responsible for any missing/lost belongings or valuables.   Total Shoulder Arthroplasty:  use Benzoyl Peroxide 5% Gel as directed on instruction sheet.  Bring your C-PAP to the hospital in case you may have to spend the night.   Notify your doctor if there is any change in your medical condition (cold, fever, infection).  Wear comfortable clothing (specific to your surgery type) to the  hospital.  After surgery, you can help prevent lung complications by doing breathing exercises.  Take deep breaths and cough every 1-2 hours. Your doctor may order a device called an Incentive Spirometer to help you take deep breaths. When coughing or sneezing, hold a pillow firmly against your incision with both hands. This is called "splinting." Doing this helps protect your incision. It also decreases belly discomfort.  If you are being admitted to the hospital overnight, leave your suitcase in the car. After surgery it may be brought to your room.  In case of increased patient census, it may be necessary for you, the patient, to continue your postoperative care in the Same Day Surgery department.  If you are being discharged the day of surgery, you will not be allowed to drive home. You will need a responsible individual to drive you home and stay with you for 24 hours after surgery.   If you are taking public transportation, you will need to have a responsible individual with you.  Please call the Pre-admissions Testing Dept. at [phone] if you have any questions about these instructions.  Surgery Visitation Policy:  Patients having surgery or a procedure may have two visitors.  Children under the age of 48 must have an adult with them who is not the patient.  Inpatient Visitation:    Visiting hours are 7 a.m. to 8 p.m. Up to four visitors are allowed at one time in a patient room. The visitors may rotate out with other people during the day.  One visitor age 43 or older may stay with the patient overnight and must be in the room by 8 p.m.   Merchandiser, retail to address health-related social needs:  [email or web address]    Pre-operative 5 CHG Bath Instructions   You can play a key role in reducing the risk of infection after surgery. Your skin needs to be as free of germs as possible. You can reduce the number of germs on your skin by washing with  CHG (chlorhexidine  gluconate) soap before surgery. CHG is an antiseptic soap that kills germs and continues to kill germs even after washing.   DO NOT use if you have an allergy to chlorhexidine /CHG or antibacterial soaps. If your skin becomes reddened or irritated, stop using the CHG and notify one of our RNs at [phone].   Please shower with the CHG soap starting 4 days before surgery using the following schedule:   Thursday 11/27/23 - Monday 12/01/23    Please keep in mind the following:  DO NOT shave, including legs and underarms, starting the day of your first shower.   You may shave your face at any point before/day of surgery.  Place clean sheets on your bed the day you start using CHG soap. Use a clean washcloth (not used since being washed) for each shower. DO NOT sleep with pets once you start using the CHG.   CHG Shower Instructions:  If you choose to wash your hair and private area, wash first with your normal shampoo/soap.  After you use  shampoo/soap, rinse your hair and body thoroughly to remove shampoo/soap residue.  Turn the water OFF and apply about 3 tablespoons (45 ml) of CHG soap to a CLEAN washcloth.  Apply CHG soap ONLY FROM YOUR NECK DOWN TO YOUR TOES (washing for 3-5 minutes)  DO NOT use CHG soap on face, private areas, open wounds, or sores.  Pay special attention to the area where your surgery is being performed.  If you are having back surgery, having someone wash your back for you may be helpful. Wait 2 minutes after CHG soap is applied, then you may rinse off the CHG soap.  Pat dry with a clean towel  Put on clean clothes/pajamas   If you choose to wear lotion, please use ONLY the CHG-compatible lotions on the back of this paper.     Additional instructions for the day of surgery: DO NOT APPLY any lotions, deodorants, cologne, or perfumes.   Put on clean/comfortable clothes.  Brush your teeth.  Ask your nurse before applying any prescription  medications to the skin.      CHG Compatible Lotions   Aveeno Moisturizing lotion  Cetaphil Moisturizing Cream  Cetaphil Moisturizing Lotion  Clairol Herbal Essence Moisturizing Lotion, Dry Skin  Clairol Herbal Essence Moisturizing Lotion, Extra Dry Skin  Clairol Herbal Essence Moisturizing Lotion, Normal Skin  Curel Age Defying Therapeutic Moisturizing Lotion with Alpha Hydroxy  Curel Extreme Care Body Lotion  Curel Soothing Hands Moisturizing Hand Lotion  Curel Therapeutic Moisturizing Cream, Fragrance-Free  Curel Therapeutic Moisturizing Lotion, Fragrance-Free  Curel Therapeutic Moisturizing Lotion, Original Formula  Eucerin Daily Replenishing Lotion  Eucerin Dry Skin Therapy Plus Alpha Hydroxy Crme  Eucerin Dry Skin Therapy Plus Alpha Hydroxy Lotion  Eucerin Original Crme  Eucerin Original Lotion  Eucerin Plus Crme Eucerin Plus Lotion  Eucerin TriLipid Replenishing Lotion  Keri Anti-Bacterial Hand Lotion  Keri Deep Conditioning Original Lotion Dry Skin Formula Softly Scented  Keri Deep Conditioning Original Lotion, Fragrance Free Sensitive Skin Formula  Keri Lotion Fast Absorbing Fragrance Free Sensitive Skin Formula  Keri Lotion Fast Absorbing Softly Scented Dry Skin Formula  Keri Original Lotion  Keri Skin Renewal Lotion Keri Silky Smooth Lotion  Keri Silky Smooth Sensitive Skin Lotion  Nivea Body Creamy Conditioning Oil  Nivea Body Extra Enriched Lotion  Nivea Body Original Lotion  Nivea Body Sheer Moisturizing Lotion Nivea Crme  Nivea Skin Firming Lotion  NutraDerm 30 Skin Lotion  NutraDerm Skin Lotion  NutraDerm Therapeutic Skin Cream  NutraDerm Therapeutic Skin Lotion  ProShield Protective Hand Cream  Provon moisturizing lotion  How to Use an Incentive Spirometer  An incentive spirometer is a tool that measures how well you are filling your lungs with each breath. Learning to take long, deep breaths using this tool can help you keep your lungs clear and  active. This may help to reverse or lessen your chance of developing breathing (pulmonary) problems, especially infection. You may be asked to use a spirometer: After a surgery. If you have a lung problem or a history of smoking. After a long period of time when you have been unable to move or be active. If the spirometer includes an indicator to show the highest number that you have reached, your health care provider or respiratory therapist will help you set a goal. Keep a log of your progress as told by your health care provider. What are the risks? Breathing too quickly may cause dizziness or cause you to pass out. Take your time so you do not  get dizzy or light-headed. If you are in pain, you may need to take pain medicine before doing incentive spirometry. It is harder to take a deep breath if you are having pain. How to use your incentive spirometer  Sit up on the edge of your bed or on a chair. Hold the incentive spirometer so that it is in an upright position. Before you use the spirometer, breathe out normally. Place the mouthpiece in your mouth. Make sure your lips are closed tightly around it. Breathe in slowly and as deeply as you can through your mouth, causing the piston or the ball to rise toward the top of the chamber. Hold your breath for 3-5 seconds, or for as long as possible. If the spirometer includes a coach indicator, use this to guide you in breathing. Slow down your breathing if the indicator goes above the marked areas. Remove the mouthpiece from your mouth and breathe out normally. The piston or ball will return to the bottom of the chamber. Rest for a few seconds, then repeat the steps 10 or more times. Take your time and take a few normal breaths between deep breaths so that you do not get dizzy or light-headed. Do this every 1-2 hours when you are awake. If the spirometer includes a goal marker to show the highest number you have reached (best effort), use this as  a goal to work toward during each repetition. After each set of 10 deep breaths, cough a few times. This will help to make sure that your lungs are clear. If you have an incision on your chest or abdomen from surgery, place a pillow or a rolled-up towel firmly against the incision when you cough. This can help to reduce pain while taking deep breaths and coughing. General tips When you are able to get out of bed: Walk around often. Continue to take deep breaths and cough in order to clear your lungs. Keep using the incentive spirometer until your health care provider says it is okay to stop using it. If you have been in the hospital, you may be told to keep using the spirometer at home. Contact a health care provider if: You are having difficulty using the spirometer. You have trouble using the spirometer as often as instructed. Your pain medicine is not giving enough relief for you to use the spirometer as told. You have a fever. Get help right away if: You develop shortness of breath. You develop a cough with bloody mucus from the lungs. You have fluid or blood coming from an incision site after you cough. Summary An incentive spirometer is a tool that can help you learn to take long, deep breaths to keep your lungs clear and active. You may be asked to use a spirometer after a surgery, if you have a lung problem or a history of smoking, or if you have been inactive for a long period of time. Use your incentive spirometer as instructed every 1-2 hours while you are awake. If you have an incision on your chest or abdomen, place a pillow or a rolled-up towel firmly against your incision when you cough. This will help to reduce pain. Get help right away if you have shortness of breath, you cough up bloody mucus, or blood comes from your incision when you cough. This information is not intended to replace advice given to you by your health care provider. Make sure you discuss any questions you  have with your health care provider. Document  Revised: 06/21/2019 Document Reviewed: 06/21/2019 Elsevier Patient Education  2023 [employer].

## 2023-11-21 NOTE — Patient Instructions (Addendum)
 Your procedure is scheduled on: Monday 12/01/23 Report to the Registration Desk on the 1st floor of [facility]. To find out your arrival time, please call [phone] between 1PM - 3PM on: Friday 11/28/23 If your arrival time is 6:00 am, do not arrive before that time as the [facility] entrance doors do not open until 6:00 am.  REMEMBER: Instructions that are not followed completely may result in serious medical risk, up to and including death; or upon the discretion of your surgeon and anesthesiologist your surgery may need to be rescheduled.  Do not eat food after midnight the night before surgery.  No gum chewing or hard candies.  You may however, drink CLEAR liquids up to 2 hours before you are scheduled to arrive for your surgery. Do not drink anything within 2 hours of your scheduled arrival time.  Clear liquids include: - water  - apple juice without pulp - gatorade (not RED colors) - black coffee or tea (Do NOT add milk or creamers to the coffee or tea) Do NOT drink anything that is not on this list.  One week prior to surgery: Stop Anti-inflammatories (NSAIDS) such as Advil, Aleve, Ibuprofen, Motrin, Naproxen, Naprosyn and Aspirin based products such as Excedrin, Goody's Powder, BC Powder.  You may however, continue to take Tylenol  if needed for pain up until the day of surgery.  Stop ALL OVER THE COUNTER supplements and vitamins until after surgery. (You can continue your iron )  Continue taking all of your other prescription medications up until the day of surgery.  ON THE DAY OF SURGERY ONLY TAKE THESE MEDICATIONS WITH SIPS OF WATER:  Amlodipine /Norvasc  Ezetimibe /Zetia  Fluoxetine /Prozac  Rosuvastatin /Crestor   No Alcohol for 24 hours before or after surgery.  No Smoking including e-cigarettes for 24 hours before surgery.  No chewable tobacco products for at least 6 hours before surgery.  No nicotine patches on the day of surgery.  Do not use any  recreational drugs for at least a week (preferably 2 weeks) before your surgery.  Please be advised that the combination of cocaine and anesthesia may have negative outcomes, up to and including death. If you test positive for cocaine, your surgery will be cancelled.  On the morning of surgery brush your teeth with toothpaste and water, you may rinse your mouth with mouthwash if you wish. Do not swallow any toothpaste or mouthwash.  Use CHG Soap or wipes as directed on instruction sheet.  Do not wear lotions, powders, or perfumes.   Do not shave body hair from the neck down 48 hours before surgery.  Wear comfortable clothing (specific to your surgery type) to the hospital.  Do not wear jewelry, make-up, hairpins, clips or nail polish.  For welded (permanent) jewelry: bracelets, anklets, waist bands, etc.  Please have this removed prior to surgery.  If it is not removed, there is a chance that hospital personnel will need to cut it off on the day of surgery.  Contact lenses, hearing aids and dentures may not be worn into surgery. Bring a case for your glasses  Do not bring valuables to the hospital. [facility] is not responsible for any missing/lost belongings or valuables.   Notify your doctor if there is any change in your medical condition (cold, fever, infection).  After surgery, you can help prevent lung complications by doing breathing exercises.  Take deep breaths and cough every 1-2 hours. Your doctor may order a device called an Incentive Spirometer to help you take deep breaths.  If you are being admitted to the hospital overnight, leave your suitcase in the car. After surgery it may be brought to your room.  In case of increased patient census, it may be necessary for you, the patient, to continue your postoperative care in the Same Day Surgery department.  If you are being discharged the day of surgery, you will not be allowed to drive home. You will need a responsible  individual to drive you home and stay with you for 24 hours after surgery.   Please call the Pre-admissions Testing Dept. at [phone] if you have any questions about these instructions.  Surgery Visitation Policy:  Patients having surgery or a procedure may have two visitors.  Children under the age of 78 must have an adult with them who is not the patient.  Inpatient Visitation:    Visiting hours are 7 a.m. to 8 p.m. Up to four visitors are allowed at one time in a patient room. The visitors may rotate out with other people during the day.  One visitor age 82 or older may stay with the patient overnight and must be in the room by 8 p.m.   Merchandiser, retail to address health-related social needs:  [email or web address]    Pre-operative 5 CHG Bath Instructions   You can play a key role in reducing the risk of infection after surgery. Your skin needs to be as free of germs as possible. You can reduce the number of germs on your skin by washing with CHG (chlorhexidine  gluconate) soap before surgery. CHG is an antiseptic soap that kills germs and continues to kill germs even after washing.   DO NOT use if you have an allergy to chlorhexidine /CHG or antibacterial soaps. If your skin becomes reddened or irritated, stop using the CHG and notify one of our RNs at [phone].   Please shower with the CHG soap starting 4 days before surgery using the following schedule:   Thursday 08/27/23 - Monday 08/31/23    Please keep in mind the following:  DO NOT shave, including legs and underarms, starting the day of your first shower.   You may shave your face at any point before/day of surgery.  Place clean sheets on your bed the day you start using CHG soap. Use a clean washcloth (not used since being washed) for each shower. DO NOT sleep with pets once you start using the CHG.   CHG Shower Instructions:  If you choose to wash your hair and private area, wash first  with your normal shampoo/soap.  After you use shampoo/soap, rinse your hair and body thoroughly to remove shampoo/soap residue.  Turn the water OFF and apply about 3 tablespoons (45 ml) of CHG soap to a CLEAN washcloth.  Apply CHG soap ONLY FROM YOUR NECK DOWN TO YOUR TOES (washing for 3-5 minutes)  DO NOT use CHG soap on face, private areas, open wounds, or sores.  Pay special attention to the area where your surgery is being performed.  If you are having back surgery, having someone wash your back for you may be helpful. Wait 2 minutes after CHG soap is applied, then you may rinse off the CHG soap.  Pat dry with a clean towel  Put on clean clothes/pajamas   If you choose to wear lotion, please use ONLY the CHG-compatible lotions on the back of this paper.     Additional instructions for the day of surgery: DO NOT APPLY any lotions, deodorants, cologne, or perfumes.  Put on clean/comfortable clothes.  Brush your teeth.  Ask your nurse before applying any prescription medications to the skin.      CHG Compatible Lotions   Aveeno Moisturizing lotion  Cetaphil Moisturizing Cream  Cetaphil Moisturizing Lotion  Clairol Herbal Essence Moisturizing Lotion, Dry Skin  Clairol Herbal Essence Moisturizing Lotion, Extra Dry Skin  Clairol Herbal Essence Moisturizing Lotion, Normal Skin  Curel Age Defying Therapeutic Moisturizing Lotion with Alpha Hydroxy  Curel Extreme Care Body Lotion  Curel Soothing Hands Moisturizing Hand Lotion  Curel Therapeutic Moisturizing Cream, Fragrance-Free  Curel Therapeutic Moisturizing Lotion, Fragrance-Free  Curel Therapeutic Moisturizing Lotion, Original Formula  Eucerin Daily Replenishing Lotion  Eucerin Dry Skin Therapy Plus Alpha Hydroxy Crme  Eucerin Dry Skin Therapy Plus Alpha Hydroxy Lotion  Eucerin Original Crme  Eucerin Original Lotion  Eucerin Plus Crme Eucerin Plus Lotion  Eucerin TriLipid Replenishing Lotion  Keri Anti-Bacterial Hand  Lotion  Keri Deep Conditioning Original Lotion Dry Skin Formula Softly Scented  Keri Deep Conditioning Original Lotion, Fragrance Free Sensitive Skin Formula  Keri Lotion Fast Absorbing Fragrance Free Sensitive Skin Formula  Keri Lotion Fast Absorbing Softly Scented Dry Skin Formula  Keri Original Lotion  Keri Skin Renewal Lotion Keri Silky Smooth Lotion  Keri Silky Smooth Sensitive Skin Lotion  Nivea Body Creamy Conditioning Oil  Nivea Body Extra Enriched Lotion  Nivea Body Original Lotion  Nivea Body Sheer Moisturizing Lotion Nivea Crme  Nivea Skin Firming Lotion  NutraDerm 30 Skin Lotion  NutraDerm Skin Lotion  NutraDerm Therapeutic Skin Cream  NutraDerm Therapeutic Skin Lotion  ProShield Protective Hand Cream  Provon moisturizing lotion  How to Use an Incentive Spirometer  An incentive spirometer is a tool that measures how well you are filling your lungs with each breath. Learning to take long, deep breaths using this tool can help you keep your lungs clear and active. This may help to reverse or lessen your chance of developing breathing (pulmonary) problems, especially infection. You may be asked to use a spirometer: After a surgery. If you have a lung problem or a history of smoking. After a long period of time when you have been unable to move or be active. If the spirometer includes an indicator to show the highest number that you have reached, your health care provider or respiratory therapist will help you set a goal. Keep a log of your progress as told by your health care provider. What are the risks? Breathing too quickly may cause dizziness or cause you to pass out. Take your time so you do not get dizzy or light-headed. If you are in pain, you may need to take pain medicine before doing incentive spirometry. It is harder to take a deep breath if you are having pain. How to use your incentive spirometer  Sit up on the edge of your bed or on a chair. Hold the incentive  spirometer so that it is in an upright position. Before you use the spirometer, breathe out normally. Place the mouthpiece in your mouth. Make sure your lips are closed tightly around it. Breathe in slowly and as deeply as you can through your mouth, causing the piston or the ball to rise toward the top of the chamber. Hold your breath for 3-5 seconds, or for as long as possible. If the spirometer includes a coach indicator, use this to guide you in breathing. Slow down your breathing if the indicator goes above the marked areas. Remove the mouthpiece from your mouth and breathe out  normally. The piston or ball will return to the bottom of the chamber. Rest for a few seconds, then repeat the steps 10 or more times. Take your time and take a few normal breaths between deep breaths so that you do not get dizzy or light-headed. Do this every 1-2 hours when you are awake. If the spirometer includes a goal marker to show the highest number you have reached (best effort), use this as a goal to work toward during each repetition. After each set of 10 deep breaths, cough a few times. This will help to make sure that your lungs are clear. If you have an incision on your chest or abdomen from surgery, place a pillow or a rolled-up towel firmly against the incision when you cough. This can help to reduce pain while taking deep breaths and coughing. General tips When you are able to get out of bed: Walk around often. Continue to take deep breaths and cough in order to clear your lungs. Keep using the incentive spirometer until your health care provider says it is okay to stop using it. If you have been in the hospital, you may be told to keep using the spirometer at home. Contact a health care provider if: You are having difficulty using the spirometer. You have trouble using the spirometer as often as instructed. Your pain medicine is not giving enough relief for you to use the spirometer as told. You  have a fever. Get help right away if: You develop shortness of breath. You develop a cough with bloody mucus from the lungs. You have fluid or blood coming from an incision site after you cough. Summary An incentive spirometer is a tool that can help you learn to take long, deep breaths to keep your lungs clear and active. You may be asked to use a spirometer after a surgery, if you have a lung problem or a history of smoking, or if you have been inactive for a long period of time. Use your incentive spirometer as instructed every 1-2 hours while you are awake. If you have an incision on your chest or abdomen, place a pillow or a rolled-up towel firmly against your incision when you cough. This will help to reduce pain. Get help right away if you have shortness of breath, you cough up bloody mucus, or blood comes from your incision when you cough. This information is not intended to replace advice given to you by your health care provider. Make sure you discuss any questions you have with your health care provider. Document Revised: 06/21/2019 Document Reviewed: 06/21/2019 Elsevier Patient Education  2023 [employer].

## 2023-11-24 ENCOUNTER — Ambulatory Visit: Admitting: Family Medicine

## 2023-11-24 ENCOUNTER — Ambulatory Visit: Payer: Self-pay | Admitting: Family Medicine

## 2023-11-24 ENCOUNTER — Encounter: Payer: Self-pay | Admitting: Family Medicine

## 2023-11-24 VITALS — BP 134/80 | HR 90 | Temp 98.2°F | Ht 61.0 in | Wt 176.4 lb

## 2023-11-24 DIAGNOSIS — R7303 Prediabetes: Secondary | ICD-10-CM

## 2023-11-24 DIAGNOSIS — I10 Essential (primary) hypertension: Secondary | ICD-10-CM

## 2023-11-24 DIAGNOSIS — Z01818 Encounter for other preprocedural examination: Secondary | ICD-10-CM | POA: Diagnosis not present

## 2023-11-24 LAB — POCT GLYCOSYLATED HEMOGLOBIN (HGB A1C): Hemoglobin A1C: 6 % — AB (ref 4.0–5.6)

## 2023-11-24 NOTE — Patient Instructions (Addendum)
 For prediabetes Try to get most of your carbohydrates from produce (with the exception of white potatoes) and whole grains Eat less bread/pasta/rice/snack foods/cereals/sweets and other items from the middle of the grocery store (processed carbs)  Eat more lean protein  The following are examples of protein in diet  Meat (lean)  Fish  Eggs  Dairy products  Soy products  Oat milk  Almond milk Nuts and nut butters  Legumes  Dried beans     Make sure to hold your meloxicam  7 days pre surgery (or per Dr CINDERELLA)   A1c today

## 2023-11-24 NOTE — Progress Notes (Signed)
 Subjective:    Patient ID: Sierra Sparks, female    DOB: 1941/02/17, 83 y.o.   MRN: [id number]  HPI  Wt Readings from Last 3 Encounters:  11/24/23 176 lb 6 oz (80 kg)  11/21/23 173 lb (78.5 kg)  11/13/23 174 lb (78.9 kg)   33.33 kg/m  Vitals:   11/24/23 1530 11/24/23 1600  BP: (!) 148/94 134/80  Pulse: 90   Temp: 98.2 F (36.8 C)   SpO2: 94%     Pt presents for pre surgical eval and clearance Check of chronic health problems  HTN Prediabetes   Planning lumbar laminectomy/ decompression microdiscectomy 4 level with Dr Clois Some bad disks  Cannot do a fusion due for osteoporosis Is in a decent amount of discomfort   Used to walk at the mall Wants to get back to it   No drug allergies No blood thinners   Meloxicam  (for arthritis in foot)    HTN bp is stable today  No cp or palpitations or headaches or edema  No side effects to medicines  BP Readings from Last 3 Encounters:  11/24/23 134/80  11/21/23 (!) 176/89  11/13/23 (!) 144/92      Lab Results  Component Value Date   NA 141 11/21/2023   K 3.8 11/21/2023   CO2 23 11/21/2023   GLUCOSE 107 (H) 11/21/2023   BUN 21 11/21/2023   CREATININE 0.59 11/21/2023   CALCIUM  9.4 11/21/2023   GFR 83.13 05/30/2023   GFRNONAA >60 11/21/2023   Amlodipine  5 mg daily   Pulse Readings from Last 3 Encounters:  11/24/23 90  11/21/23 84  09/24/23 88    Prediabetes Lab Results  Component Value Date   HGBA1C 6.0 (A) 11/24/2023   HGBA1C 6.1 05/30/2023     Hyperlipidemia Lab Results  Component Value Date   CHOL 136 05/30/2023   HDL 46.10 05/30/2023   LDLCALC 59 05/30/2023   LDLDIRECT 143.2 08/05/2012   TRIG 153.0 (H) 05/30/2023   CHOLHDL 3 05/30/2023   Crestor  5 mg daily    On iron  currently  Lab Results  Component Value Date   WBC 4.4 11/21/2023   HGB 13.1 11/21/2023   HCT 40.0 11/21/2023   MCV 95.0 11/21/2023   PLT 239 11/21/2023   Had MRSA test neg  Blood type o positive with  neg ab screen   Pre operative EKG NSR rate of 76 with PAC  ? Past inferior infarct/no change from 2-23   Reviewed last note from cardiology/ Dr Perla fro 2023 and PACs were also noted then   Patient Active Problem List   Diagnosis Date Noted   Pre-operative clearance 11/24/2023   Falls frequently 06/12/2023   Peripheral vertigo 12/17/2022   Prediabetes 05/23/2022   Irregular heart beat 06/22/2021   Colon cancer screening 05/18/2021   Encounter for screening mammogram for breast cancer 03/31/2017   Hearing loss in right ear 08/30/2016   Anal fissure 03/06/2016   Internal hemorrhoids 02/26/2016   Routine general medical examination at a health care facility 02/03/2015   Estrogen deficiency 02/03/2015   Encounter for Medicare annual wellness exam 08/12/2012   Glaucoma 08/12/2012   History of colon polyps 05/15/2011   Anemia 05/15/2011   Allergic rhinitis 03/02/2008   Osteoporosis 01/05/2007   HYPERCHOLESTEROLEMIA 10/24/2006   Depression with anxiety 10/24/2006   Essential hypertension 10/24/2006   HEMORRHOIDS 10/24/2006   FIBROCYSTIC BREAST DISEASE 10/24/2006   OSTEOARTHRITIS 10/24/2006   Past Medical History:  Diagnosis Date  Allergic rhinitis, cause unspecified    Allergy    Anemia    Anxiety state, unspecified    Arthritis    Cataract    forming    Colon polyps    Depressive disorder, not elsewhere classified    Diffuse cystic mastopathy    Disorder of bone and cartilage, unspecified    Diverticulosis    External hemorrhoids    Gallstones    GERD (gastroesophageal reflux disease)    Glaucoma    narrow angle thatwas corrected with laser surgery    Osteoarthrosis, unspecified whether generalized or localized, unspecified site    hands   Osteoporosis    Pre-diabetes    Pure hypercholesterolemia    Thyroid  disease    years past- no meds currently    Unspecified essential hypertension    Unspecified hemorrhoids without mention of complication    Past  Surgical History:  Procedure Laterality Date   BREAST BIOPSY Left 08/11/2020   11:00 3cmfn Q clip u/s bx papilloma   BREAST EXCISIONAL BIOPSY Left 08/25/2020   surgical exc papilloma with tag placement tag no.[id number]   BREAST LUMPECTOMY WITH RADIO FREQUENCY LOCALIZER Left 08/25/2020   Procedure: BREAST LUMPECTOMY WITH RADIO FREQUENCY LOCALIZER;  Surgeon: Tye Millet, DO;  Location: [facility];  Service: General;  Laterality: Left;   BUNIONECTOMY Left 10/1999   CESAREAN SECTION  1981   CHOLECYSTECTOMY     COLONOSCOPY  01/2002   polyp; hemorrhoids;diverticulosis   COLONOSCOPY  2013   diverticulosis   DEXA  06/2000   osteopenia   DEXA  04/2003   Stable   HAND SURGERY Left    DIP fusion-left   HEMORRHOID SURGERY     POLYPECTOMY     TONSILLECTOMY     childhood   Social History   Tobacco Use   Smoking status: Never   Smokeless tobacco: Never  Vaping Use   Vaping status: Never Used  Substance Use Topics   Alcohol use: Yes    Alcohol/week: 3.0 standard drinks of alcohol    Types: 3 Glasses of wine per week    Comment: Occasional-wine, 3 per week   Drug use: No   Family History  Problem Relation Age of Onset   Stroke Mother    Coronary artery disease Mother 33   Hypertension Mother    Hyperlipidemia Mother    Arthritis Father    Lung cancer Father        + smoker   Hypertension Sister    Migraines Sister    Colon cancer Brother        ? primary colon   Liver cancer Brother    Cancer Brother        type unknown   Stroke Maternal Grandmother        hemorrhagic   Cancer Maternal Grandmother        female cancer type unknown   Bone cancer Paternal Uncle    Esophageal cancer Neg Hx    Rectal cancer Neg Hx    Stomach cancer Neg Hx    Breast cancer Neg Hx    Colon polyps Neg Hx    No Known Allergies Current Outpatient Medications on File Prior to Visit  Medication Sig Dispense Refill   acetaminophen  (TYLENOL ) 500 MG tablet Take 1,000 mg by mouth in the morning.      amLODipine  (NORVASC ) 5 MG tablet Take 1 tablet by mouth once daily 90 tablet 1   Ascorbic Acid (VITAMIN C) 500 MG CAPS Take 500  mg by mouth in the morning.     Calcium  Carbonate-Vit D-Min (CALCIUM  1200) 1200-1000 MG-UNIT CHEW Chew 600 mg by mouth 2 (two) times daily.     diazepam  (VALIUM ) 2 MG tablet Take 2 mg by mouth every 8 (eight) hours as needed (vertigo).     ezetimibe  (ZETIA ) 10 MG tablet Take 1 tablet by mouth once daily 90 tablet 1   famotidine -calcium  carbonate-magnesium  hydroxide (PEPCID  COMPLETE) 10-800-165 MG chewable tablet Chew 1 tablet by mouth daily as needed.     FLUoxetine  (PROZAC ) 20 MG capsule Take 1 capsule by mouth once daily 90 capsule 1   fluticasone  (FLONASE ) 50 MCG/ACT nasal spray Place 2 sprays into both nostrils daily. 16 g 6   hydrocortisone  (ANUSOL -HC) 25 MG suppository Place 1 suppository (25 mg total) rectally at bedtime. (Patient taking differently: Place 25 mg rectally daily as needed for hemorrhoids.) 12 suppository 1   iron  polysaccharides (FERREX 150) 150 MG capsule Take 1 capsule by mouth twice daily 180 capsule 2   meclizine  (ANTIVERT ) 25 MG tablet Take 1 tablet (25 mg total) by mouth 3 (three) times daily as needed for dizziness. 30 tablet 0   meloxicam  (MOBIC ) 7.5 MG tablet Take 7.5 mg by mouth in the morning.     Multiple Vitamin (MULTIVITAMIN WITH MINERALS) TABS tablet Take 1 tablet by mouth in the morning.     rosuvastatin  (CRESTOR ) 5 MG tablet Take 1 tablet by mouth once daily 90 tablet 1   vitamin D3 (CHOLECALCIFEROL) 25 MCG tablet Take 1,000 Units by mouth in the morning.     No current facility-administered medications on file prior to visit.    Review of Systems  Constitutional:  Negative for activity change, appetite change, fatigue, fever and unexpected weight change.  HENT:  Negative for congestion, ear pain, rhinorrhea, sinus pressure and sore throat.   Eyes:  Negative for pain, redness and visual disturbance.  Respiratory:  Negative for  cough, shortness of breath and wheezing.   Cardiovascular:  Negative for chest pain and palpitations.  Gastrointestinal:  Negative for abdominal pain, blood in stool, constipation and diarrhea.  Endocrine: Negative for polydipsia and polyuria.  Genitourinary:  Negative for dysuria, frequency and urgency.  Musculoskeletal:  Positive for back pain. Negative for arthralgias and myalgias.  Skin:  Negative for pallor and rash.  Allergic/Immunologic: Negative for environmental allergies.  Neurological:  Negative for dizziness, syncope and headaches.  Hematological:  Negative for adenopathy. Does not bruise/bleed easily.  Psychiatric/Behavioral:  Negative for decreased concentration and dysphoric mood. The patient is not nervous/anxious.        Objective:   Physical Exam Constitutional:      General: She is not in acute distress.    Appearance: Normal appearance. She is well-developed. She is not ill-appearing or diaphoretic.  HENT:     Head: Normocephalic and atraumatic.  Eyes:     Conjunctiva/sclera: Conjunctivae normal.     Pupils: Pupils are equal, round, and reactive to light.  Neck:     Thyroid : No thyromegaly.     Vascular: No carotid bruit or JVD.  Cardiovascular:     Rate and Rhythm: Normal rate and regular rhythm.     Heart sounds: Normal heart sounds.     No gallop.  Pulmonary:     Effort: Pulmonary effort is normal. No respiratory distress.     Breath sounds: Normal breath sounds. No wheezing or rales.  Abdominal:     General: There is no distension or abdominal bruit.  Palpations: Abdomen is soft.  Musculoskeletal:     Cervical back: Normal range of motion and neck supple.     Right lower leg: No edema.     Left lower leg: No edema.     Comments: Limited rom LS  Slow gait   Lymphadenopathy:     Cervical: No cervical adenopathy.  Skin:    General: Skin is warm and dry.     Coloration: Skin is not jaundiced or pale.     Findings: No bruising or rash.   Neurological:     Mental Status: She is alert.     Cranial Nerves: No cranial nerve deficit.     Coordination: Coordination normal.     Deep Tendon Reflexes: Reflexes are normal and symmetric. Reflexes normal.  Psychiatric:        Mood and Affect: Mood normal.           Assessment & Plan:   Problem List Items Addressed This Visit       Cardiovascular and Mediastinum   Essential hypertension   bp in fair control at this time  BP Readings from Last 1 Encounters:  11/24/23 134/80   No changes needed Most recent labs reviewed  Disc lifstyle change with low sodium diet and exercise  Plan to continue amlodipine  5 mg daily          Other   Prediabetes   Lab Results  Component Value Date   HGBA1C 6.0 (A) 11/24/2023   HGBA1C 6.1 05/30/2023   Has been eating poorly  disc imp of low glycemic diet and wt loss to prevent DM2       Relevant Orders   POCT glycosylated hemoglobin (Hb A1C) (Completed)   Pre-operative clearance - Primary   For lumbar laminectomy / microdiscectomy 4 levels/ Dr Clois  With HTN and prediabetes and hyperlipidemia Chronic conditions stable  Blood pressure better on 2nd check No history of clots or blood thinners  Will hold meloxicam  at least 1 wk pre surgery  No notable drug or other allergies  Reviewed EKG (no acute changes) from hospital  Reviewed last cardiology note from Dr Gollan in 2023  No pulmonary issues  No restrictions for surgery

## 2023-11-24 NOTE — Assessment & Plan Note (Addendum)
 For lumbar laminectomy / microdiscectomy 4 levels/ Dr Clois  With HTN and prediabetes and hyperlipidemia Chronic conditions stable  Blood pressure better on 2nd check No history of clots or blood thinners  Will hold meloxicam  at least 1 wk pre surgery  No notable drug or other allergies  Reviewed EKG (no acute changes) from hospital  Reviewed last cardiology note from Dr Gollan in 2023  No pulmonary issues  No restrictions for surgery

## 2023-11-24 NOTE — Assessment & Plan Note (Signed)
 bp in fair control at this time  BP Readings from Last 1 Encounters:  11/24/23 134/80   No changes needed Most recent labs reviewed  Disc lifstyle change with low sodium diet and exercise  Plan to continue amlodipine  5 mg daily

## 2023-11-24 NOTE — Assessment & Plan Note (Signed)
 Lab Results  Component Value Date   HGBA1C 6.0 (A) 11/24/2023   HGBA1C 6.1 05/30/2023   Has been eating poorly  disc imp of low glycemic diet and wt loss to prevent DM2

## 2023-11-26 ENCOUNTER — Other Ambulatory Visit: Payer: Self-pay | Admitting: Family Medicine

## 2023-11-28 NOTE — Anesthesia Preprocedure Evaluation (Addendum)
 Anesthesia Evaluation  Patient identified by MRN, date of birth, ID band Patient awake    Reviewed: Allergy & Precautions, H&P , NPO status , Patient's Chart, lab work & pertinent test results  Airway Mallampati: II  TM Distance: >3 FB Neck ROM: full    Dental no notable dental hx.    Pulmonary neg pulmonary ROS   Pulmonary exam normal        Cardiovascular hypertension, Normal cardiovascular exam     Neuro/Psych  PSYCHIATRIC DISORDERS Anxiety Depression    chronic bilateral low back pain with bilateral sciatica  Neuromuscular disease    GI/Hepatic negative GI ROS, Neg liver ROS,,,  Endo/Other  negative endocrine ROS    Renal/GU      Musculoskeletal   Abdominal  (+) + obese  Peds  Hematology negative hematology ROS (+)   Anesthesia Other Findings Past Medical History: No date: Allergic rhinitis, cause unspecified No date: Allergy No date: Anemia No date: Anxiety state, unspecified No date: Arthritis No date: Cataract     Comment:  forming  No date: Colon polyps No date: Depressive disorder, not elsewhere classified No date: Diffuse cystic mastopathy No date: Disorder of bone and cartilage, unspecified No date: Diverticulosis No date: External hemorrhoids No date: Gallstones No date: GERD (gastroesophageal reflux disease) No date: Glaucoma     Comment:  narrow angle thatwas corrected with laser surgery  No date: Osteoarthrosis, unspecified whether generalized or localized,  unspecified site     Comment:  hands No date: Osteoporosis No date: Pre-diabetes No date: Pure hypercholesterolemia No date: Thyroid  disease     Comment:  years past- no meds currently  No date: Unspecified essential hypertension No date: Unspecified hemorrhoids without mention of complication  Past Surgical History: 08/11/2020: BREAST BIOPSY; Left     Comment:  11:00 3cmfn Q clip u/s bx papilloma 08/25/2020: BREAST EXCISIONAL  BIOPSY; Left     Comment:  surgical exc papilloma with tag placement tag no.[id number] 08/25/2020: BREAST LUMPECTOMY WITH RADIO FREQUENCY LOCALIZER; Left     Comment:  Procedure: BREAST LUMPECTOMY WITH RADIO FREQUENCY               LOCALIZER;  Surgeon: Tye Millet, DO;  Location: [facility]               ORS;  Service: General;  Laterality: Left; 10/1999: BUNIONECTOMY; Left 1981: CESAREAN SECTION No date: CHOLECYSTECTOMY 01/2002: COLONOSCOPY     Comment:  polyp; hemorrhoids;diverticulosis 2013: COLONOSCOPY     Comment:  diverticulosis 06/2000: DEXA     Comment:  osteopenia 04/2003: DEXA     Comment:  Stable No date: HAND SURGERY; Left     Comment:  DIP fusion-left No date: HEMORRHOID SURGERY No date: POLYPECTOMY No date: TONSILLECTOMY     Comment:  childhood     Reproductive/Obstetrics negative OB ROS                              Anesthesia Physical Anesthesia Plan  ASA: 2  Anesthesia Plan: General ETT   Post-op Pain Management: Tylenol  PO (pre-op)* and Celebrex  PO (pre-op)*   Induction: Intravenous  PONV Risk Score and Plan: 2 and Ondansetron , Dexamethasone  and Midazolam   Airway Management Planned: Oral ETT  Additional Equipment:   Intra-op Plan:   Post-operative Plan: Extubation in OR  Informed Consent: I have reviewed the patients History and Physical, chart, labs and discussed the procedure including the risks, benefits and alternatives for the proposed  anesthesia with the patient or authorized representative who has indicated his/her understanding and acceptance.     Dental Advisory Given  Plan Discussed with: CRNA and Surgeon  Anesthesia Plan Comments:          Anesthesia Quick Evaluation

## 2023-12-01 ENCOUNTER — Ambulatory Visit

## 2023-12-01 ENCOUNTER — Ambulatory Visit: Payer: Self-pay | Admitting: Urgent Care

## 2023-12-01 ENCOUNTER — Other Ambulatory Visit: Payer: Self-pay

## 2023-12-01 ENCOUNTER — Inpatient Hospital Stay: Payer: Self-pay | Admitting: Anesthesiology

## 2023-12-01 ENCOUNTER — Encounter
Admission: RE | Disposition: A | Discharge status: Skilled Nursing Facility | Payer: Self-pay | Source: Home / Self Care | Attending: Neurosurgery

## 2023-12-01 ENCOUNTER — Encounter: Payer: Self-pay | Admitting: Neurosurgery

## 2023-12-01 ENCOUNTER — Inpatient Hospital Stay
Admission: RE | Admit: 2023-12-01 | Discharge: 2023-12-09 | DRG: 519 | Disposition: A | Discharge status: Skilled Nursing Facility | Attending: Neurosurgery | Admitting: Neurosurgery

## 2023-12-01 DIAGNOSIS — K625 Hemorrhage of anus and rectum: Secondary | ICD-10-CM | POA: Diagnosis not present

## 2023-12-01 DIAGNOSIS — F32A Depression, unspecified: Secondary | ICD-10-CM | POA: Diagnosis present

## 2023-12-01 DIAGNOSIS — Z83438 Family history of other disorder of lipoprotein metabolism and other lipidemia: Secondary | ICD-10-CM | POA: Diagnosis not present

## 2023-12-01 DIAGNOSIS — M549 Dorsalgia, unspecified: Secondary | ICD-10-CM | POA: Diagnosis not present

## 2023-12-01 DIAGNOSIS — I10 Essential (primary) hypertension: Secondary | ICD-10-CM | POA: Diagnosis not present

## 2023-12-01 DIAGNOSIS — R7303 Prediabetes: Secondary | ICD-10-CM | POA: Diagnosis present

## 2023-12-01 DIAGNOSIS — Z791 Long term (current) use of non-steroidal anti-inflammatories (NSAID): Secondary | ICD-10-CM | POA: Diagnosis not present

## 2023-12-01 DIAGNOSIS — Z823 Family history of stroke: Secondary | ICD-10-CM | POA: Diagnosis not present

## 2023-12-01 DIAGNOSIS — K602 Anal fissure, unspecified: Secondary | ICD-10-CM | POA: Diagnosis present

## 2023-12-01 DIAGNOSIS — K5730 Diverticulosis of large intestine without perforation or abscess without bleeding: Secondary | ICD-10-CM | POA: Diagnosis not present

## 2023-12-01 DIAGNOSIS — H409 Unspecified glaucoma: Secondary | ICD-10-CM | POA: Diagnosis present

## 2023-12-01 DIAGNOSIS — M545 Low back pain, unspecified: Secondary | ICD-10-CM | POA: Diagnosis not present

## 2023-12-01 DIAGNOSIS — M5442 Lumbago with sciatica, left side: Secondary | ICD-10-CM | POA: Diagnosis present

## 2023-12-01 DIAGNOSIS — J309 Allergic rhinitis, unspecified: Secondary | ICD-10-CM | POA: Diagnosis not present

## 2023-12-01 DIAGNOSIS — Z7401 Bed confinement status: Secondary | ICD-10-CM | POA: Diagnosis not present

## 2023-12-01 DIAGNOSIS — N390 Urinary tract infection, site not specified: Secondary | ICD-10-CM | POA: Diagnosis not present

## 2023-12-01 DIAGNOSIS — G9600 Cerebrospinal fluid leak, unspecified: Secondary | ICD-10-CM

## 2023-12-01 DIAGNOSIS — E7800 Pure hypercholesterolemia, unspecified: Secondary | ICD-10-CM | POA: Diagnosis not present

## 2023-12-01 DIAGNOSIS — Z8249 Family history of ischemic heart disease and other diseases of the circulatory system: Secondary | ICD-10-CM

## 2023-12-01 DIAGNOSIS — F411 Generalized anxiety disorder: Secondary | ICD-10-CM | POA: Diagnosis present

## 2023-12-01 DIAGNOSIS — F329 Major depressive disorder, single episode, unspecified: Secondary | ICD-10-CM | POA: Diagnosis not present

## 2023-12-01 DIAGNOSIS — M48062 Spinal stenosis, lumbar region with neurogenic claudication: Secondary | ICD-10-CM

## 2023-12-01 DIAGNOSIS — R0989 Other specified symptoms and signs involving the circulatory and respiratory systems: Secondary | ICD-10-CM | POA: Diagnosis not present

## 2023-12-01 DIAGNOSIS — M5441 Lumbago with sciatica, right side: Secondary | ICD-10-CM | POA: Diagnosis present

## 2023-12-01 DIAGNOSIS — R918 Other nonspecific abnormal finding of lung field: Secondary | ICD-10-CM | POA: Diagnosis not present

## 2023-12-01 DIAGNOSIS — Z8261 Family history of arthritis: Secondary | ICD-10-CM

## 2023-12-01 DIAGNOSIS — K219 Gastro-esophageal reflux disease without esophagitis: Secondary | ICD-10-CM | POA: Diagnosis not present

## 2023-12-01 DIAGNOSIS — G8929 Other chronic pain: Secondary | ICD-10-CM | POA: Diagnosis not present

## 2023-12-01 DIAGNOSIS — Z0189 Encounter for other specified special examinations: Secondary | ICD-10-CM | POA: Diagnosis not present

## 2023-12-01 DIAGNOSIS — M810 Age-related osteoporosis without current pathological fracture: Secondary | ICD-10-CM | POA: Diagnosis present

## 2023-12-01 DIAGNOSIS — Z79899 Other long term (current) drug therapy: Secondary | ICD-10-CM | POA: Diagnosis not present

## 2023-12-01 DIAGNOSIS — K449 Diaphragmatic hernia without obstruction or gangrene: Secondary | ICD-10-CM | POA: Diagnosis not present

## 2023-12-01 DIAGNOSIS — R600 Localized edema: Secondary | ICD-10-CM | POA: Diagnosis not present

## 2023-12-01 DIAGNOSIS — H81399 Other peripheral vertigo, unspecified ear: Secondary | ICD-10-CM | POA: Diagnosis not present

## 2023-12-01 DIAGNOSIS — Z9889 Other specified postprocedural states: Principal | ICD-10-CM

## 2023-12-01 DIAGNOSIS — N200 Calculus of kidney: Secondary | ICD-10-CM | POA: Diagnosis not present

## 2023-12-01 DIAGNOSIS — Z01818 Encounter for other preprocedural examination: Secondary | ICD-10-CM

## 2023-12-01 DIAGNOSIS — I959 Hypotension, unspecified: Secondary | ICD-10-CM | POA: Diagnosis not present

## 2023-12-01 DIAGNOSIS — Z4889 Encounter for other specified surgical aftercare: Secondary | ICD-10-CM | POA: Diagnosis not present

## 2023-12-01 DIAGNOSIS — R509 Fever, unspecified: Secondary | ICD-10-CM | POA: Diagnosis not present

## 2023-12-01 HISTORY — PX: LUMBAR LAMINECTOMY/DECOMPRESSION MICRODISCECTOMY: SHX5026

## 2023-12-01 HISTORY — DX: Spinal stenosis, lumbar region with neurogenic claudication: M48.062

## 2023-12-01 HISTORY — DX: Cerebrospinal fluid leak, unspecified: G96.00

## 2023-12-01 LAB — MRSA NEXT GEN BY PCR, NASAL: MRSA by PCR Next Gen: NOT DETECTED

## 2023-12-01 LAB — GLUCOSE, CAPILLARY: Glucose-Capillary: 210 mg/dL — ABNORMAL HIGH (ref 70–99)

## 2023-12-01 LAB — ABO/RH: ABO/RH(D): O POS

## 2023-12-01 SURGERY — LUMBAR LAMINECTOMY/DECOMPRESSION MICRODISCECTOMY 4 LEVEL
Anesthesia: General

## 2023-12-01 MED ORDER — ONDANSETRON HCL 4 MG/2ML IJ SOLN: 4.0000 mg | Freq: Four times a day (QID) | INTRAMUSCULAR | Status: DC | PRN

## 2023-12-01 MED ORDER — FIBRIN SEALANT 2 ML SINGLE DOSE KIT
PACK | CUTANEOUS | Status: DC | PRN
  Administered 2023-12-01: 2 mL via TOPICAL

## 2023-12-01 MED ORDER — HYDROMORPHONE HCL 1 MG/ML IJ SOLN
INTRAMUSCULAR | Status: AC
Start: 2023-12-01 — End: 2023-12-01
  Filled 2023-12-01: qty 1

## 2023-12-01 MED ORDER — HYDROCORTISONE ACETATE 25 MG RE SUPP
25.0000 mg | Freq: Every day | RECTAL | Status: DC | PRN
Start: 2023-12-01 — End: 2023-12-09

## 2023-12-01 MED ORDER — CHLORHEXIDINE GLUCONATE CLOTH 2 % EX PADS
6.0000 | MEDICATED_PAD | Freq: Every day | CUTANEOUS | Status: DC
  Administered 2023-12-01 – 2023-12-05 (×5): 6 via TOPICAL

## 2023-12-01 MED ORDER — DIAZEPAM 2 MG PO TABS: 2.0000 mg | ORAL_TABLET | Freq: Three times a day (TID) | ORAL | Status: DC | PRN

## 2023-12-01 MED ORDER — SORBITOL 70 % SOLN: 30.0000 mL | Freq: Every day | Status: DC | PRN

## 2023-12-01 MED ORDER — ROSUVASTATIN CALCIUM 5 MG PO TABS
5.0000 mg | ORAL_TABLET | Freq: Every day | ORAL | Status: DC
  Administered 2023-12-01 – 2023-12-08 (×8): 5 mg via ORAL
  Filled 2023-12-01 (×8): qty 1

## 2023-12-01 MED ORDER — DEXAMETHASONE SODIUM PHOSPHATE 10 MG/ML IJ SOLN
INTRAMUSCULAR | Status: AC
  Filled 2023-12-01: qty 1

## 2023-12-01 MED ORDER — OXYCODONE HCL 5 MG PO TABS
10.0000 mg | ORAL_TABLET | ORAL | Status: DC | PRN
  Administered 2023-12-03 – 2023-12-09 (×19): 10 mg via ORAL
  Filled 2023-12-01 (×20): qty 2

## 2023-12-01 MED ORDER — ACETAMINOPHEN 650 MG RE SUPP: 650.0000 mg | RECTAL | Status: DC | PRN

## 2023-12-01 MED ORDER — DEXAMETHASONE SODIUM PHOSPHATE 10 MG/ML IJ SOLN
INTRAMUSCULAR | Status: DC | PRN
  Administered 2023-12-01: 10 mg via INTRAVENOUS

## 2023-12-01 MED ORDER — FAMOTIDINE-CA CARB-MAG HYDROX 10-800-165 MG PO CHEW: 1.0000 | CHEWABLE_TABLET | Freq: Every day | ORAL | Status: DC | PRN

## 2023-12-01 MED ORDER — PHENYLEPHRINE HCL-NACL 20-0.9 MG/250ML-% IV SOLN
INTRAVENOUS | Status: DC | PRN
Start: 2023-12-01 — End: 2023-12-01
  Administered 2023-12-01: 40 ug/min via INTRAVENOUS

## 2023-12-01 MED ORDER — CELECOXIB 200 MG PO CAPS
200.0000 mg | ORAL_CAPSULE | Freq: Once | ORAL | Status: AC
  Administered 2023-12-01: 200 mg via ORAL

## 2023-12-01 MED ORDER — SODIUM CHLORIDE 0.9 % IV SOLN
250.0000 mL | INTRAVENOUS | Status: AC
  Administered 2023-12-01: 250 mL via INTRAVENOUS

## 2023-12-01 MED ORDER — MIDAZOLAM HCL 2 MG/2ML IJ SOLN
INTRAMUSCULAR | Status: AC
  Filled 2023-12-01: qty 2

## 2023-12-01 MED ORDER — REMIFENTANIL HCL 1 MG IV SOLR
INTRAVENOUS | Status: AC
Start: 2023-12-01 — End: 2023-12-01
  Filled 2023-12-01: qty 1000

## 2023-12-01 MED ORDER — SUCCINYLCHOLINE CHLORIDE 200 MG/10ML IV SOSY
PREFILLED_SYRINGE | INTRAVENOUS | Status: AC
  Filled 2023-12-01: qty 10

## 2023-12-01 MED ORDER — OXYCODONE HCL 5 MG PO TABS
5.0000 mg | ORAL_TABLET | ORAL | Status: DC | PRN
  Administered 2023-12-02 – 2023-12-09 (×8): 5 mg via ORAL
  Filled 2023-12-01 (×7): qty 1

## 2023-12-01 MED ORDER — ROCURONIUM BROMIDE 10 MG/ML (PF) SYRINGE
PREFILLED_SYRINGE | INTRAVENOUS | Status: DC | PRN
  Administered 2023-12-01: 10 mg via INTRAVENOUS
  Administered 2023-12-01 (×2): 20 mg via INTRAVENOUS

## 2023-12-01 MED ORDER — ENOXAPARIN SODIUM 40 MG/0.4ML IJ SOSY
40.0000 mg | PREFILLED_SYRINGE | INTRAMUSCULAR | Status: DC
  Administered 2023-12-02 – 2023-12-06 (×5): 40 mg via SUBCUTANEOUS
  Filled 2023-12-01 (×5): qty 0.4

## 2023-12-01 MED ORDER — LIDOCAINE HCL (CARDIAC) PF 100 MG/5ML IV SOSY
PREFILLED_SYRINGE | INTRAVENOUS | Status: DC | PRN
  Administered 2023-12-01: 100 mg via INTRAVENOUS

## 2023-12-01 MED ORDER — SODIUM CHLORIDE 0.9% FLUSH
3.0000 mL | Freq: Two times a day (BID) | INTRAVENOUS | Status: DC
  Administered 2023-12-01 – 2023-12-08 (×15): 3 mL via INTRAVENOUS

## 2023-12-01 MED ORDER — BUPIVACAINE-EPINEPHRINE (PF) 0.5% -1:200000 IJ SOLN
INTRAMUSCULAR | Status: DC | PRN
  Administered 2023-12-01: 10 mL

## 2023-12-01 MED ORDER — BUPIVACAINE-EPINEPHRINE (PF) 0.5% -1:200000 IJ SOLN
INTRAMUSCULAR | Status: AC
  Filled 2023-12-01: qty 20

## 2023-12-01 MED ORDER — FLUOXETINE HCL 20 MG PO CAPS
20.0000 mg | ORAL_CAPSULE | Freq: Every day | ORAL | Status: DC
  Administered 2023-12-02 – 2023-12-09 (×8): 20 mg via ORAL
  Filled 2023-12-01 (×8): qty 1

## 2023-12-01 MED ORDER — LIDOCAINE HCL (PF) 2 % IJ SOLN
INTRAMUSCULAR | Status: AC
  Filled 2023-12-01: qty 5

## 2023-12-01 MED ORDER — GLYCOPYRROLATE 0.2 MG/ML IJ SOLN
INTRAMUSCULAR | Status: AC
  Filled 2023-12-01: qty 1

## 2023-12-01 MED ORDER — LABETALOL HCL 5 MG/ML IV SOLN
INTRAVENOUS | Status: DC | PRN
  Administered 2023-12-01 (×2): 10 mg via INTRAVENOUS

## 2023-12-01 MED ORDER — METHOCARBAMOL 1000 MG/10ML IJ SOLN: 500.0000 mg | Freq: Four times a day (QID) | INTRAMUSCULAR | Status: DC | PRN

## 2023-12-01 MED ORDER — FENTANYL CITRATE (PF) 100 MCG/2ML IJ SOLN
INTRAMUSCULAR | Status: DC | PRN
  Administered 2023-12-01: 100 ug via INTRAVENOUS

## 2023-12-01 MED ORDER — ACETAMINOPHEN 500 MG PO TABS
1000.0000 mg | ORAL_TABLET | Freq: Four times a day (QID) | ORAL | Status: DC
  Administered 2023-12-02 – 2023-12-04 (×10): 1000 mg via ORAL
  Filled 2023-12-01 (×10): qty 2

## 2023-12-01 MED ORDER — SODIUM CHLORIDE 0.9 % IV SOLN
INTRAVENOUS | Status: DC | PRN
  Administered 2023-12-01: .07 ug/kg/min via INTRAVENOUS

## 2023-12-01 MED ORDER — HYDROMORPHONE HCL 1 MG/ML IJ SOLN
INTRAMUSCULAR | Status: DC | PRN
  Administered 2023-12-01 (×2): .1 mg via INTRAVENOUS
  Administered 2023-12-01 (×3): .2 mg via INTRAVENOUS
  Administered 2023-12-01: .1 mg via INTRAVENOUS

## 2023-12-01 MED ORDER — HYDRALAZINE HCL 20 MG/ML IJ SOLN
INTRAMUSCULAR | Status: AC
  Filled 2023-12-01: qty 1

## 2023-12-01 MED ORDER — ONDANSETRON HCL 4 MG/2ML IJ SOLN
INTRAMUSCULAR | Status: AC
  Filled 2023-12-01: qty 2

## 2023-12-01 MED ORDER — EZETIMIBE 10 MG PO TABS
10.0000 mg | ORAL_TABLET | Freq: Every day | ORAL | Status: DC
  Administered 2023-12-01 – 2023-12-08 (×8): 10 mg via ORAL
  Filled 2023-12-01 (×8): qty 1

## 2023-12-01 MED ORDER — METHYLPREDNISOLONE ACETATE 40 MG/ML IJ SUSP
INTRAMUSCULAR | Status: AC
  Filled 2023-12-01: qty 1

## 2023-12-01 MED ORDER — OXYCODONE HCL 5 MG PO TABS: 5.0000 mg | ORAL_TABLET | Freq: Once | ORAL | Status: DC | PRN

## 2023-12-01 MED ORDER — OXYCODONE HCL 5 MG/5ML PO SOLN: 5.0000 mg | Freq: Once | ORAL | Status: DC | PRN

## 2023-12-01 MED ORDER — FIBRIN SEALANT 2 ML SINGLE DOSE KIT
PACK | CUTANEOUS | Status: AC
  Filled 2023-12-01: qty 2

## 2023-12-01 MED ORDER — HYDRALAZINE HCL 20 MG/ML IJ SOLN
5.0000 mg | INTRAMUSCULAR | Status: DC | PRN
  Administered 2023-12-01 (×2): 5 mg via INTRAVENOUS

## 2023-12-01 MED ORDER — DEXMEDETOMIDINE HCL IN NACL 80 MCG/20ML IV SOLN
INTRAVENOUS | Status: DC | PRN
  Administered 2023-12-01: 4 ug via INTRAVENOUS
  Administered 2023-12-01: 8 ug via INTRAVENOUS

## 2023-12-01 MED ORDER — DOCUSATE SODIUM 100 MG PO CAPS
100.0000 mg | ORAL_CAPSULE | Freq: Two times a day (BID) | ORAL | Status: DC
  Administered 2023-12-01 – 2023-12-08 (×14): 100 mg via ORAL
  Filled 2023-12-01 (×14): qty 1

## 2023-12-01 MED ORDER — PROPOFOL 1000 MG/100ML IV EMUL
INTRAVENOUS | Status: AC
  Filled 2023-12-01: qty 100

## 2023-12-01 MED ORDER — ONDANSETRON HCL 4 MG/2ML IJ SOLN
INTRAMUSCULAR | Status: DC | PRN
Start: 2023-12-01 — End: 2023-12-01
  Administered 2023-12-01: 4 mg via INTRAVENOUS

## 2023-12-01 MED ORDER — SODIUM CHLORIDE (PF) 0.9 % IJ SOLN
INTRAMUSCULAR | Status: AC
Start: 2023-12-01 — End: 2023-12-01
  Filled 2023-12-01: qty 20

## 2023-12-01 MED ORDER — FENTANYL CITRATE (PF) 100 MCG/2ML IJ SOLN
INTRAMUSCULAR | Status: AC
  Filled 2023-12-01: qty 2

## 2023-12-01 MED ORDER — PHENYLEPHRINE HCL-NACL 20-0.9 MG/250ML-% IV SOLN
INTRAVENOUS | Status: AC
Start: 2023-12-01 — End: 2023-12-01
  Filled 2023-12-01: qty 250

## 2023-12-01 MED ORDER — CEFAZOLIN SODIUM-DEXTROSE 2-4 GM/100ML-% IV SOLN
INTRAVENOUS | Status: AC
  Filled 2023-12-01: qty 100

## 2023-12-01 MED ORDER — ACETAMINOPHEN 325 MG PO TABS
650.0000 mg | ORAL_TABLET | ORAL | Status: DC | PRN
  Filled 2023-12-01: qty 2

## 2023-12-01 MED ORDER — PROPOFOL 10 MG/ML IV BOLUS
INTRAVENOUS | Status: AC
  Filled 2023-12-01: qty 20

## 2023-12-01 MED ORDER — CELECOXIB 200 MG PO CAPS
ORAL_CAPSULE | ORAL | Status: AC
Start: 2023-12-01 — End: 2023-12-01
  Filled 2023-12-01: qty 1

## 2023-12-01 MED ORDER — SODIUM CHLORIDE (PF) 0.9 % IJ SOLN
INTRAMUSCULAR | Status: AC
  Filled 2023-12-01: qty 20

## 2023-12-01 MED ORDER — SENNA 8.6 MG PO TABS
1.0000 | ORAL_TABLET | Freq: Two times a day (BID) | ORAL | Status: DC
  Administered 2023-12-01 – 2023-12-06 (×10): 8.6 mg via ORAL
  Filled 2023-12-01 (×10): qty 1

## 2023-12-01 MED ORDER — ACETAMINOPHEN 10 MG/ML IV SOLN
1000.0000 mg | Freq: Once | INTRAVENOUS | Status: DC | PRN
  Administered 2023-12-01: 1000 mg via INTRAVENOUS

## 2023-12-01 MED ORDER — SODIUM CHLORIDE 0.9% FLUSH: 3.0000 mL | INTRAVENOUS | Status: DC | PRN

## 2023-12-01 MED ORDER — HYDROMORPHONE HCL 1 MG/ML IJ SOLN
0.5000 mg | INTRAMUSCULAR | Status: AC | PRN
  Administered 2023-12-02 – 2023-12-03 (×5): 0.5 mg via INTRAVENOUS
  Filled 2023-12-01 (×5): qty 1

## 2023-12-01 MED ORDER — POLYETHYLENE GLYCOL 3350 17 G PO PACK
17.0000 g | PACK | Freq: Every day | ORAL | Status: DC | PRN
  Administered 2023-12-04 – 2023-12-06 (×3): 17 g via ORAL
  Filled 2023-12-01 (×3): qty 1

## 2023-12-01 MED ORDER — FENTANYL CITRATE (PF) 100 MCG/2ML IJ SOLN
25.0000 ug | INTRAMUSCULAR | Status: DC | PRN
  Administered 2023-12-01 (×4): 50 ug via INTRAVENOUS

## 2023-12-01 MED ORDER — PROPOFOL 10 MG/ML IV BOLUS
INTRAVENOUS | Status: AC
  Filled 2023-12-01: qty 40

## 2023-12-01 MED ORDER — LACTATED RINGERS IV SOLN: INTRAVENOUS | Status: DC

## 2023-12-01 MED ORDER — MENTHOL 3 MG MT LOZG: 1.0000 | LOZENGE | OROMUCOSAL | Status: DC | PRN

## 2023-12-01 MED ORDER — CEFAZOLIN SODIUM-DEXTROSE 2-4 GM/100ML-% IV SOLN
2.0000 g | INTRAVENOUS | Status: AC
  Administered 2023-12-01 (×2): 2 g via INTRAVENOUS

## 2023-12-01 MED ORDER — ACETAMINOPHEN 10 MG/ML IV SOLN
INTRAVENOUS | Status: AC
  Filled 2023-12-01: qty 100

## 2023-12-01 MED ORDER — ONDANSETRON HCL 4 MG PO TABS: 4.0000 mg | ORAL_TABLET | Freq: Four times a day (QID) | ORAL | Status: DC | PRN

## 2023-12-01 MED ORDER — SODIUM CHLORIDE (PF) 0.9 % IJ SOLN: INTRAMUSCULAR | Status: DC | PRN

## 2023-12-01 MED ORDER — FLUTICASONE PROPIONATE 50 MCG/ACT NA SUSP: 2.0000 | Freq: Every day | NASAL | Status: DC | PRN

## 2023-12-01 MED ORDER — PHENOL 1.4 % MT LIQD: 1.0000 | OROMUCOSAL | Status: DC | PRN

## 2023-12-01 MED ORDER — CHLORHEXIDINE GLUCONATE 0.12 % MT SOLN
15.0000 mL | Freq: Once | OROMUCOSAL | Status: AC
  Administered 2023-12-01: 15 mL via OROMUCOSAL

## 2023-12-01 MED ORDER — PHENYLEPHRINE 80 MCG/ML (10ML) SYRINGE FOR IV PUSH (FOR BLOOD PRESSURE SUPPORT)
PREFILLED_SYRINGE | INTRAVENOUS | Status: DC | PRN
  Administered 2023-12-01: 80 ug via INTRAVENOUS

## 2023-12-01 MED ORDER — SUCCINYLCHOLINE CHLORIDE 200 MG/10ML IV SOSY
PREFILLED_SYRINGE | INTRAVENOUS | Status: DC | PRN
  Administered 2023-12-01: 100 mg via INTRAVENOUS

## 2023-12-01 MED ORDER — 0.9 % SODIUM CHLORIDE (POUR BTL) OPTIME
TOPICAL | Status: DC | PRN
  Administered 2023-12-01: 500 mL

## 2023-12-01 MED ORDER — BUPIVACAINE LIPOSOME 1.3 % IJ SUSP
INTRAMUSCULAR | Status: AC
  Filled 2023-12-01: qty 20

## 2023-12-01 MED ORDER — CEFAZOLIN SODIUM 1 G IJ SOLR
INTRAMUSCULAR | Status: AC
  Filled 2023-12-01: qty 20

## 2023-12-01 MED ORDER — MAGNESIUM CITRATE PO SOLN: 1.0000 | Freq: Once | ORAL | Status: DC | PRN

## 2023-12-01 MED ORDER — CHLORHEXIDINE GLUCONATE 0.12 % MT SOLN
OROMUCOSAL | Status: AC
  Filled 2023-12-01: qty 15

## 2023-12-01 MED ORDER — EPHEDRINE 5 MG/ML INJ
INTRAVENOUS | Status: AC
  Filled 2023-12-01: qty 5

## 2023-12-01 MED ORDER — ACETAMINOPHEN 500 MG PO TABS
1000.0000 mg | ORAL_TABLET | Freq: Once | ORAL | Status: AC
  Administered 2023-12-01: 1000 mg via ORAL

## 2023-12-01 MED ORDER — ACETAMINOPHEN 500 MG PO TABS
ORAL_TABLET | ORAL | Status: AC
  Filled 2023-12-01: qty 2

## 2023-12-01 MED ORDER — SUGAMMADEX SODIUM 200 MG/2ML IV SOLN
INTRAVENOUS | Status: DC | PRN
  Administered 2023-12-01: 200 mg via INTRAVENOUS

## 2023-12-01 MED ORDER — ACETAMINOPHEN 500 MG PO TABS
ORAL_TABLET | ORAL | Status: AC
Start: 2023-12-01 — End: 2023-12-01
  Filled 2023-12-01: qty 2

## 2023-12-01 MED ORDER — METHOCARBAMOL 500 MG PO TABS
500.0000 mg | ORAL_TABLET | Freq: Four times a day (QID) | ORAL | Status: DC | PRN
  Administered 2023-12-03 – 2023-12-07 (×12): 500 mg via ORAL
  Filled 2023-12-01 (×16): qty 1

## 2023-12-01 MED ORDER — KETOROLAC TROMETHAMINE 15 MG/ML IJ SOLN
7.5000 mg | Freq: Four times a day (QID) | INTRAMUSCULAR | Status: AC
  Administered 2023-12-01 – 2023-12-02 (×4): 7.5 mg via INTRAVENOUS
  Filled 2023-12-01 (×3): qty 1

## 2023-12-01 MED ORDER — BUPIVACAINE HCL (PF) 0.5 % IJ SOLN
INTRAMUSCULAR | Status: AC
  Filled 2023-12-01: qty 30

## 2023-12-01 MED ORDER — PROPOFOL 500 MG/50ML IV EMUL
INTRAVENOUS | Status: DC | PRN
Start: 2023-12-01 — End: 2023-12-01
  Administered 2023-12-01: 125 ug/kg/min via INTRAVENOUS
  Administered 2023-12-01: 150 mg via INTRAVENOUS

## 2023-12-01 MED ORDER — AMLODIPINE BESYLATE 5 MG PO TABS
5.0000 mg | ORAL_TABLET | Freq: Every day | ORAL | Status: DC
  Administered 2023-12-01 – 2023-12-09 (×9): 5 mg via ORAL
  Filled 2023-12-01 (×9): qty 1

## 2023-12-01 MED ORDER — KETOROLAC TROMETHAMINE 15 MG/ML IJ SOLN
INTRAMUSCULAR | Status: AC
  Filled 2023-12-01: qty 1

## 2023-12-01 MED ORDER — CEFAZOLIN IN SODIUM CHLORIDE 2-0.9 GM/100ML-% IV SOLN
2.0000 g | Freq: Once | INTRAVENOUS | Status: DC
  Filled 2023-12-01: qty 100

## 2023-12-01 MED ORDER — ORAL CARE MOUTH RINSE: 15.0000 mL | Freq: Once | OROMUCOSAL | Status: AC

## 2023-12-01 MED ORDER — DROPERIDOL 2.5 MG/ML IJ SOLN: 0.6250 mg | Freq: Once | INTRAMUSCULAR | Status: DC | PRN

## 2023-12-01 MED ORDER — SURGIFLO WITH THROMBIN (HEMOSTATIC MATRIX KIT) OPTIME
TOPICAL | Status: DC | PRN
  Administered 2023-12-01: 1 via TOPICAL

## 2023-12-01 SURGICAL SUPPLY — 44 items
BASIN KIT SINGLE STR (MISCELLANEOUS) ×1 IMPLANT
BUR NEURO DRILL SOFT 3.0X3.8M (BURR) ×1 IMPLANT
CATH LUMBAR HERMETIC 14G (CATHETERS) IMPLANT
DERMABOND ADVANCED .7 DNX12 (GAUZE/BANDAGES/DRESSINGS) ×1 IMPLANT
DRAPE C ARM PK CFD 31 SPINE (DRAPES) ×1 IMPLANT
DRAPE LAPAROTOMY 100X77 ABD (DRAPES) ×1 IMPLANT
DRAPE MICROSCOPE SPINE 48X150 (DRAPES) ×1 IMPLANT
DRSG OPSITE POSTOP 3X4 (GAUZE/BANDAGES/DRESSINGS) ×1 IMPLANT
DRSG OPSITE POSTOP 4X6 (GAUZE/BANDAGES/DRESSINGS) IMPLANT
DRSG TEGADERM 6X8 (GAUZE/BANDAGES/DRESSINGS) IMPLANT
ELECTRODE EZSTD 165MM 6.5IN (MISCELLANEOUS) ×1 IMPLANT
ELECTRODE REM PT RTRN 9FT ADLT (ELECTROSURGICAL) ×1 IMPLANT
GAUZE 4X4 16PLY ~~LOC~~+RFID DBL (SPONGE) IMPLANT
GLOVE BIOGEL PI IND STRL 6.5 (GLOVE) ×1 IMPLANT
GLOVE SURG SYN 6.5 PF PI (GLOVE) ×1 IMPLANT
GLOVE SURG SYN 8.5 PF PI (GLOVE) ×3 IMPLANT
GOWN SRG LRG LVL 4 IMPRV REINF (GOWNS) ×1 IMPLANT
GOWN SRG XL LVL 3 NONREINFORCE (GOWNS) ×1 IMPLANT
GRAFT DURAGEN MATRIX 1WX1L (Tissue) IMPLANT
HOLDER FOLEY CATH W/STRAP (MISCELLANEOUS) IMPLANT
IV NS 500ML BAXH (IV SOLUTION) IMPLANT
KIT DRAIN CSF ACCUDRAIN (MISCELLANEOUS) IMPLANT
KIT SPINAL PRONEVIEW (KITS) ×1 IMPLANT
MANIFOLD NEPTUNE II (INSTRUMENTS) ×1 IMPLANT
NDL SAFETY ECLIPSE 18X1.5 (NEEDLE) ×1 IMPLANT
NS IRRIG 500ML POUR BTL (IV SOLUTION) ×1 IMPLANT
PACK LAMINECTOMY ARMC (PACKS) ×1 IMPLANT
PAD ARMBOARD POSITIONER FOAM (MISCELLANEOUS) ×1 IMPLANT
PATTIES SURGICAL .5 X.5 (GAUZE/BANDAGES/DRESSINGS) IMPLANT
PATTIES SURGICAL 1X1 (DISPOSABLE) IMPLANT
SPONGE DRAIN TRACH 4X4 STRL 2S (GAUZE/BANDAGES/DRESSINGS) IMPLANT
STRIP CLOSURE SKIN 1/2X4 (GAUZE/BANDAGES/DRESSINGS) IMPLANT
SURGIFLO W/THROMBIN 8M KIT (HEMOSTASIS) ×1 IMPLANT
SUT BONE WAX W31G (SUTURE) IMPLANT
SUT GTX CV-6 30 (SUTURE) IMPLANT
SUT MNCRL AB 3-0 PS2 27 (SUTURE) IMPLANT
SUT STRATA 3-0 15 PS-2 (SUTURE) ×1 IMPLANT
SUT VIC AB 0 CT1 27XCR 8 STRN (SUTURE) ×1 IMPLANT
SUT VIC AB 2-0 CT1 18 (SUTURE) ×1 IMPLANT
SUTURE EHLN 3-0 FS-10 30 BLK (SUTURE) IMPLANT
SYR 30ML LL (SYRINGE) ×2 IMPLANT
SYR 3ML LL SCALE MARK (SYRINGE) ×1 IMPLANT
TRAP FLUID SMOKE EVACUATOR (MISCELLANEOUS) ×1 IMPLANT
TRAY FOLEY SLVR 16FR LF STAT (SET/KITS/TRAYS/PACK) IMPLANT

## 2023-12-01 NOTE — Anesthesia Procedure Notes (Addendum)
 Procedure Name: Intubation Date/Time: 12/01/2023 2:03 PM  Performed by: Norleen Alberta HERO., CRNAPre-anesthesia Checklist: Patient identified, Patient being monitored, Timeout performed, Emergency Drugs available and Suction available Patient Re-evaluated:Patient Re-evaluated prior to induction Oxygen Delivery Method: Circle system utilized Preoxygenation: Pre-oxygenation with 100% oxygen Induction Type: IV induction Ventilation: Mask ventilation without difficulty Laryngoscope Size: 2 and Miller Grade View: Grade I Tube type: Oral Tube size: 7.0 mm Number of attempts: 1 Airway Equipment and Method: Stylet Placement Confirmation: ETT inserted through vocal cords under direct vision, positive ETCO2 and breath sounds checked- equal and bilateral Secured at: 19 cm Tube secured with: Tape Dental Injury: Teeth and Oropharynx as per pre-operative assessment

## 2023-12-01 NOTE — Discharge Instructions (Addendum)
 Your surgeon has performed an operation on your lumbar spine (low back) to relieve pressure on one or more nerves. Many times, patients feel better immediately after surgery and can "overdo it." Even if you feel well, it is important that you follow these activity guidelines. If you do not let your back heal properly from the surgery, you can increase the chance of a disc herniation and/or return of your symptoms. The following are instructions to help in your recovery once you have been discharged from the hospital.  * It is ok to take NSAIDs after surgery. *Regarding compression stockings-  Please wear day and night until you are walking a couple hundred feet three times a day.   Activity    No bending, lifting, or twisting ("BLT"). Avoid lifting objects heavier than 10 pounds (gallon milk jug).  Where possible, avoid household activities that involve lifting, bending, pushing, or pulling such as laundry, vacuuming, grocery shopping, and childcare. Try to arrange for help from friends and family for these activities while your back heals.  Increase physical activity slowly as tolerated.  Taking short walks is encouraged, but avoid strenuous exercise. Do not jog, run, bicycle, lift weights, or participate in any other exercises unless specifically allowed by your doctor. Avoid prolonged sitting, including car rides.  Talk to your doctor before resuming sexual activity.  You should not drive until cleared by your doctor.  Until released by your doctor, you should not return to work or school.  You should rest at home and let your body heal.   You may shower three days after your surgery.  After showering, lightly dab your incision dry. Do not take a tub bath or go swimming for 3 weeks, or until approved by your doctor at your follow-up appointment.  If you smoke, we strongly recommend that you quit.  Smoking has been proven to interfere with normal healing in your back and will dramatically  reduce the success rate of your surgery. Please contact [facility] (800-QUIT-NOW) and use the resources at www.[facility].com for assistance in stopping smoking.  Surgical Incision   If you have a dressing on your incision, you may remove it three days after your surgery. Keep your incision area clean and dry.  If you have staples or stitches on your incision, you should have a follow up scheduled for removal. If you do not have staples or stitches, you will have steri-strips (small pieces of surgical tape) or Dermabond glue. The steri-strips/glue should begin to peel away within about a week (it is fine if the steri-strips fall off before then). If the strips are still in place one week after your surgery, you may gently remove them.  Diet            You may return to your usual diet. Be sure to stay hydrated.  When to Contact Us   Although your surgery and recovery will likely be uneventful, you may have some residual numbness, aches, and pains in your back and/or legs. This is normal and should improve in the next few weeks.  However, should you experience any of the following, contact us  immediately: New numbness or weakness Pain that is progressively getting worse, and is not relieved by your pain medications or rest Bleeding, redness, swelling, pain, or drainage from surgical incision Chills or flu-like symptoms Fever greater than 101.0 F (38.3 C) Problems with bowel or bladder functions Difficulty breathing or shortness of breath Warmth, tenderness, or swelling in your calf  Contact Information How  to contact us :  If you have any questions/concerns before or after surgery, you can reach us  at [phone], or you can send a mychart message. We can be reached by phone or mychart 8am-4pm, Monday-Friday.  *Please note: Calls after 4pm are forwarded to a third party answering service. Mychart messages are not routinely monitored during evenings, weekends, and holidays. Please call our  office to contact the answering service for urgent concerns during non-business hours.     Anal Fissure, Adult  An anal fissure is a small tear or crack in the tissue near the opening of the butt (anus). In most cases, bleeding from the tear or crack stops on its own within a few minutes. You may have bleeding each time you poop until the tear or crack heals. What are the causes? Passing a large or hard poop (stool). Having trouble pooping (constipation). Having watery poops (diarrhea). An inflammatory bowel disease, like Crohn's disease or ulcerative colitis. Childbirth. Infections. Anal sex. What are the signs or symptoms? Bleeding from the butt. Small amounts of blood on your poop. The blood coats the outside of the poop. It is not mixed with the poop. Small amounts of blood on the toilet paper or in the toilet after you poop. Pain when you poop. Itching or irritation around your butt. How is this treated? Treatment may include: Making changes to what you eat and drink. This can help if you have trouble pooping. Taking fiber supplements. These can help make your poop soft. Taking warm water baths (sitz baths). These can help heal the tear. Using creams and ointments. If other treatments do not work, you may need: A shot near the tear or crack (botulinum injection). Surgery to fix the tear or crack. Follow these instructions at home: Medicines Take over-the-counter and prescription medicines only as told by your doctor. This includes creams and ointments that have medicine in them. Use medicines to make your poop soft as told by your doctor. Treating constipation You may need to take these actions to prevent or treat trouble pooping: Drink enough fluid to keep your pee (urine) pale yellow. Eat foods that are high in fiber. These include beans, whole grains, and fresh fruits and vegetables. Stay away from unripe bananas. Ripe bananas are a good choice. Limit foods that are  high in fat and sugar. These include fried or sweet foods. Avoid dairy products. This includes milk.   General instructions  Keep the butt area clean and dry. Take a warm water bath as told by your doctor. Do not use soap. Contact a doctor if: You have more bleeding. You have a fever. You have watery poop that is mixed with blood. Your pain does not go away. Your problems get worse. This information is not intended to replace advice given to you by your health care provider. Make sure you discuss any questions you have with your health care provider. Document Revised: 04/18/2022 Document Reviewed: 04/18/2022 Elsevier Patient Education  2024 [employer].

## 2023-12-01 NOTE — Anesthesia Postprocedure Evaluation (Signed)
 Anesthesia Post Note  Patient: RUMI KOLODZIEJ  Procedure(s) Performed: LUMBAR LAMINECTOMY/DECOMPRESSION MICRODISCECTOMY 4 LEVEL  Patient location during evaluation: PACU Anesthesia Type: General Level of consciousness: lethargic and responds to stimulation Pain management: pain level controlled Vital Signs Assessment: post-procedure vital signs reviewed and stable Respiratory status: spontaneous breathing, nonlabored ventilation, respiratory function stable and patient connected to nasal cannula oxygen Cardiovascular status: blood pressure returned to baseline and stable Postop Assessment: no apparent nausea or vomiting Anesthetic complications: no Comments: Discharge to icu for neuro care   No notable events documented.   Last Vitals:  Vitals:   12/01/23 2315 12/01/23 2330  BP: 105/87 134/71  Pulse: 84 82  Resp: 16 13  Temp:    SpO2: 98% 97%    Last Pain:  Vitals:   12/01/23 2300  TempSrc:   PainSc: 2                  Camellia Merilee Louder

## 2023-12-01 NOTE — Op Note (Signed)
 Indications: [name] is suffering from lumbar stenosis causing neurogenic claudication (ICD10 M48.062). The patient tried and failed conservative management, prompting surgical intervention.  Findings: tenuous dura  Preoperative Diagnosis: Lumbar Stenosis with neurogenic claudication Postoperative Diagnosis: same   EBL: 100 ml IVF:see anesthesia record Drains: 2 Disposition: Extubated and Stable to PACU Complications: none  No foley catheter was placed.   Preoperative Note:  Risks of surgery discussed include: infection, bleeding, stroke, coma, death, paralysis, CSF leak, nerve/spinal cord injury, numbness, tingling, weakness, complex regional pain syndrome, recurrent stenosis and/or disc herniation, vascular injury, development of instability, neck/back pain, need for further surgery, persistent symptoms, development of deformity, and the risks of anesthesia. The patient understood these risks and agreed to proceed.  Operative Note:   1. L2-S1 lumbar decompression including central laminectomy and bilateral medial facetectomies including foraminotomies 2. Placement of lumbar drain  The patient was then brought from the preoperative center with intravenous access established.  The patient underwent general anesthesia and endotracheal tube intubation, and was then rotated on the [place] rail top where all pressure points were appropriately padded.  The skin was then thoroughly cleansed.  Perioperative antibiotic prophylaxis was administered.  Sterile prep and drapes were then applied and a timeout was then observed.  C-arm was brought into the field under sterile conditions and under lateral visualization the L5-S1 and L2-3 interspace was identified and marked.  The incision was marked on the right and injected with local anesthetic. Once this was complete a 8 cm incision was opened with the use of a #10 blade knife.    The metrx tubes were sequentially advanced and confirmed  in position at L5-S1. An 18mm by 70mm tube was locked in place to the bed side attachment.  The microscope was then sterilely brought into the field and muscle creep was hemostased with a bipolar and resected with a pituitary rongeur.  A Bovie extender was then used to expose the spinous process and lamina.  Careful attention was placed to not violate the facet capsule. A 3 mm matchstick drill bit was then used to make a hemi-laminotomy trough until the ligamentum flavum was exposed.  This was extended to the base of the spinous process and to the contralateral side to remove all the central bone from each side.  Once this was complete and the underlying ligamentum flavum was visualized, it was dissected with a curette and resected with Kerrison rongeurs.  Extensive ligamentum hypertrophy was noted, requiring a substantial amount of time and care for removal.  The dura was identified and palpated. The kerrison rongeur was then used to remove the medial facet bilaterally until no compression was noted.  A balltip probe was used to confirm decompression of the ipsilateral S1 nerve root.  Additional attention was paid to completion of the contralateral L5-S1 foraminotomy until the contralateral traversing nerve root was completely free.  Once this was complete, L5-S1 central decompression including medial facetectomy and foraminotomy was confirmed and decompression on both sides was confirmed. No CSF leak was noted.   After performing the decompression at L5-S1, the metrx tubes were sequentially advanced and confirmed in position at L4-5. An 18mm by 60mm tube was locked in place to the bed side attachment.  Fluoroscopy was then removed from the field.  The microscope was then sterilely brought into the field and muscle creep was hemostased with a bipolar and resected with a pituitary rongeur.  A Bovie extender was then used to expose the spinous process and lamina.  Careful attention was placed to not violate the  facet capsule. A 3 mm matchstick drill bit was then used to make a hemi-laminotomy trough until the ligamentum flavum was exposed.  This was extended to the base of the spinous process and to the contralateral side to remove all the central bone from each side.  Once this was complete and the underlying ligamentum flavum was visualized, it was dissected with a curette and resected with Kerrison rongeurs.  Extensive ligamentum hypertrophy was noted, requiring a substantial amount of time and care for removal.  The dura was identified and palpated. The kerrison rongeur was then used to remove the medial facet bilaterally until no compression was noted.  A balltip probe was used to confirm decompression of the ipsilateral L5 nerve root.  Additional attention was paid to completion of the contralateral foraminotomy until the contralateral L5 nerve root was completely free.  Once this was complete, L4-5 central decompression including medial facetectomy and foraminotomy was confirmed and decompression on both sides was confirmed.   During decompression, there was a linear durotomy on the dorsolateral aspect of the dura near the shoulder of the nerve.  This is carefully protected.  Due to extrusion of the nerve rootlets, the decision was made to open to allow for reapproximation of the dural defect.  A McCullough retractor was placed.  Additional bone was removed to allow for complete visualization of the dura.  Using Gore-Tex suture, the dura was reapproximated and all the nerve rootlets were placed back within the thecal sac.  No obvious spinal fluid leakage was noted.  DuraGen and Vistaseal  were used to complement the closure.   The wound was copiously irrigated. The tube system was then removed under microscopic visualization and hemostasis was obtained with a bipolar.     After performing the decompression at L4-5, the metrx tubes were sequentially advanced and confirmed in position at L3-4. An 18mm by 60mm  tube was locked in place to the bed side attachment.  Fluoroscopy was then removed from the field.  The microscope was then sterilely brought into the field and muscle creep was hemostased with a bipolar and resected with a pituitary rongeur.  A Bovie extender was then used to expose the spinous process and lamina.  Careful attention was placed to not violate the facet capsule. A 3 mm matchstick drill bit was then used to make a hemi-laminotomy trough until the ligamentum flavum was exposed.  This was extended to the base of the spinous process and to the contralateral side to remove all the central bone from each side.  Once this was complete and the underlying ligamentum flavum was visualized, it was dissected with a curette and resected with Kerrison rongeurs.  Extensive ligamentum hypertrophy was noted, requiring a substantial amount of time and care for removal.  The dura was identified and palpated. The kerrison rongeur was then used to remove the medial facet bilaterally until no compression was noted.  A balltip probe was used to confirm decompression of the ipsilateral L4 nerve root.  Additional attention was paid to completion of the contralateral foraminotomy until the contralateral L4 nerve root was completely free.  Once this was complete, L3-4 central decompression including medial facetectomy and foraminotomy was confirmed and decompression on both sides was confirmed.   A small durotomy was noted.  This was closed with Gore-Tex suture in similar fashion as described above.  DuraGen and Vistaseal  were used to augment the closure.   The wound was copiously  irrigated. The tube system was then removed under microscopic visualization and hemostasis was obtained with a bipolar.    After performing the decompression at L3-4, the metrx tubes were sequentially advanced and confirmed in position at L2-3. An 18mm by 60mm tube was locked in place to the bed side attachment.  Fluoroscopy was then removed  from the field.  The microscope was then sterilely brought into the field and muscle creep was hemostased with a bipolar and resected with a pituitary rongeur.  A Bovie extender was then used to expose the spinous process and lamina.  Careful attention was placed to not violate the facet capsule. A 3 mm matchstick drill bit was then used to make a hemi-laminotomy trough until the ligamentum flavum was exposed.  This was extended to the base of the spinous process and to the contralateral side to remove all the central bone from each side.  Once this was complete and the underlying ligamentum flavum was visualized, it was dissected with a curette and resected with Kerrison rongeurs.  Extensive ligamentum hypertrophy was noted, requiring a substantial amount of time and care for removal.  The dura was identified and palpated. The kerrison rongeur was then used to remove the medial facet bilaterally until no compression was noted.  A balltip probe was used to confirm decompression of the ipsilateral L3 nerve root.  Additional attention was paid to completion of the contralateral foraminotomy until the contralateral L3 nerve root was completely free.  Once this was complete, L2-3 central decompression including medial facetectomy and foraminotomy was confirmed and decompression on both sides was confirmed. No CSF leak was noted.  The wound was copiously irrigated. The tube system was then removed under microscopic visualization and hemostasis was obtained with a bipolar.    A subfascial drain was placed.  This was not placed on suction.  The fascial layer was reapproximated with the use of a 0 Vicryl suture.  Subcutaneous tissue layer was reapproximated using 2-0 Vicryl suture.  3-0 nylon was used on the skin.  Due to the extent of her spinal fluid leak and durotomy, the decision was made to place a lumbar drain.  The MRI was reevaluated and the conus was noted to end L1-2.  The L2-3 interspace was chosen for  placement of the lumbar drain.  Using a midline trajectory, the intrathecal space was accessed with a 14-gauge Touhy needle.  The catheter was advanced to a depth of 15 cm.  Spinal fluid flow was noted.  This is secured at the skin.  It was connected to the collection chamber without any obvious complications.   Patient was then rotated back to the preoperative bed awakened from anesthesia and taken to recovery all counts are correct in this case.  I performed the entire procedure with the assistance of Edsel Goods PA as an Designer, television/film set. An assistant was required for this procedure due to the complexity.  The assistant provided assistance in tissue manipulation and suction, and was required for the successful and safe performance of the procedure. I performed the critical portions of the procedure.   Zade Falkner K. Clois MD

## 2023-12-01 NOTE — Interval H&P Note (Signed)
 History and Physical Interval Note:  12/01/2023 1:25 PM  Sierra Sparks  has presented today for surgery, with the diagnosis of M48.062 Neurogenic claudication due to lumbar spinal stenosis.  The various methods of treatment have been discussed with the patient and family. After consideration of risks, benefits and other options for treatment, the patient has consented to  Procedure(s) with comments: LUMBAR LAMINECTOMY/DECOMPRESSION MICRODISCECTOMY 4 LEVEL (N/A) - L2-S1 POSTERIOR SPINAL DECOMPRESSION as a surgical intervention.  The patient's history has been reviewed, patient examined, no change in status, stable for surgery.  I have reviewed the patient's chart and labs.  Questions were answered to the patient's satisfaction.    Heart sounds normal no MRG. Chest Clear to Auscultation Bilaterally.   [name]

## 2023-12-01 NOTE — Transfer of Care (Signed)
 Immediate Anesthesia Transfer of Care Note  Patient: Sierra Sparks  Procedure(s) Performed: LUMBAR LAMINECTOMY/DECOMPRESSION MICRODISCECTOMY 4 LEVEL  Patient Location: PACU  Anesthesia Type:General  Level of Consciousness: drowsy  Airway & Oxygen Therapy: Patient Spontanous Breathing and Patient connected to face mask oxygen  Post-op Assessment: Report given to RN and Post -op Vital signs reviewed and stable  Post vital signs: Reviewed and stable  Last Vitals:  Vitals Value Taken Time  BP 164/116 12/01/23 19:58  Temp    Pulse 64 12/01/23 20:00  Resp 22 12/01/23 20:00  SpO2 100 % 12/01/23 20:00  Vitals shown include unfiled device data.  Last Pain:  Vitals:   12/01/23 1238  TempSrc:   PainSc: 8          Complications: No notable events documented.

## 2023-12-01 NOTE — Progress Notes (Addendum)
 eLink Physician-Brief Progress Note Patient Name: DARIONA POSTMA DOB: Jun 22, 1940 MRN: [id number]   Date of Service  12/01/2023  HPI/Events of Note  82/F, arrives to the ICU post operatively. Pt had been admitted for lumbar stenosis causing neurogenic claudication. Pt underwent L2-S1 lumbar decompression and placement of lumbar drain. Pt was subsequently admitted to the ICU for close monitoring and further management.   eICU Interventions  - Admit to CIU  - Will maintain on continuous cardiac monitoring - Monitor I/Os, output per lumbar drain.  - Pain control - Encourage incentive spirometry - BP control - Will monitor serial labs, will address abnormalities as warranted.         Tanara Turvey M DELA CRUZ 12/01/2023, 10:31 PM

## 2023-12-02 ENCOUNTER — Encounter: Payer: Self-pay | Admitting: Neurosurgery

## 2023-12-02 MED ORDER — MELATONIN 5 MG PO TABS
2.5000 mg | ORAL_TABLET | Freq: Every day | ORAL | Status: DC
  Administered 2023-12-02 – 2023-12-08 (×7): 2.5 mg via ORAL
  Filled 2023-12-02 (×7): qty 1

## 2023-12-02 NOTE — Plan of Care (Signed)
  Problem: Education: Goal: Knowledge of General Education information will improve Description: Including pain rating scale, medication(s)/side effects and non-pharmacologic comfort measures Outcome: Progressing   Problem: Health Behavior/Discharge Planning: Goal: Ability to manage health-related needs will improve Outcome: Progressing   Problem: Clinical Measurements: Goal: Ability to maintain clinical measurements within normal limits will improve Outcome: Progressing Goal: Will remain free from infection Outcome: Progressing Goal: Diagnostic test results will improve Outcome: Progressing Goal: Respiratory complications will improve Outcome: Progressing Goal: Cardiovascular complication will be avoided Outcome: Progressing   Problem: Activity: Goal: Risk for activity intolerance will decrease Outcome: Progressing   Problem: Nutrition: Goal: Adequate nutrition will be maintained Outcome: Progressing   Problem: Elimination: Goal: Will not experience complications related to bowel motility Outcome: Progressing Goal: Will not experience complications related to urinary retention Outcome: Progressing   Problem: Pain Managment: Goal: General experience of comfort will improve and/or be controlled Outcome: Progressing   Problem: Safety: Goal: Ability to remain free from injury will improve Outcome: Progressing   Problem: Skin Integrity: Goal: Risk for impaired skin integrity will decrease Outcome: Progressing   Problem: Education: Goal: Ability to verbalize activity precautions or restrictions will improve Outcome: Progressing Goal: Knowledge of the prescribed therapeutic regimen will improve Outcome: Progressing Goal: Understanding of discharge needs will improve Outcome: Progressing   Problem: Activity: Goal: Ability to avoid complications of mobility impairment will improve Outcome: Progressing Goal: Ability to tolerate increased activity will  improve Outcome: Progressing Goal: Will remain free from falls Outcome: Progressing   Problem: Clinical Measurements: Goal: Ability to maintain clinical measurements within normal limits will improve Outcome: Progressing Goal: Postoperative complications will be avoided or minimized Outcome: Progressing Goal: Diagnostic test results will improve Outcome: Progressing   Problem: Pain Management: Goal: Pain level will decrease Outcome: Progressing   Problem: Skin Integrity: Goal: Will show signs of wound healing Outcome: Progressing   Problem: Health Behavior/Discharge Planning: Goal: Identification of resources available to assist in meeting health care needs will improve Outcome: Progressing

## 2023-12-02 NOTE — TOC Initial Note (Addendum)
 Transition of Care [facility]) - Initial/Assessment Note    Patient Details  Name: Sierra Sparks MRN: [id number] Date of Birth: 12/08/1940  [facility] CM/SW Contact:    Corrie JINNY Ruts, LCSW Phone Number: 12/02/2023, 2:02 PM  Clinical Narrative:                 Chart reviewed. The patient was admitted for Status Post Lumbar Spine operation. I spoke with the patient, the patient husband, and sister at bedside today. I introduced myself, my role, reason for consult, and HIPAA. The patient was ok with continuing the consult. The patient reports that she has a primary care doctor. The patient reports that she lives in the home with her husband. The patient reports that she could do her daily living task on her own. The patient reports that her husband transport her to her medical appointments and that her family will help during discharge. The patient reports that she uses that [facility] on [address] For her pharmacy and copays are affordable. The patient confirms that she has never has HH or been in a SNF before. The patient reports having a cane, walker, and wheel chair in the home. The patient reports no questions or concerns.    Patient denied needing any medication assistance during the assessment.  TOC will follow up with the patient about provider recommendations.     Barriers to Discharge: Continued Medical Work up   Patient Goals and CMS Choice            Expected Discharge Plan and Services       Living arrangements for the past 2 months: Apartment                                      Prior Living Arrangements/Services Living arrangements for the past 2 months: Apartment Lives with:: Spouse Patient language and need for interpreter reviewed:: Yes        Need for Family Participation in Patient Care: Yes (Comment)     Criminal Activity/Legal Involvement Pertinent to Current Situation/Hospitalization: No - Comment as needed  Activities of Daily  Living   ADL Screening (condition at time of admission) Independently performs ADLs?: Yes (appropriate for developmental age) Is the patient deaf or have difficulty hearing?: No Does the patient have difficulty seeing, even when wearing glasses/contacts?: No Does the patient have difficulty concentrating, remembering, or making decisions?: No  Permission Sought/Granted                  Emotional Assessment Appearance:: Appears stated age Attitude/Demeanor/Rapport: Engaged, Gracious Affect (typically observed): Calm, Appropriate, Pleasant Orientation: : Oriented to Self, Oriented to Place, Oriented to  Time, Oriented to Situation Alcohol / Substance Use: Not Applicable Psych Involvement: No (comment)  Admission diagnosis:  Neurogenic claudication due to lumbar spinal stenosis [F51.937] Status post lumbar spine operation [Z98.890] Patient Active Problem List   Diagnosis Date Noted   Status post lumbar spine operation 12/01/2023   Lumbar stenosis with neurogenic claudication 12/01/2023   CSF leak 12/01/2023   Pre-operative clearance 11/24/2023   Falls frequently 06/12/2023   Peripheral vertigo 12/17/2022   Prediabetes 05/23/2022   Irregular heart beat 06/22/2021   Colon cancer screening 05/18/2021   Encounter for screening mammogram for breast cancer 03/31/2017   Hearing loss in right ear 08/30/2016   Anal fissure 03/06/2016   Internal hemorrhoids 02/26/2016   Routine general  medical examination at a health care facility 02/03/2015   Estrogen deficiency 02/03/2015   Encounter for Medicare annual wellness exam 08/12/2012   Glaucoma 08/12/2012   History of colon polyps 05/15/2011   Anemia 05/15/2011   Allergic rhinitis 03/02/2008   Osteoporosis 01/05/2007   HYPERCHOLESTEROLEMIA 10/24/2006   Depression with anxiety 10/24/2006   Essential hypertension 10/24/2006   HEMORRHOIDS 10/24/2006   FIBROCYSTIC BREAST DISEASE 10/24/2006   OSTEOARTHRITIS 10/24/2006   PCP:  Randeen Laine LABOR, MD Pharmacy:   [facility] [address] [place] - [address] [place] [address] Phone: [phone] Fax: [phone]     Social Drivers of Health (SDOH) Social History: SDOH Screenings   Food Insecurity: No Food Insecurity (12/01/2023)  Housing: Low Risk  (12/01/2023)  Transportation Needs: No Transportation Needs (12/01/2023)  Utilities: Not At Risk (12/01/2023)  Alcohol Screen: Low Risk  (05/30/2023)  Depression (PHQ2-9): Low Risk  (11/24/2023)  Financial Resource Strain: Low Risk  (05/30/2023)  Physical Activity: Inactive (05/30/2023)  Social Connections: Socially Integrated (12/01/2023)  Stress: No Stress Concern Present (05/30/2023)  Tobacco Use: Low Risk  (12/01/2023)  Health Literacy: Adequate Health Literacy (05/30/2023)   SDOH Interventions:     Readmission Risk Interventions     No data to display

## 2023-12-02 NOTE — Progress Notes (Signed)
   Neurosurgery Progress Note  History: AMEN DARGIS is s/p L2-S1 decompression  POD1:  PT in ICU for lumbar drain management   Physical Exam: Vitals:   12/02/23 0648 12/02/23 0700  BP: (!) 156/88 139/71  Pulse: 86 87  Resp: 13 17  Temp:    SpO2: 96% 96%    AA Ox3 CNI  Strength:5/5 throughout BLE  Incision: covered with serosanguineous saturated dressing.  Data:  Other tests/results: see results review  Assessment/Plan:  LINDE WILENSKY is an 83 y.o presenting with lumbar stenosis s/p L2-S1 decompression complicated by a CSF leak resulting in lumbar drain placement  - Pt on flat bed rest until 8/20. Ok to sit up to 15 degrees for meals only. OK for patient to roll from side to side as needed and sleep/ rest on side - continue to drain lumbar drain at 10/ hr  -Dressing was removed and incision was monitored for several minutes.  There is no obvious drainage from her incision.  A clean honeycomb was placed over the incision.  Please continue to monitor. - pain control - DVT prophylaxis - PTOT; will consult after bed rest lifted  Edsel Goods PA-C Department of Neurosurgery

## 2023-12-02 NOTE — Plan of Care (Signed)
  Problem: Clinical Measurements: Goal: Diagnostic test results will improve Outcome: Progressing   Problem: Pain Managment: Goal: General experience of comfort will improve and/or be controlled Outcome: Progressing   Problem: Safety: Goal: Ability to remain free from injury will improve Outcome: Progressing   Problem: Skin Integrity: Goal: Risk for impaired skin integrity will decrease Outcome: Progressing

## 2023-12-03 LAB — GLUCOSE, CAPILLARY: Glucose-Capillary: 113 mg/dL — ABNORMAL HIGH (ref 70–99)

## 2023-12-03 NOTE — Care Management Important Message (Signed)
 Important Message  Patient Details  Name: Sierra Sparks MRN: [id number] Date of Birth: [birth year]   Important Message Given:  Yes - Medicare IM     Rojelio SHAUNNA Rattler 12/03/2023, 12:28 PM

## 2023-12-03 NOTE — Plan of Care (Signed)
  Problem: Clinical Measurements: Goal: Diagnostic test results will improve Outcome: Progressing   Problem: Nutrition: Goal: Adequate nutrition will be maintained Outcome: Progressing   Problem: Elimination: Goal: Will not experience complications related to bowel motility Outcome: Progressing Goal: Will not experience complications related to urinary retention Outcome: Progressing   Problem: Pain Managment: Goal: General experience of comfort will improve and/or be controlled Outcome: Progressing   Problem: Education: Goal: Ability to verbalize activity precautions or restrictions will improve Outcome: Progressing

## 2023-12-03 NOTE — Progress Notes (Signed)
   Neurosurgery Progress Note  History: Sierra Sparks is s/p L2-S1 decompression  POD2: Pt doing well this morning with minimal complaints  POD1:  PT in ICU for lumbar drain management   Physical Exam: Vitals:   12/03/23 0700 12/03/23 0800  BP: (!) 147/82 138/84  Pulse: 78 79  Resp: 16 16  Temp:  98 F (36.7 C)  SpO2: 96% 97%    AA Ox3 CNI  Strength:5/5 throughout BLE  Incision: new dressing with quarter size amount of dried blood HV 10 overnight   Data:  Other tests/results: see results review  Assessment/Plan:  Sierra Sparks is an 83 y.o presenting with lumbar stenosis s/p L2-S1 decompression complicated by a CSF leak resulting in lumbar drain placement  - Pt on flat bed rest until 8/20. Ok to sit up to 15 degrees for meals only. OK for patient to roll from side to side as needed and sleep/ rest on side - continue to drain lumbar drain at 10/ hr  - Please continue to monitor dressing - pain control - DVT prophylaxis - PTOT; will consult after bed rest lifted - continue foley while on bed rest  Edsel Goods PA-C Department of Neurosurgery

## 2023-12-03 NOTE — Plan of Care (Signed)
  Problem: Clinical Measurements: Goal: Ability to maintain clinical measurements within normal limits will improve Outcome: Progressing Goal: Will remain free from infection Outcome: Progressing Goal: Diagnostic test results will improve Outcome: Progressing Goal: Respiratory complications will improve Outcome: Progressing Goal: Cardiovascular complication will be avoided Outcome: Progressing   Problem: Elimination: Goal: Will not experience complications related to bowel motility Outcome: Not Progressing Goal: Will not experience complications related to urinary retention Outcome: Progressing   Problem: Pain Managment: Goal: General experience of comfort will improve and/or be controlled Outcome: Not Progressing   Problem: Safety: Goal: Ability to remain free from injury will improve Outcome: Progressing

## 2023-12-03 NOTE — Progress Notes (Incomplete)
 Report received from day shift RN. Pt resting in bed in no acute distress. Pt A&O x4, denying pain at this time. Discussed importance of staying ahead of pain before it gets too bad. Pt voiced understanding. Log roll to right side to assess dressing. No new drainage noted, previous drainage to honeycomb marked. Per report from day shift RN - only to notify provider if no drainage from drain as it is known that pt is taking up to an hour to drain and at times not draining full 10mL. Pt noted to be pulling herself onto her side. Advised pt that she should be mindful of how she repositions herself to prevent straining her back and/or dislodging lumbar drain and advised that nursing staff is available to help with repositioning as needed. Pt voiced understanding. Pain medications administered per orders throughout the night (see EMAR). Pts husband remained at bedside throughout the night. Bed alarm on and call bell within reach.   Lumbar drain open @ 1909.  Lumbar drain closed & emptied @ 1959 (5mL out). Lumbar drain open @ 2000. Lumbar drain closed & emptied @ 2057 (10mL out). Lumbar drain open @ 2100 Lumbar drain closed & emptied @ 2129 (10mL out). Lumbar drain open @ 2200.  Lumbar drain closed & emptied @ 2227 (10mL out). Lumbar drain open @ 2300.  Lumbar drain closed & emptied @ 2359 (7mL out).  Lumbar drain open @ 0000.  Lumbar drain closed & emptied @ 0059 (5mL out). Lumbar drain open @ 0100.  Lumbar drain closed & emptied @ 0159 (5mL out).  Lumbar drain open @ 0200.  Lumbar drain closed & emptied @ 0259 (5mL out). Lumbar drain open @ 0300.  Lumbar drain closed & emptied @ 0359 (6mL out).  Lumbar drain open @ 0400.  Lumbar drain closed & emptied @ 0459 (8mL out). Lumbar drain open @ 0500.  Lumbar drain closed & emptied @ 0550 (10mL out).  Lumbar drain open @ 0600.  Lumbar drain closed & emptied @ 0644 (10mL out).

## 2023-12-03 NOTE — Progress Notes (Addendum)
 RN notified Edsel Goods PA pt's lumbar drain took 50 min to drain 10 ml this morning.   Lumbar drain open at 0700.  Lumbar drain empty at 0750. (10ml) Lumbar drain open at 0800. Lumbar drain empty at 0859. (10ml) Lumbar drain open at 0900. Lumbar drain empty at 0957. (10ml) Lumbar drain open at 1000 Lumbar drain empty at 1043 (10 ml) Lumbar drain open at 1100. Lumbar drain empty at 1159 (9ml) Danielle PA notified.  Lumbar drain open at 1200. Lumbar drain empty at 1256 (10ml) Lumbar drain open at 1300. Lumbar drain empty at 1359 (5ml) Danielle PA notified Lumbar drain open at 1400. Lumbar drain empty at 1459  (9ml) Lumbar drain open at 1500. Lumbar drain empty at 1543. (10 ml) Lumbar drain open at 1600. Lumbar drain empty at 1634 (10ml) Lumbar drain open at 1700. Lumbar drain empty at 1759 (8 ml) Lumbar drain open at 1800 Lumbar drain empty at 1847 (10 ml)

## 2023-12-04 MED ORDER — HYDRALAZINE HCL 20 MG/ML IJ SOLN
5.0000 mg | INTRAMUSCULAR | Status: DC | PRN
  Administered 2023-12-04 – 2023-12-05 (×4): 5 mg via INTRAVENOUS
  Filled 2023-12-04 (×4): qty 1

## 2023-12-04 MED ORDER — BUTALBITAL-APAP-CAFFEINE 50-325-40 MG PO TABS
1.0000 | ORAL_TABLET | ORAL | Status: DC | PRN
  Filled 2023-12-04 (×2): qty 1

## 2023-12-04 MED ORDER — ACETAMINOPHEN 500 MG PO TABS
500.0000 mg | ORAL_TABLET | Freq: Four times a day (QID) | ORAL | Status: DC
  Administered 2023-12-04 – 2023-12-09 (×19): 500 mg via ORAL
  Filled 2023-12-04 (×19): qty 1

## 2023-12-04 NOTE — Progress Notes (Addendum)
  Inpatient Rehabilitation Admissions Coordinator   I contacted patient's son, Krystal, at her bedside by phone. We discussed goals and expectations of a possible CIR admit. I recommended that he, Patient and his Dad begin discussions of rehab venue preference and follow up with me tomorrow so that I can assist with that choice between CIR level rehab vs SNF. Noted pre op notes that patient and spouse had stated preference for [facility]. Please call me with any questions.   Heron Leavell, RN, MSN Rehab Admissions Coordinator [phone]

## 2023-12-04 NOTE — Progress Notes (Signed)
 RN notified Edsel Goods PA, PT got patient up from the bed to the chair. After patient being in the chair for couple mins she started to complaint of a headache and look very pale. PT called a RN for assistance, patient was assisted back to the bed. once patient was in the bed she stated headache was gone. RN assess lumbar drain dressing and there were not signs of fluid leakage.

## 2023-12-04 NOTE — Progress Notes (Signed)
  Inpatient Rehab Admissions Coordinator :  Per therapy recommendations, patient was screened for CIR candidacy by Ottie Glazier RN MSN.  At this time patient appears to be a potential candidate for CIR. I will place a rehab consult per protocol for full assessment. Please call me with any questions.  Ottie Glazier RN MSN Admissions Coordinator [phone]

## 2023-12-04 NOTE — Evaluation (Signed)
 Physical Therapy Evaluation Patient Details Name: Sierra Sparks MRN: [id number] DOB: 1941-02-20 Today's Date: 12/04/2023  History of Present Illness  Pt admitted for L2-S1 decompression with CSF complication resulting in lumbar drain placement on 12/01/23. Pt is POD 3 at time of evaluation. Cleared for participation.  Clinical Impression  Pt is a pleasant 83 year old female who was admitted for L2-S1 decompression. Pt performs bed mobility with mod assist +2 and transfers with min assist +2 and RW. Pt demonstrates deficits with strength/mobility/balance. Once in recliner, pt demonstrated pre syncopal episode- RN notified and requested pt to be returned to bed. Of note, pt also complained of headache-resolved once supine. Would benefit from skilled PT to address above deficits and promote optimal return to PLOF. Pt will continue to receive skilled PT services while admitted and will defer to TOC/care team for updates regarding disposition planning.   Supine BP: 166/80 Once in chair, pt with pale/sweaty/confusion- reassessed BP: 131/99 Back supine: 171/82      If plan is discharge home, recommend the following: Two people to help with walking and/or transfers;A lot of help with bathing/dressing/bathroom;Assist for transportation;Help with stairs or ramp for entrance   Can travel by private vehicle        Equipment Recommendations BSC/3in1  Recommendations for Other Services       Functional Status Assessment Patient has had a recent decline in their functional status and demonstrates the ability to make significant improvements in function in a reasonable and predictable amount of time.     Precautions / Restrictions Precautions Precautions: Back;Fall Precaution Booklet Issued: No Recall of Precautions/Restrictions: Intact Precaution/Restrictions Comments: lumbar drain, no brace required Restrictions Weight Bearing Restrictions Per Provider Order: No      Mobility  Bed  Mobility Overal bed mobility: Needs Assistance Bed Mobility: Supine to Sit, Sit to Supine     Supine to sit: Mod assist, +2 for physical assistance Sit to supine: Mod assist, +2 for physical assistance   General bed mobility comments: cues for log rolling and physical assist for transition to EOB. Once seated, able to sit for extended time, no dizziness noted. Does report hamstring cramps in B LEs once seated.    Transfers Overall transfer level: Needs assistance Equipment used: Rolling walker (2 wheels) Transfers: Bed to chair/wheelchair/BSC     Step pivot transfers: Min assist, +2 physical assistance       General transfer comment: step by step cues for sequencing from bed>chair with need for physical assist for RW management. Needed mod assist for return back to bed.    Ambulation/Gait               General Gait Details: not able to perform this date due to complications getting up to chair  Stairs            Wheelchair Mobility     Tilt Bed    Modified Rankin (Stroke Patients Only)       Balance Overall balance assessment: Needs assistance Sitting-balance support: Feet supported Sitting balance-Leahy Scale: Good     Standing balance support: Bilateral upper extremity supported Standing balance-Leahy Scale: Fair                               Pertinent Vitals/Pain Pain Assessment Pain Assessment: 0-10 Pain Score: 10-Worst pain ever Pain Location: low back, also complained on 1/10 with headache Pain Descriptors / Indicators: Operative site guarding Pain Intervention(s): Limited activity  within patient's tolerance, Premedicated before session, Repositioned    Home Living Family/patient expects to be discharged to:: Private residence Living Arrangements: Spouse/significant other Available Help at Discharge: Family;Available 24 hours/day Type of Home: House Home Access: Level entry       Home Layout: One level Home Equipment:  Agricultural consultant (2 wheels);Rollator (4 wheels);Cane - single point;Wheelchair - manual      Prior Function Prior Level of Function : Independent/Modified Independent;Driving             Mobility Comments: uses SPC at baseline, no falls ADLs Comments: husband typically drives. Indep with all ADLs     Extremity/Trunk Assessment   Upper Extremity Assessment Upper Extremity Assessment: Generalized weakness    Lower Extremity Assessment Lower Extremity Assessment: Generalized weakness       Communication   Communication Communication: No apparent difficulties    Cognition Arousal: Alert Behavior During Therapy: WFL for tasks assessed/performed   PT - Cognitive impairments: No apparent impairments                       PT - Cognition Comments: did become slightly disoriented once OOB Following commands: Intact       Cueing Cueing Techniques: Verbal cues     General Comments General comments (skin integrity, edema, etc.): vitals assessed with mobility and position changes    Exercises     Assessment/Plan    PT Assessment Patient needs continued PT services  PT Problem List Decreased strength;Decreased activity tolerance;Decreased balance;Decreased mobility;Decreased knowledge of use of DME;Decreased knowledge of precautions;Pain       PT Treatment Interventions DME instruction;Gait training;Therapeutic exercise;Balance training    PT Goals (Current goals can be found in the Care Plan section)  Acute Rehab PT Goals Patient Stated Goal: to get stronger PT Goal Formulation: With patient Time For Goal Achievement: 12/18/23 Potential to Achieve Goals: Good    Frequency 7X/week     Co-evaluation               AM-PAC PT 6 Clicks Mobility  Outcome Measure Help needed turning from your back to your side while in a flat bed without using bedrails?: A Little Help needed moving from lying on your back to sitting on the side of a flat bed without  using bedrails?: A Little Help needed moving to and from a bed to a chair (including a wheelchair)?: A Lot Help needed standing up from a chair using your arms (e.g., wheelchair or bedside chair)?: A Little Help needed to walk in hospital room?: A Lot Help needed climbing 3-5 steps with a railing? : Total 6 Click Score: 14    End of Session   Activity Tolerance: Treatment limited secondary to medical complications (Comment) Patient left: in bed;with nursing/sitter in room;with SCD's reapplied Nurse Communication: Mobility status PT Visit Diagnosis: Muscle weakness (generalized) (M62.81);Difficulty in walking, not elsewhere classified (R26.2);Pain Pain - Right/Left:  (central) Pain - part of body:  (back)    Time: 8981-8948 PT Time Calculation (min) (ACUTE ONLY): 33 min   Charges:   PT Evaluation $PT Eval Moderate Complexity: 1 Mod PT Treatments $Therapeutic Activity: 8-22 mins PT General Charges $$ ACUTE PT VISIT: 1 Visit         Corean Dade, PT, DPT, GCS [phone]   Eshika Reckart 12/04/2023, 11:41 AM

## 2023-12-04 NOTE — Evaluation (Signed)
 Occupational Therapy Evaluation Patient Details Name: Sierra Sparks MRN: [id number] DOB: [birth year] Today's Date: 12/04/2023   History of Present Illness   Pt admitted for L2-S1 decompression with CSF complication resulting in lumbar drain placement on 12/01/23. Pt is POD 3 at time of evaluation. Cleared for participation.     Clinical Impressions Patient presenting with decreased Ind in self care,balance, functional mobility/transfers, endurance, and safety awareness. Patient reports living at home with family and being Ind at home. Pt very anxious about therapy session and needing increased instructions about plan for session. Pt needing mod A for bed mobility. Stand from EOB with mod A and pt taking several steps to the L side with mod to max A and use of RW to recliner chair. Patient will benefit from acute OT to increase overall independence in the areas of ADLs, functional mobility, and safety awareness in order to safely discharge.  BP in supine: 150/69 After steps to recliner chair: 100/74 After sitting for 5 minutes: 118/82     If plan is discharge home, recommend the following:   A lot of help with walking and/or transfers;A lot of help with bathing/dressing/bathroom;Assistance with cooking/housework;Direct supervision/assist for medications management;Direct supervision/assist for financial management;Assist for transportation;Help with stairs or ramp for entrance     Functional Status Assessment   Patient has had a recent decline in their functional status and demonstrates the ability to make significant improvements in function in a reasonable and predictable amount of time.     Equipment Recommendations   Other (comment) (defer to next venue of care)      Precautions/Restrictions   Precautions Precautions: Back;Fall Precaution Booklet Issued: No Precaution/Restrictions Comments: lumbar drain, no brace required     Mobility Bed Mobility Overal bed  mobility: Needs Assistance Bed Mobility: Supine to Sit     Supine to sit: Mod assist          Transfers Overall transfer level: Needs assistance Equipment used: Rolling walker (2 wheels) Transfers: Bed to chair/wheelchair/BSC, Sit to/from Stand Sit to Stand: Mod assist     Step pivot transfers: Mod assist, Max assist            Balance Overall balance assessment: Needs assistance Sitting-balance support: Feet supported Sitting balance-Leahy Scale: Good     Standing balance support: Bilateral upper extremity supported Standing balance-Leahy Scale: Fair                             ADL either performed or assessed with clinical judgement   ADL Overall ADL's : Needs assistance/impaired                         Toilet Transfer: Maximal assistance;Ambulation;Rolling walker (2 wheels) Toilet Transfer Details (indicate cue type and reason): simulated                 Vision Patient Visual Report: No change from baseline              Pertinent Vitals/Pain Pain Assessment Pain Assessment: 0-10 Pain Score: 3  Pain Location: back Pain Descriptors / Indicators: Operative site guarding, Discomfort Pain Intervention(s): Limited activity within patient's tolerance, Premedicated before session, Monitored during session, Repositioned     Extremity/Trunk Assessment Upper Extremity Assessment Upper Extremity Assessment: Generalized weakness   Lower Extremity Assessment Lower Extremity Assessment: Generalized weakness       Communication Communication Communication: No apparent difficulties   Cognition Arousal:  Alert Behavior During Therapy: WFL for tasks assessed/performed, Anxious               OT - Cognition Comments: slow processing                 Following commands: Intact       Cueing  General Comments   Cueing Techniques: Verbal cues              Home Living Family/patient expects to be discharged to::  Private residence Living Arrangements: Spouse/significant other Available Help at Discharge: Family;Available 24 hours/day Type of Home: House Home Access: Level entry     Home Layout: One level     Bathroom Shower/Tub: Walk-in shower         Home Equipment: Agricultural consultant (2 wheels);Rollator (4 wheels);Cane - single point;Wheelchair - manual          Prior Functioning/Environment Prior Level of Function : Independent/Modified Independent;Driving             Mobility Comments: uses SPC at baseline, no falls ADLs Comments: husband typically drives. Indep with all ADLs    OT Problem List: Decreased strength;Impaired balance (sitting and/or standing);Decreased knowledge of precautions;Pain;Decreased safety awareness;Cardiopulmonary status limiting activity;Decreased activity tolerance;Decreased knowledge of use of DME or AE   OT Treatment/Interventions: Self-care/ADL training;Manual therapy;Therapeutic exercise;Patient/family education;Balance training;Energy conservation;Therapeutic activities;DME and/or AE instruction      OT Goals(Current goals can be found in the care plan section)   Acute Rehab OT Goals Patient Stated Goal: to get stronger and go to rehab OT Goal Formulation: With patient Time For Goal Achievement: 12/18/23 Potential to Achieve Goals: Fair ADL Goals Pt Will Perform Grooming: with supervision;standing Pt Will Perform Lower Body Dressing: with contact guard assist;sit to/from stand Pt Will Transfer to Toilet: with contact guard assist;bedside commode Pt Will Perform Toileting - Clothing Manipulation and hygiene: with contact guard assist;sit to/from stand   OT Frequency:  Min 3X/week       AM-PAC OT 6 Clicks Daily Activity     Outcome Measure Help from another person eating meals?: None Help from another person taking care of personal grooming?: None Help from another person toileting, which includes using toliet, bedpan, or urinal?: A  Lot Help from another person bathing (including washing, rinsing, drying)?: A Lot Help from another person to put on and taking off regular upper body clothing?: A Lot Help from another person to put on and taking off regular lower body clothing?: Total 6 Click Score: 15   End of Session Equipment Utilized During Treatment: Rolling walker (2 wheels);Other (comment) (lumbar drain) Nurse Communication: Mobility status  Activity Tolerance: Patient tolerated treatment well Patient left: in bed;with call bell/phone within reach;with bed alarm set  OT Visit Diagnosis: Unsteadiness on feet (R26.81);Muscle weakness (generalized) (M62.81);Pain Pain - part of body:  (back)                Time: 8547-8471 OT Time Calculation (min): 36 min Charges:  OT General Charges $OT Visit: 1 Visit OT Evaluation $OT Eval High Complexity: 1 High OT Treatments $Therapeutic Activity: 23-37 mins  Izetta Claude, MS, OTR/L , CBIS ascom [phone]  12/04/23, 3:51 PM

## 2023-12-04 NOTE — Progress Notes (Signed)
   Neurosurgery Progress Note  History: Sierra Sparks is s/p L2-S1 decompression  POD3: Pt doing well this morning. Reports better pain controll  POD2: Pt doing well this morning with minimal complaints  POD1:  PT in ICU for lumbar drain management   Physical Exam: Vitals:   12/04/23 0600 12/04/23 0700  BP: (!) 169/102 (!) 146/86  Pulse: 87 83  Resp: 11 (!) 24  Temp:    SpO2: 97% 97%    AA Ox3 CNI  Strength:5/5 throughout BLE  Incision: Dressing is stable without new drainage.   Data:  Other tests/results: see results review  Assessment/Plan:  Sierra Sparks is an 83 y.o presenting with lumbar stenosis s/p L2-S1 decompression complicated by a CSF leak resulting in lumbar drain placement  - Will remove flat bed rest order.  - clamp lumbar drain - will continue foley for now  - Please continue to monitor dressing - pain control - DVT prophylaxis - PTOT; ok to work with therapy today  Edsel Goods PA-C Department of Neurosurgery

## 2023-12-05 NOTE — Progress Notes (Signed)
 Physical Therapy Treatment Patient Details Name: Sierra Sparks MRN: [id number] DOB: 1940/12/02 Today's Date: 12/05/2023   History of Present Illness Pt admitted for L2-S1 decompression with CSF complication resulting in lumbar drain placement on 12/01/23. Pt is POD 3 at time of evaluation. Cleared for participation.    PT Comments  Pt demonstrates improvement with activity tolerance and was able to ambulate 10ft using RW. Min A at the beginning of ambulation, however with fatigue pt required mod A for technique. Pt continues to have pain with movement throughout session. Vitals monitored throughout session and stayed WNL. Would benefit from skilled PT to address above deficits and promote optimal return to PLOF.     If plan is discharge home, recommend the following: Two people to help with walking and/or transfers;A lot of help with bathing/dressing/bathroom;Assist for transportation;Help with stairs or ramp for entrance   Can travel by private vehicle        Equipment Recommendations  BSC/3in1    Recommendations for Other Services       Precautions / Restrictions Precautions Precautions: Back;Fall Recall of Precautions/Restrictions: Intact Precaution/Restrictions Comments: lumbar drain, no brace required Restrictions Weight Bearing Restrictions Per Provider Order: No     Mobility  Bed Mobility Overal bed mobility: Needs Assistance             General bed mobility comments: NT. Pt received in recliner pre/post session.    Transfers Overall transfer level: Needs assistance Equipment used: Rolling walker (2 wheels) Transfers: Bed to chair/wheelchair/BSC, Sit to/from Stand Sit to Stand: Mod assist Stand pivot transfers: Mod assist         General transfer comment: Verbal and tactile cuing for posture upon standing and hand placement.    Ambulation/Gait Ambulation/Gait assistance: Mod assist, +2 safety/equipment Gait Distance (Feet): 25 Feet Assistive  device: Rolling walker (2 wheels) Gait Pattern/deviations: Step-to pattern, Decreased step length - right, Decreased step length - left Gait velocity: decreased     General Gait Details: Pt able to ambulate with min-mod A from bed to right outside her room with 1 seated rest break. Verbal cues for RW management and upright posture. Pt has tendency to have forward flexed posture especially with fatigue and d/t pain.   Stairs             Wheelchair Mobility     Tilt Bed    Modified Rankin (Stroke Patients Only)       Balance Overall balance assessment: Needs assistance Sitting-balance support: Feet supported Sitting balance-Leahy Scale: Good Sitting balance - Comments: Able to maintain seated posture without back support in recliner. Pt required verbal cuing to scoot hips back into the recliner.   Standing balance support: Bilateral upper extremity supported Standing balance-Leahy Scale: Fair Standing balance comment: Heavy reliance on RW for balance. Forward flexed posture with fatigue and d/t pain.                            Communication Communication Communication: No apparent difficulties  Cognition Arousal: Alert Behavior During Therapy: WFL for tasks assessed/performed, Anxious   PT - Cognitive impairments: No apparent impairments                       PT - Cognition Comments: Pt is pleasant and agreeable to PT. Following commands: Intact      Cueing Cueing Techniques: Verbal cues, Tactile cues  Exercises      General Comments  Pertinent Vitals/Pain Pain Assessment Pain Assessment: 0-10 Pain Score: 7  Pain Location: back Pain Descriptors / Indicators: Operative site guarding, Discomfort Pain Intervention(s): Monitored during session, Limited activity within patient's tolerance    Home Living                          Prior Function            PT Goals (current goals can now be found in the care plan  section) Acute Rehab PT Goals Patient Stated Goal: None stated PT Goal Formulation: With patient Time For Goal Achievement: 12/18/23 Potential to Achieve Goals: Good Progress towards PT goals: Progressing toward goals    Frequency    7X/week      PT Plan      Co-evaluation              AM-PAC PT 6 Clicks Mobility   Outcome Measure  Help needed turning from your back to your side while in a flat bed without using bedrails?: A Little Help needed moving from lying on your back to sitting on the side of a flat bed without using bedrails?: A Little Help needed moving to and from a bed to a chair (including a wheelchair)?: A Lot Help needed standing up from a chair using your arms (e.g., wheelchair or bedside chair)?: A Little Help needed to walk in hospital room?: A Lot Help needed climbing 3-5 steps with a railing? : Total 6 Click Score: 14    End of Session   Activity Tolerance: Patient tolerated treatment well;Patient limited by pain Patient left: in chair;with call bell/phone within reach;with family/visitor present Nurse Communication: Mobility status PT Visit Diagnosis: Muscle weakness (generalized) (M62.81);Difficulty in walking, not elsewhere classified (R26.2);Pain Pain - part of body:  (back)     Time: 9057-9042 PT Time Calculation (min) (ACUTE ONLY): 15 min  Charges:                            Reace Breshears, SPT   Nissim Fleischer 12/05/2023, 10:24 AM

## 2023-12-05 NOTE — Progress Notes (Signed)
   Inpatient Rehabilitation Admissions Coordinator   I received call from Patient's spouse. He and his wife prefer rehab at [facility] in [place]. They had expressed this preoperatively . I willa alert acute team and TOC. We will sign off.  Heron Leavell, RN, MSN Rehab Admissions Coordinator [phone] 12/05/2023 10:36 AM

## 2023-12-05 NOTE — NC FL2 (Signed)
 [place]  MEDICAID FL2 LEVEL OF CARE FORM     IDENTIFICATION  Patient Name: Sierra Sparks Birthdate: [birth year] Sex: female Admission Date (Current Location): 12/01/2023  [place] and [place] Number:  Chiropodist and Address:  [facility], [address], [place], [place] [address]      Provider Number: [id number]  Attending Physician Name and Address:  Clois Fret, MD  Relative Name and Phone Number:  Chanice Brenton, [phone]    Current Level of Care: Hospital Recommended Level of Care: Skilled Nursing Facility Prior Approval Number:    Date Approved/Denied:   PASRR Number: [id number]  Discharge Plan: SNF    Current Diagnoses: Patient Active Problem List   Diagnosis Date Noted   Status post lumbar spine operation 12/01/2023   Lumbar stenosis with neurogenic claudication 12/01/2023   CSF leak 12/01/2023   Pre-operative clearance 11/24/2023   Falls frequently 06/12/2023   Peripheral vertigo 12/17/2022   Prediabetes 05/23/2022   Irregular heart beat 06/22/2021   Colon cancer screening 05/18/2021   Encounter for screening mammogram for breast cancer 03/31/2017   Hearing loss in right ear 08/30/2016   Anal fissure 03/06/2016   Internal hemorrhoids 02/26/2016   Routine general medical examination at a health care facility 02/03/2015   Estrogen deficiency 02/03/2015   Encounter for Medicare annual wellness exam 08/12/2012   Glaucoma 08/12/2012   History of colon polyps 05/15/2011   Anemia 05/15/2011   Allergic rhinitis 03/02/2008   Osteoporosis 01/05/2007   HYPERCHOLESTEROLEMIA 10/24/2006   Depression with anxiety 10/24/2006   Essential hypertension 10/24/2006   HEMORRHOIDS 10/24/2006   FIBROCYSTIC BREAST DISEASE 10/24/2006   OSTEOARTHRITIS 10/24/2006    Orientation RESPIRATION BLADDER Height & Weight     Self, Time, Place, Situation  Normal Continent Weight: 178 lb 5.6 oz (80.9 kg) Height:  5' 1 (154.9 cm)   BEHAVIORAL SYMPTOMS/MOOD NEUROLOGICAL BOWEL NUTRITION STATUS   (None)   Continent Diet (regular)  AMBULATORY STATUS COMMUNICATION OF NEEDS Skin   Limited Assist Verbally Surgical wounds                       Personal Care Assistance Level of Assistance  Bathing, Feeding, Dressing Bathing Assistance: Limited assistance Feeding assistance: Limited assistance Dressing Assistance: Limited assistance     Functional Limitations Info  Sight, Hearing, Speech Sight Info: Adequate Hearing Info: Adequate Speech Info: Adequate    SPECIAL CARE FACTORS FREQUENCY  OT (By licensed OT), PT (By licensed PT)     PT Frequency: 5x a week OT Frequency: 5x a week            Contractures Contractures Info: Not present    Additional Factors Info  Code Status, Allergies Code Status Info: full Allergies Info: None           Current Medications (12/05/2023):  This is the current hospital active medication list Current Facility-Administered Medications  Medication Dose Route Frequency Provider Last Rate Last Admin   acetaminophen  (TYLENOL ) suppository 650 mg  650 mg Rectal Q4H PRN Gregory Edsel Ruth, PA       acetaminophen  (TYLENOL ) tablet 500 mg  500 mg Oral Q6H Clois Fret, MD   500 mg at 12/05/23 1046   amLODipine  (NORVASC ) tablet 5 mg  5 mg Oral Daily Gregory Edsel Ruth, PA   5 mg at 12/05/23 1047   butalbital -acetaminophen -caffeine  (FIORICET ) 50-325-40 MG per tablet 1 tablet  1 tablet Oral Q4H PRN Gregory Edsel Ruth, PA  Chlorhexidine  Gluconate Cloth 2 % PADS 6 each  6 each Topical Daily Clois Fret, MD   6 each at 12/05/23 1015   diazepam  (VALIUM ) tablet 2 mg  2 mg Oral Q8H PRN Gregory Edsel Ruth, PA       docusate sodium  (COLACE) capsule 100 mg  100 mg Oral BID Gregory Edsel Ruth, PA   100 mg at 12/05/23 1046   enoxaparin  (LOVENOX ) injection 40 mg  40 mg Subcutaneous Q24H Gregory Edsel Ruth, [place]   40 mg at 12/05/23 0750   ezetimibe  (ZETIA ) tablet 10 mg  10 mg  Oral Daily Koch, Danielle Lee, PA   10 mg at 12/04/23 2157   FLUoxetine  (PROZAC ) capsule 20 mg  20 mg Oral Daily Gregory Edsel Ruth, PA   20 mg at 12/05/23 1047   fluticasone  (FLONASE ) 50 MCG/ACT nasal spray 2 spray  2 spray Each Nare Daily PRN Gregory Edsel Ruth, PA       hydrALAZINE  (APRESOLINE ) injection 5 mg  5 mg Intravenous Q4H PRN Gregory Edsel Ruth, PA   5 mg at 12/05/23 0804   hydrocortisone  (ANUSOL -HC) suppository 25 mg  25 mg Rectal Daily PRN Gregory Edsel Ruth, PA       magnesium  citrate solution 1 Bottle  1 Bottle Oral Once PRN Gregory Edsel Ruth, PA       melatonin tablet 2.5 mg  2.5 mg Oral QHS Clois Fret, MD   2.5 mg at 12/04/23 2155   menthol -cetylpyridinium (CEPACOL) lozenge 3 mg  1 lozenge Oral PRN Gregory Edsel Ruth, PA       Or   phenol (CHLORASEPTIC) mouth spray 1 spray  1 spray Mouth/Throat PRN Gregory Edsel Ruth, PA       methocarbamol  (ROBAXIN ) tablet 500 mg  500 mg Oral Q6H PRN Koch, Danielle Lee, PA   500 mg at 12/05/23 1045   Or   methocarbamol  (ROBAXIN ) injection 500 mg  500 mg Intravenous Q6H PRN Gregory Edsel Ruth, PA       ondansetron  (ZOFRAN ) tablet 4 mg  4 mg Oral Q6H PRN Gregory Edsel Ruth, PA       Or   ondansetron  (ZOFRAN ) injection 4 mg  4 mg Intravenous Q6H PRN Gregory Edsel Ruth, PA       oxyCODONE  (Oxy IR/ROXICODONE ) immediate release tablet 10 mg  10 mg Oral Q4H PRN Koch, Danielle Lee, PA   10 mg at 12/05/23 9197   oxyCODONE  (Oxy IR/ROXICODONE ) immediate release tablet 5 mg  5 mg Oral Q4H PRN Koch, Danielle Lee, PA   5 mg at 12/03/23 0313   polyethylene glycol (MIRALAX  / GLYCOLAX ) packet 17 g  17 g Oral Daily PRN Gregory Edsel Ruth, PA   17 g at 12/04/23 2157   rosuvastatin  (CRESTOR ) tablet 5 mg  5 mg Oral Daily Gregory Edsel Ruth, PA   5 mg at 12/04/23 2157   senna (SENOKOT) tablet 8.6 mg  1 tablet Oral BID Gregory Edsel Ruth, PA   8.6 mg at 12/05/23 1047   sodium chloride  flush (NS) 0.9 % injection 3 mL  3 mL Intravenous Q12H Gregory Edsel Ruth, PA   3 mL at 12/05/23 9248   sodium chloride  flush (NS) 0.9 % injection 3 mL  3 mL Intravenous PRN Gregory Edsel Ruth, PA       sorbitol  70 % solution 30 mL  30 mL Oral Daily PRN Gregory Edsel Ruth, PA         Discharge Medications: Please see discharge summary for a  list of discharge medications.  Relevant Imaging Results:  Relevant Lab Results:   Additional Information SSN [id number]  Corrie JINNY Ruts, LCSW

## 2023-12-05 NOTE — Progress Notes (Addendum)
 0800 Patient constantly moans. When ask about pain she stated she did not hurt. Husband fluttering about trying to help wife. 0802 Given prn for pain of 5 and before patient is OOB. Encouraged to eat and drink. 0900 OOPB with minimal help and lots of encouragement.

## 2023-12-05 NOTE — Progress Notes (Signed)
   Neurosurgery Progress Note  History: SHEYANN SULTON is s/p L2-S1 decompression  POD4: Doing well. No HA.  POD3: Pt doing well this morning. Reports better pain controll  POD2: Pt doing well this morning with minimal complaints  POD1:  PT in ICU for lumbar drain management   Physical Exam: Vitals:   12/05/23 0600 12/05/23 0700  BP: (!) 152/83 (!) 160/91  Pulse: 87 89  Resp: 18 17  Temp:    SpO2: 95% 95%    AA Ox3 CNI  Strength:5/5 throughout BLE  Incision: Dressing is stable without new drainage.   Data:  Other tests/results: see results review  Assessment/Plan:  JALEEAH SLIGHT is an 83 y.o presenting with lumbar stenosis s/p L2-S1 decompression complicated by a CSF leak resulting in lumbar drain placement  - remove lumbar drain - Please continue to monitor dressing - pain control - DVT prophylaxis - PTOT  Reeves Daisy MD Department of Neurosurgery

## 2023-12-05 NOTE — Progress Notes (Signed)
 Occupational Therapy Treatment Patient Details Name: Sierra Sparks MRN: [id number] DOB: [birth year] Today's Date: 12/05/2023   History of present illness Pt admitted for L2-S1 decompression with CSF complication resulting in lumbar drain placement on 12/01/23. Pt is POD 3 at time of evaluation. Cleared for participation.   OT comments  Upon entering the room, pt supine in bed but agreeable to OT intervention. Pt performing supine>sit with mod A for log roll to EOB. Pt reports 6-7/10 low back pain and medication given during session. Pt stands with mod A and ambulates 15' with RW to sink to brush teeth with min A - mod A. Pt needing mod cuing to remain upright and knees buckle while standing at sink. Pt ambulating back to bed with mod A and returning back to bed secondary to fatigue and increased concern for B knee buckle. Pt repositioned for comfort. Call bell and all needed items within reach. RN present to give medications.       If plan is discharge home, recommend the following:  A lot of help with walking and/or transfers;A lot of help with bathing/dressing/bathroom;Assistance with cooking/housework;Direct supervision/assist for medications management;Direct supervision/assist for financial management;Assist for transportation;Help with stairs or ramp for entrance   Equipment Recommendations  Other (comment) (defer to next venue of care)       Precautions / Restrictions Precautions Precautions: Back;Fall Precaution Booklet Issued: No Recall of Precautions/Restrictions: Intact Restrictions Weight Bearing Restrictions Per Provider Order: No       Mobility Bed Mobility Overal bed mobility: Needs Assistance Bed Mobility: Supine to Sit, Sit to Supine     Supine to sit: Mod assist Sit to supine: Mod assist        Transfers Overall transfer level: Needs assistance Equipment used: Rolling walker (2 wheels) Transfers: Sit to/from Stand, Bed to chair/wheelchair/BSC Sit to  Stand: Mod assist                 Balance Overall balance assessment: Needs assistance Sitting-balance support: Feet supported Sitting balance-Leahy Scale: Good                                     ADL either performed or assessed with clinical judgement   ADL Overall ADL's : Needs assistance/impaired     Grooming: Oral care;Minimal assistance;Standing                                      Extremity/Trunk Assessment Upper Extremity Assessment Upper Extremity Assessment: Generalized weakness   Lower Extremity Assessment Lower Extremity Assessment: Generalized weakness        Vision Patient Visual Report: No change from baseline           Communication Communication Communication: No apparent difficulties   Cognition Arousal: Alert Behavior During Therapy: WFL for tasks assessed/performed, Anxious                                 Following commands: Intact        Cueing   Cueing Techniques: Verbal cues, Tactile cues             Pertinent Vitals/ Pain       Pain Assessment Pain Assessment: 0-10 Pain Score: 6  Pain Location: back Pain Descriptors / Indicators: Operative site guarding,  Discomfort Pain Intervention(s): Monitored during session, Limited activity within patient's tolerance         Frequency  Min 2X/week        Progress Toward Goals  OT Goals(current goals can now be found in the care plan section)  Progress towards OT goals: Progressing toward goals      AM-PAC OT 6 Clicks Daily Activity     Outcome Measure   Help from another person eating meals?: None Help from another person taking care of personal grooming?: A Little Help from another person toileting, which includes using toliet, bedpan, or urinal?: A Lot Help from another person bathing (including washing, rinsing, drying)?: A Lot Help from another person to put on and taking off regular upper body clothing?: A Lot Help  from another person to put on and taking off regular lower body clothing?: A Lot 6 Click Score: 15    End of Session Equipment Utilized During Treatment: Rolling walker (2 wheels)  OT Visit Diagnosis: Unsteadiness on feet (R26.81);Muscle weakness (generalized) (M62.81);Pain   Activity Tolerance Patient limited by fatigue;Patient limited by pain   Patient Left with call bell/phone within reach;with bed alarm set;in bed   Nurse Communication Mobility status;Patient requests pain meds        Time: 8589-8571 OT Time Calculation (min): 18 min  Charges: OT General Charges $OT Visit: 1 Visit OT Treatments $Self Care/Home Management : 8-22 mins  Izetta Claude, MS, OTR/L , CBIS ascom [phone]  12/05/23, 3:59 PM

## 2023-12-06 ENCOUNTER — Inpatient Hospital Stay

## 2023-12-06 LAB — CBC
HCT: 34.8 % — ABNORMAL LOW (ref 36.0–46.0)
Hemoglobin: 11.7 g/dL — ABNORMAL LOW (ref 12.0–15.0)
MCH: 31.8 pg (ref 26.0–34.0)
MCHC: 33.6 g/dL (ref 30.0–36.0)
MCV: 94.6 fL (ref 80.0–100.0)
Platelets: 265 K/uL (ref 150–400)
RBC: 3.68 MIL/uL — ABNORMAL LOW (ref 3.87–5.11)
RDW: 13.2 % (ref 11.5–15.5)
WBC: 6.8 K/uL (ref 4.0–10.5)
nRBC: 0 % (ref 0.0–0.2)

## 2023-12-06 LAB — TYPE AND SCREEN
ABO/RH(D): O POS
Antibody Screen: NEGATIVE

## 2023-12-06 MED ORDER — IOHEXOL 350 MG/ML SOLN
75.0000 mL | Freq: Once | INTRAVENOUS | Status: AC | PRN
  Administered 2023-12-06: 100 mL via INTRAVENOUS

## 2023-12-06 MED ORDER — SENNOSIDES-DOCUSATE SODIUM 8.6-50 MG PO TABS
1.0000 | ORAL_TABLET | Freq: Two times a day (BID) | ORAL | Status: DC
  Administered 2023-12-06 – 2023-12-08 (×4): 1 via ORAL
  Filled 2023-12-06 (×5): qty 1

## 2023-12-06 MED ORDER — CELECOXIB 200 MG PO CAPS
200.0000 mg | ORAL_CAPSULE | Freq: Two times a day (BID) | ORAL | Status: DC
  Administered 2023-12-06: 200 mg via ORAL
  Filled 2023-12-06: qty 1

## 2023-12-06 MED ORDER — SMOG ENEMA
960.0000 mL | Freq: Every day | RECTAL | Status: DC | PRN
  Administered 2023-12-07: 960 mL via RECTAL
  Filled 2023-12-06 (×2): qty 960

## 2023-12-06 NOTE — Progress Notes (Signed)
   Neurosurgery Progress Note  History: Sierra Sparks is s/p L2-S1 decompression  POD5: Having some back pain but otherwise well.  POD4: Doing well. No HA.  POD3: Pt doing well this morning. Reports better pain controll  POD2: Pt doing well this morning with minimal complaints  POD1:  PT in ICU for lumbar drain management   Physical Exam: Vitals:   12/06/23 0500 12/06/23 0600  BP: 117/73 126/82  Pulse: 85 84  Resp:  16  Temp:    SpO2: 99% 97%    AA Ox3 CNI  Strength:5/5 throughout BLE  Incision: c/d/I  Data:  Other tests/results: see results review  Assessment/Plan:  Sierra Sparks is an 83 y.o presenting with lumbar stenosis s/p L2-S1 decompression complicated by a CSF leak resulting in lumbar drain placement. Drain is now removed.   - pain control - DVT prophylaxis - PTOT - Dispo planning  Reeves Daisy MD Department of Neurosurgery

## 2023-12-06 NOTE — Progress Notes (Signed)
 Physical Therapy Treatment Patient Details Name: Sierra Sparks MRN: [id number] DOB: 1941/04/06 Today's Date: 12/06/2023   History of Present Illness Pt admitted for L2-S1 decompression with CSF complication resulting in lumbar drain placement on 12/01/23. Pt is POD 3 at time of evaluation. Cleared for participation.    PT Comments  Pt received supine in bed agreeable to PT. Reports pain feeling improved. Reliant on re-education and mod multimodal cuing and support for supine to side lying for log roll and for side lying to sit. Able to stand CGA to light minA for STS to RW but has poor hand placement despite education and VC's. Able to ambulate CGA to bathroom. Indep for pericare and standing to RW from toilet rise. Pt does report bright red blood in toilet bowl. States genitalia pain with urinating, didn't pass BM so doesn't think it came from her rectum. Denies SOB, weakness, or other acute signs/symptoms out of the ordinary she has experienced since her admission. Ambulates CGA back to recliner with minA and VC's for RW placement and LE placement inside RW. Pt left in recliner with all needs in reach. NSG updated on findings. Encouraged pt to hit call bell with any new symptoms. D/c recs remain appropriate.     If plan is discharge home, recommend the following: A little help with walking and/or transfers;A little help with bathing/dressing/bathroom;Assistance with cooking/housework;Assist for transportation;Help with stairs or ramp for entrance   Can travel by private vehicle     Yes  Equipment Recommendations  BSC/3in1    Recommendations for Other Services       Precautions / Restrictions       Mobility  Bed Mobility Overal bed mobility: Needs Assistance Bed Mobility: Supine to Sit     Supine to sit: Min assist, HOB elevated, Used rails     General bed mobility comments: mod multi modal cuing for log roll technique to L side of bed. Use of RLE and UE to assist in supine to  side lying. Patient Response: Cooperative  Transfers Overall transfer level: Needs assistance Equipment used: Rolling walker (2 wheels) Transfers: Sit to/from Stand Sit to Stand: Contact guard assist, Min assist           General transfer comment: VC's for hand placement with STS with poor compliance. Stands using BUE's on RW for stability.    Ambulation/Gait Ambulation/Gait assistance: Min assist, Contact guard assist Gait Distance (Feet): 40 Feet Assistive device: Rolling walker (2 wheels) Gait Pattern/deviations: Step-to pattern, Decreased step length - right, Decreased step length - left       General Gait Details: min VC's for RW proximity, keeping LE's inside RW BOS. Seated break in bathroom for toileting prior to ambulation around room to recliner.   Stairs             Wheelchair Mobility     Tilt Bed Tilt Bed Patient Response: Cooperative  Modified Rankin (Stroke Patients Only)       Balance Overall balance assessment: Needs assistance Sitting-balance support: Feet supported, Bilateral upper extremity supported Sitting balance-Leahy Scale: Good     Standing balance support: Bilateral upper extremity supported Standing balance-Leahy Scale: Poor Standing balance comment: heavy BUE support needed at RW to stand.                            Communication Communication Communication: No apparent difficulties  Cognition Arousal: Alert Behavior During Therapy: WFL for tasks assessed/performed, Anxious  PT - Cognitive impairments: No apparent impairments                       PT - Cognition Comments: Pt is pleasant and agreeable to PT. Following commands: Intact      Cueing Cueing Techniques: Verbal cues, Tactile cues  Exercises Other Exercises Other Exercises: reviewed log roll technique    General Comments General comments (skin integrity, edema, etc.): Found to have bright red blood in toilet after urination. Had  genitalia pain with urination.      Pertinent Vitals/Pain Pain Assessment Pain Assessment: Faces Faces Pain Scale: Hurts little more Pain Location: back Pain Descriptors / Indicators: Operative site guarding, Discomfort Pain Intervention(s): Limited activity within patient's tolerance, Monitored during session, Repositioned    Home Living                          Prior Function            PT Goals (current goals can now be found in the care plan section) Acute Rehab PT Goals Patient Stated Goal: None stated PT Goal Formulation: With patient Time For Goal Achievement: 12/18/23 Potential to Achieve Goals: Good Progress towards PT goals: Progressing toward goals    Frequency    7X/week      PT Plan      Co-evaluation              AM-PAC PT 6 Clicks Mobility   Outcome Measure  Help needed turning from your back to your side while in a flat bed without using bedrails?: A Lot Help needed moving from lying on your back to sitting on the side of a flat bed without using bedrails?: A Lot Help needed moving to and from a bed to a chair (including a wheelchair)?: A Little Help needed standing up from a chair using your arms (e.g., wheelchair or bedside chair)?: A Little Help needed to walk in hospital room?: A Little Help needed climbing 3-5 steps with a railing? : Total 6 Click Score: 14    End of Session Equipment Utilized During Treatment: Gait belt Activity Tolerance: Patient tolerated treatment well Patient left: in chair;with call bell/phone within reach;with family/visitor present;with chair alarm set Nurse Communication: Mobility status PT Visit Diagnosis: Muscle weakness (generalized) (M62.81);Difficulty in walking, not elsewhere classified (R26.2);Pain Pain - Right/Left:  (lumbar midline)     Time: 8841-8783 PT Time Calculation (min) (ACUTE ONLY): 18 min  Charges:    $Therapeutic Activity: 8-22 mins PT General Charges $$ ACUTE PT  VISIT: 1 Visit                     Dorina HERO. Fairly IV, PT, DPT Physical Therapist- [place]  [facility] 12/06/2023, 12:37 PM

## 2023-12-06 NOTE — Plan of Care (Signed)
  Problem: Education: Goal: Knowledge of General Education information will improve Description: Including pain rating scale, medication(s)/side effects and non-pharmacologic comfort measures Outcome: Progressing   Problem: Clinical Measurements: Goal: Will remain free from infection Outcome: Progressing Goal: Respiratory complications will improve Outcome: Progressing Goal: Cardiovascular complication will be avoided Outcome: Progressing   Problem: Nutrition: Goal: Adequate nutrition will be maintained Outcome: Progressing   Problem: Coping: Goal: Level of anxiety will decrease Outcome: Progressing   Problem: Pain Managment: Goal: General experience of comfort will improve and/or be controlled Outcome: Progressing   Problem: Safety: Goal: Ability to remain free from injury will improve Outcome: Progressing   Problem: Education: Goal: Understanding of discharge needs will improve Outcome: Progressing   Problem: Activity: Goal: Ability to avoid complications of mobility impairment will improve Outcome: Progressing Goal: Ability to tolerate increased activity will improve Outcome: Progressing   Problem: Pain Management: Goal: Pain level will decrease Outcome: Progressing   Problem: Skin Integrity: Goal: Will show signs of wound healing Outcome: Progressing   Problem: Health Behavior/Discharge Planning: Goal: Identification of resources available to assist in meeting health care needs will improve Outcome: Progressing   Problem: Bladder/Genitourinary: Goal: Urinary functional status for postoperative course will improve Outcome: Progressing

## 2023-12-07 DIAGNOSIS — K602 Anal fissure, unspecified: Secondary | ICD-10-CM | POA: Diagnosis not present

## 2023-12-07 MED ORDER — NITROGLYCERIN 2 % TD OINT
0.5000 [in_us] | TOPICAL_OINTMENT | Freq: Two times a day (BID) | TRANSDERMAL | Status: DC
  Administered 2023-12-07 – 2023-12-09 (×2): 0.5 [in_us] via TOPICAL
  Filled 2023-12-07: qty 1

## 2023-12-07 NOTE — Plan of Care (Signed)

## 2023-12-07 NOTE — Consult Note (Signed)
 Patient ID: Sierra Sparks, female   DOB: 1940/10/12, 83 y.o.   MRN: [id number]  HPI Sierra Sparks is a 83 y.o. female at the request of Dr. Katrina for rectal bleeding.  On 8/18 she underwent L2-S1 lumbar decompression including central laminectomy and bilateral medial facetectomies including foraminotomies.  She has had issues with pain.  Did have a CFS leak has resolved. Yesterday reports having bloody output when she went to the commode.  She also reports rectal pain that has happened for the last day or so.  Prior history of hemorrhoids.  She underwent a CTA that have personally reviewed showing no evidence of active bleeding. She also has a type III large paraesophageal hernia, Sh does endorse reflux but no dysphagia, She has known of this hernia for a while.  She has on a stable hemoglobin HemoDynamics are appropriate  HPI  Past Medical History:  Diagnosis Date   Allergic rhinitis, cause unspecified    Allergy    Anemia    Anxiety state, unspecified    Arthritis    Cataract    forming    Colon polyps    Depressive disorder, not elsewhere classified    Diffuse cystic mastopathy    Disorder of bone and cartilage, unspecified    Diverticulosis    External hemorrhoids    Gallstones    GERD (gastroesophageal reflux disease)    Glaucoma    narrow angle thatwas corrected with laser surgery    Osteoarthrosis, unspecified whether generalized or localized, unspecified site    hands   Osteoporosis    Pre-diabetes    Pure hypercholesterolemia    Thyroid  disease    years past- no meds currently    Unspecified essential hypertension    Unspecified hemorrhoids without mention of complication     Past Surgical History:  Procedure Laterality Date   BREAST BIOPSY Left 08/11/2020   11:00 3cmfn Q clip u/s bx papilloma   BREAST EXCISIONAL BIOPSY Left 08/25/2020   surgical exc papilloma with tag placement tag no.[id number]   BREAST LUMPECTOMY WITH RADIO FREQUENCY LOCALIZER Left  08/25/2020   Procedure: BREAST LUMPECTOMY WITH RADIO FREQUENCY LOCALIZER;  Surgeon: Tye Millet, DO;  Location: [facility] ORS;  Service: General;  Laterality: Left;   BUNIONECTOMY Left 10/1999   CESAREAN SECTION  1981   CHOLECYSTECTOMY     COLONOSCOPY  01/2002   polyp; hemorrhoids;diverticulosis   COLONOSCOPY  2013   diverticulosis   DEXA  06/2000   osteopenia   DEXA  04/2003   Stable   HAND SURGERY Left    DIP fusion-left   HEMORRHOID SURGERY     LUMBAR LAMINECTOMY/DECOMPRESSION MICRODISCECTOMY N/A 12/01/2023   Procedure: LUMBAR LAMINECTOMY/DECOMPRESSION MICRODISCECTOMY 4 LEVEL;  Surgeon: Clois Fret, MD;  Location: [facility] ORS;  Service: Neurosurgery;  Laterality: N/A;  L2-S1 POSTERIOR SPINAL DECOMPRESSION   POLYPECTOMY     TONSILLECTOMY     childhood    Family History  Problem Relation Age of Onset   Stroke Mother    Coronary artery disease Mother 52   Hypertension Mother    Hyperlipidemia Mother    Arthritis Father    Lung cancer Father        + smoker   Hypertension Sister    Migraines Sister    Colon cancer Brother        ? primary colon   Liver cancer Brother    Cancer Brother        type unknown   Stroke Maternal Grandmother  hemorrhagic   Cancer Maternal Grandmother        female cancer type unknown   Bone cancer Paternal Uncle    Esophageal cancer Neg Hx    Rectal cancer Neg Hx    Stomach cancer Neg Hx    Breast cancer Neg Hx    Colon polyps Neg Hx     Social History Social History   Tobacco Use   Smoking status: Never   Smokeless tobacco: Never  Vaping Use   Vaping status: Never Used  Substance Use Topics   Alcohol use: Yes    Alcohol/week: 3.0 standard drinks of alcohol    Types: 3 Glasses of wine per week    Comment: Occasional-wine, 3 per week   Drug use: No    No Known Allergies  Current Facility-Administered Medications  Medication Dose Route Frequency Provider Last Rate Last Admin   acetaminophen  (TYLENOL ) suppository 650 mg   650 mg Rectal Q4H PRN Gregory Edsel Ruth, PA       acetaminophen  (TYLENOL ) tablet 500 mg  500 mg Oral Q6H Clois Fret, MD   500 mg at 12/06/23 2108   amLODipine  (NORVASC ) tablet 5 mg  5 mg Oral Daily Gregory Edsel Ruth, PA   5 mg at 12/06/23 9143   butalbital -acetaminophen -caffeine  (FIORICET ) 50-325-40 MG per tablet 1 tablet  1 tablet Oral Q4H PRN Gregory Edsel Ruth, PA       diazepam  (VALIUM ) tablet 2 mg  2 mg Oral Q8H PRN Gregory Edsel Ruth, PA       docusate sodium  (COLACE) capsule 100 mg  100 mg Oral BID Gregory Edsel Ruth, PA   100 mg at 12/06/23 2109   ezetimibe  (ZETIA ) tablet 10 mg  10 mg Oral Daily Koch, Danielle Lee, PA   10 mg at 12/06/23 2110   FLUoxetine  (PROZAC ) capsule 20 mg  20 mg Oral Daily Gregory Edsel Ruth, PA   20 mg at 12/06/23 9143   fluticasone  (FLONASE ) 50 MCG/ACT nasal spray 2 spray  2 spray Each Nare Daily PRN Gregory Edsel Ruth, PA       hydrALAZINE  (APRESOLINE ) injection 5 mg  5 mg Intravenous Q4H PRN Gregory Edsel Ruth, PA   5 mg at 12/05/23 0804   hydrocortisone  (ANUSOL -HC) suppository 25 mg  25 mg Rectal Daily PRN Gregory Edsel Ruth, PA       magnesium  citrate solution 1 Bottle  1 Bottle Oral Once PRN Gregory Edsel Ruth, PA       melatonin tablet 2.5 mg  2.5 mg Oral QHS Clois Fret, MD   2.5 mg at 12/06/23 2108   menthol -cetylpyridinium (CEPACOL) lozenge 3 mg  1 lozenge Oral PRN Gregory Edsel Ruth, PA       Or   phenol (CHLORASEPTIC) mouth spray 1 spray  1 spray Mouth/Throat PRN Gregory Edsel Ruth, PA       methocarbamol  (ROBAXIN ) tablet 500 mg  500 mg Oral Q6H PRN Koch, Danielle Lee, PA   500 mg at 12/06/23 9582   Or   methocarbamol  (ROBAXIN ) injection 500 mg  500 mg Intravenous Q6H PRN Gregory Edsel Ruth, PA       ondansetron  (ZOFRAN ) tablet 4 mg  4 mg Oral Q6H PRN Gregory Edsel Ruth, PA       Or   ondansetron  (ZOFRAN ) injection 4 mg  4 mg Intravenous Q6H PRN Gregory Edsel Ruth, PA       oxyCODONE  (Oxy IR/ROXICODONE ) immediate release tablet 10 mg   10 mg Oral Q4H PRN Koch, Danielle  Jama, PA   10 mg at 12/06/23 9144   oxyCODONE  (Oxy IR/ROXICODONE ) immediate release tablet 5 mg  5 mg Oral Q4H PRN Koch, Danielle Lee, PA   5 mg at 12/07/23 0701   polyethylene glycol (MIRALAX  / GLYCOLAX ) packet 17 g  17 g Oral Daily PRN Gregory Edsel Jama, PA   17 g at 12/06/23 1652   rosuvastatin  (CRESTOR ) tablet 5 mg  5 mg Oral Daily Gregory Edsel Jama, PA   5 mg at 12/06/23 2108   senna-docusate (Senokot-S) tablet 1 tablet  1 tablet Oral BID Clois Fret, MD   1 tablet at 12/06/23 2109   sodium chloride  flush (NS) 0.9 % injection 3 mL  3 mL Intravenous Q12H Gregory Edsel Jama, PA   3 mL at 12/06/23 2100   sodium chloride  flush (NS) 0.9 % injection 3 mL  3 mL Intravenous PRN Gregory Edsel Jama, PA       sorbitol  70 % solution 30 mL  30 mL Oral Daily PRN Gregory Edsel Jama, PA       sorbitol , magnesium  hydroxide, mineral oil, glycerin (SMOG) enema  960 mL Rectal Daily PRN Clois Fret, MD         Review of Systems Full ROS  was asked and was negative except for the information on the HPI  Physical Exam Blood pressure (!) 146/95, pulse 78, temperature 98.2 F (36.8 C), temperature source Oral, resp. rate 16, height 5' 1 (1.549 m), weight 80.9 kg, SpO2 93%. CONSTITUTIONAL: She is in no acute distress.  Chaperone present. EYES: Pupils are equal, round, Sclera are non-icteric. EARS, NOSE, MOUTH AND THROAT: The oropharynx is clear. The oral mucosa is pink and moist. Hearing is intact to voice. LYMPH NODES:  Lymph nodes in the neck are normal. RESPIRATORY:  Lungs are clear. There is normal respiratory effort, with equal breath sounds bilaterally, and without pathologic use of accessory muscles. CARDIOVASCULAR: Heart is regular without murmurs, gallops, or rubs. GI: The abdomen is  soft, nontender, and nondistended. There are no palpable masses. There is no hepatosplenomegaly. There are normal bowel sounds in all quadrants.  Rectal: There is evidence  of noncomplicated internal hemorrhoids grade 3 but there is evidence of an anal fissure with tenderness to palpation of posterior midline.  No obvious masses.  MUSCULOSKELETAL: Normal muscle strength and tone. No cyanosis or edema.   SKIN: Turgor is good and there are no pathologic skin lesions or ulcers. NEUROLOGIC: Motor and sensation is grossly normal. Cranial nerves are grossly intact. PSYCH:  Oriented to person, place and time. Affect is normal.  Data Reviewed I have personally reviewed the patient's imaging, laboratory findings and medical records.    Assessment/Plan 83 year old status post now with findings consistent with anal fissure.  No evidence of active bleeding and no need for emergent surgical intervention.  Discussed with her in detail about fiber as well as nitroglycerin  cream and sitz bath's.  I will go ahead and prescribe those while she is in-house.  Will be happy to see her in the office in 2 to 3 weeks to further discuss anal fissure therapies and paraesophageal hernia as well. I personally spent a total of 75 minutes in the care of the patient today including performing a medically appropriate exam/evaluation, counseling and educating, placing orders, referring and communicating with other health care professionals, documenting clinical information in the EHR, independently interpreting and reviewing images studies and coordinating care.    Laneta Luna, MD FACS General Surgeon 12/07/2023, 9:06 AM

## 2023-12-07 NOTE — Progress Notes (Signed)
   Neurosurgery Progress Note  History: Sierra Sparks is s/p L2-S1 decompression  POD6: Needs to have BM  POD5: Having some back pain but otherwise well.  POD4: Doing well. No HA.  POD3: Pt doing well this morning. Reports better pain controll  POD2: Pt doing well this morning with minimal complaints  POD1:  PT in ICU for lumbar drain management   Physical Exam: Vitals:   12/06/23 1953 12/07/23 0304  BP: (!) 141/87 133/82  Pulse: 91 77  Resp: 17 15  Temp: 98.7 F (37.1 C) 97.6 F (36.4 C)  SpO2: 96% 92%    AA Ox3 CNI  Strength:5/5 throughout BLE  Incision: c/d/I  Data:  Other tests/results: see results review  Assessment/Plan:  Sierra Sparks is an 83 y.o presenting with lumbar stenosis s/p L2-S1 decompression complicated by a CSF leak resulting in lumbar drain placement. Drain is now removed.   - pain control - DVT prophylaxis - PTOT - Dispo planning - Work on [employer] today  Reeves Daisy MD Department of Neurosurgery

## 2023-12-07 NOTE — Progress Notes (Signed)
 Physical Therapy Treatment Patient Details Name: Sierra Sparks MRN: [id number] DOB: Mar 10, 1941 Today's Date: 12/07/2023   History of Present Illness Pt admitted for L2-S1 decompression with CSF complication resulting in lumbar drain placement on 12/01/23. Pt is POD 3 at time of evaluation. Cleared for participation.    PT Comments  Pt ready for session.  Stated increased cramping post LE's today.  She is assisted to EOB with mod a x 1 with cues to log roll but does roll back and transition more to long sitting before EOB.  Steady in sitting.  She stands with min/mod a x 1 and transfers to [facility].  No blood noted today in urine.  She is inc and controlled by pad but no further void in [facility].  Stands again with RW and min a x 1 and turns with more confident steps today but limited to recliner.  Does not feel she can safely walk further at this time.  She does remain in recliner and does seated AROM.  Opts to have legs down for comfort.     If plan is discharge home, recommend the following: A little help with walking and/or transfers;A little help with bathing/dressing/bathroom;Assistance with cooking/housework;Assist for transportation;Help with stairs or ramp for entrance   Can travel by private vehicle        Equipment Recommendations  BSC/3in1    Recommendations for Other Services       Precautions / Restrictions Precautions Precautions: Back;Fall Precaution/Restrictions Comments: lumbar drain, no brace required Restrictions Weight Bearing Restrictions Per Provider Order: No     Mobility  Bed Mobility Overal bed mobility: Needs Assistance Bed Mobility: Supine to Sit     Supine to sit: Min assist, HOB elevated, Used rails     General bed mobility comments: cues for rolling but does roll back and transition to long sitting despite assist. Patient Response: Cooperative  Transfers Overall transfer level: Needs assistance Equipment used: Rolling walker (2 wheels) Transfers: Sit  to/from Stand Sit to Stand: Min assist, Mod assist   Step pivot transfers: Min assist            Ambulation/Gait   Gait Distance (Feet): 5 Feet Assistive device: Rolling walker (2 wheels) Gait Pattern/deviations: Step-to pattern, Decreased step length - right, Decreased step length - left Gait velocity: decreased         Stairs             Wheelchair Mobility     Tilt Bed Tilt Bed Patient Response: Cooperative  Modified Rankin (Stroke Patients Only)       Balance Overall balance assessment: Needs assistance Sitting-balance support: Feet supported, Bilateral upper extremity supported Sitting balance-Leahy Scale: Good     Standing balance support: Bilateral upper extremity supported Standing balance-Leahy Scale: Poor Standing balance comment: heavy BUE support needed at RW to stand.                            Communication Communication Communication: No apparent difficulties  Cognition Arousal: Alert Behavior During Therapy: WFL for tasks assessed/performed, Anxious   PT - Cognitive impairments: No apparent impairments                       PT - Cognition Comments: Pt is pleasant and agreeable to PT. Following commands: Intact      Cueing Cueing Techniques: Verbal cues, Tactile cues  Exercises      General Comments  Pertinent Vitals/Pain Pain Assessment Pain Assessment: Faces Faces Pain Scale: Hurts whole lot Pain Location: post LE's crampy Pain Descriptors / Indicators: Cramping Pain Intervention(s): Limited activity within patient's tolerance, Monitored during session, Repositioned    Home Living                          Prior Function            PT Goals (current goals can now be found in the care plan section) Progress towards PT goals: Progressing toward goals    Frequency    7X/week      PT Plan      Co-evaluation              AM-PAC PT 6 Clicks Mobility   Outcome  Measure  Help needed turning from your back to your side while in a flat bed without using bedrails?: A Lot Help needed moving from lying on your back to sitting on the side of a flat bed without using bedrails?: A Lot Help needed moving to and from a bed to a chair (including a wheelchair)?: A Little Help needed standing up from a chair using your arms (e.g., wheelchair or bedside chair)?: A Little Help needed to walk in hospital room?: A Little Help needed climbing 3-5 steps with a railing? : Total 6 Click Score: 14    End of Session Equipment Utilized During Treatment: Gait belt Activity Tolerance: Patient limited by pain;Patient limited by fatigue Patient left: in chair;with call bell/phone within reach;with family/visitor present;with chair alarm set Nurse Communication: Mobility status PT Visit Diagnosis: Muscle weakness (generalized) (M62.81);Difficulty in walking, not elsewhere classified (R26.2);Pain     Time: 9072-9053 PT Time Calculation (min) (ACUTE ONLY): 19 min  Charges:    $Gait Training: 8-22 mins PT General Charges $$ ACUTE PT VISIT: 1 Visit                   Lauraine Gills, PTA 12/07/23, 10:12 AM

## 2023-12-08 ENCOUNTER — Inpatient Hospital Stay

## 2023-12-08 LAB — URINALYSIS, ROUTINE W REFLEX MICROSCOPIC
Bilirubin Urine: NEGATIVE
Glucose, UA: NEGATIVE mg/dL
Ketones, ur: NEGATIVE mg/dL
Nitrite: NEGATIVE
Protein, ur: NEGATIVE mg/dL
Specific Gravity, Urine: 1.02 (ref 1.005–1.030)
Squamous Epithelial / HPF: 0 /HPF (ref 0–5)
pH: 5 (ref 5.0–8.0)

## 2023-12-08 LAB — URINALYSIS, W/ REFLEX TO CULTURE (INFECTION SUSPECTED)
Bilirubin Urine: NEGATIVE
Glucose, UA: NEGATIVE mg/dL
Ketones, ur: NEGATIVE mg/dL
Nitrite: NEGATIVE
Protein, ur: NEGATIVE mg/dL
Specific Gravity, Urine: 1.021 (ref 1.005–1.030)
Squamous Epithelial / HPF: 0 /HPF (ref 0–5)
pH: 5 (ref 5.0–8.0)

## 2023-12-08 LAB — CBC
HCT: 33.8 % — ABNORMAL LOW (ref 36.0–46.0)
Hemoglobin: 11.2 g/dL — ABNORMAL LOW (ref 12.0–15.0)
MCH: 31.8 pg (ref 26.0–34.0)
MCHC: 33.1 g/dL (ref 30.0–36.0)
MCV: 96 fL (ref 80.0–100.0)
Platelets: 295 K/uL (ref 150–400)
RBC: 3.52 MIL/uL — ABNORMAL LOW (ref 3.87–5.11)
RDW: 13.4 % (ref 11.5–15.5)
WBC: 16.5 K/uL — ABNORMAL HIGH (ref 4.0–10.5)
nRBC: 0 % (ref 0.0–0.2)

## 2023-12-08 LAB — BASIC METABOLIC PANEL WITH GFR
Anion gap: 12 (ref 5–15)
BUN: 31 mg/dL — ABNORMAL HIGH (ref 8–23)
CO2: 23 mmol/L (ref 22–32)
Calcium: 9.1 mg/dL (ref 8.9–10.3)
Chloride: 101 mmol/L (ref 98–111)
Creatinine, Ser: 0.74 mg/dL (ref 0.44–1.00)
GFR, Estimated: >60 mL/min (ref >60)
Glucose, Bld: 149 mg/dL — ABNORMAL HIGH (ref 70–99)
Potassium: 4.1 mmol/L (ref 3.5–5.1)
Sodium: 136 mmol/L (ref 135–145)

## 2023-12-08 MED ORDER — SODIUM CHLORIDE 0.9 % IV SOLN
1.0000 g | INTRAVENOUS | Status: DC
  Administered 2023-12-08: 1 g via INTRAVENOUS
  Filled 2023-12-08 (×2): qty 10

## 2023-12-08 NOTE — Progress Notes (Signed)
 PT Cancellation Note  Patient Details Name: Sierra Sparks MRN: [id number] DOB: [birth year]   Cancelled Treatment:    Reason Eval/Treat Not Completed: Patient at procedure or test/unavailable  Pt taken for testing this AM.  Awaiting results.  Will continue as appropriate.   Lauraine Gills 12/08/2023, 1:16 PM

## 2023-12-08 NOTE — Plan of Care (Signed)
  Problem: Health Behavior/Discharge Planning: Goal: Ability to manage health-related needs will improve Outcome: Progressing   Problem: Activity: Goal: Risk for activity intolerance will decrease Outcome: Progressing   Problem: Nutrition: Goal: Adequate nutrition will be maintained Outcome: Progressing   

## 2023-12-08 NOTE — Plan of Care (Signed)
   Problem: Education: Goal: Knowledge of General Education information will improve Description Including pain rating scale, medication(s)/side effects and non-pharmacologic comfort measures Outcome: Progressing

## 2023-12-08 NOTE — Progress Notes (Addendum)
   Neurosurgery Progress Note  History: Sierra Sparks is s/p L2-S1 decompression  POD7: PT reported to have fever of 102 last night that improved with APAP. This morning she was requesting to get up to the bathroom.   POD6: Needs to have BM  POD5: Having some back pain but otherwise well.  POD4: Doing well. No HA.  POD3: Pt doing well this morning. Reports better pain controll  POD2: Pt doing well this morning with minimal complaints  POD1:  PT in ICU for lumbar drain management   Physical Exam: Vitals:   12/08/23 0326 12/08/23 0339  BP: (!) 145/116 (!) 143/79  Pulse: 97 98  Resp: 18   Temp: 100.2 F (37.9 C)   SpO2: 92%     AA Ox3 CNI  Strength:5/5 throughout BLE  Incision: c/d/I without signs of infection. There is no drainage. Sutures remain in place  Data:  Other tests/results: see results review  Assessment/Plan:  Sierra Sparks is an 83 y.o presenting with lumbar stenosis s/p L2-S1 decompression complicated by a CSF leak resulting in lumbar drain placement. Drain is now removed.   - pain control - DVT prophylaxis - PTOT - Dispo planning - Had BM yesterday - will follow up on fever work up once completed   Edsel Goods PA-C Department of Neurosurgery

## 2023-12-09 DIAGNOSIS — N390 Urinary tract infection, site not specified: Secondary | ICD-10-CM | POA: Diagnosis not present

## 2023-12-09 DIAGNOSIS — F329 Major depressive disorder, single episode, unspecified: Secondary | ICD-10-CM | POA: Diagnosis not present

## 2023-12-09 DIAGNOSIS — F4321 Adjustment disorder with depressed mood: Secondary | ICD-10-CM | POA: Diagnosis not present

## 2023-12-09 DIAGNOSIS — J309 Allergic rhinitis, unspecified: Secondary | ICD-10-CM | POA: Diagnosis not present

## 2023-12-09 DIAGNOSIS — I10 Essential (primary) hypertension: Secondary | ICD-10-CM | POA: Diagnosis not present

## 2023-12-09 DIAGNOSIS — K219 Gastro-esophageal reflux disease without esophagitis: Secondary | ICD-10-CM | POA: Diagnosis not present

## 2023-12-09 DIAGNOSIS — F330 Major depressive disorder, recurrent, mild: Secondary | ICD-10-CM | POA: Diagnosis not present

## 2023-12-09 DIAGNOSIS — I959 Hypotension, unspecified: Secondary | ICD-10-CM | POA: Diagnosis not present

## 2023-12-09 DIAGNOSIS — M549 Dorsalgia, unspecified: Secondary | ICD-10-CM | POA: Diagnosis not present

## 2023-12-09 DIAGNOSIS — M545 Low back pain, unspecified: Secondary | ICD-10-CM | POA: Diagnosis not present

## 2023-12-09 DIAGNOSIS — Z9889 Other specified postprocedural states: Secondary | ICD-10-CM | POA: Diagnosis not present

## 2023-12-09 DIAGNOSIS — R509 Fever, unspecified: Secondary | ICD-10-CM | POA: Diagnosis not present

## 2023-12-09 DIAGNOSIS — Z4889 Encounter for other specified surgical aftercare: Secondary | ICD-10-CM | POA: Diagnosis not present

## 2023-12-09 DIAGNOSIS — M4326 Fusion of spine, lumbar region: Secondary | ICD-10-CM | POA: Diagnosis not present

## 2023-12-09 DIAGNOSIS — M48062 Spinal stenosis, lumbar region with neurogenic claudication: Secondary | ICD-10-CM | POA: Diagnosis not present

## 2023-12-09 DIAGNOSIS — Z7401 Bed confinement status: Secondary | ICD-10-CM | POA: Diagnosis not present

## 2023-12-09 DIAGNOSIS — G9600 Cerebrospinal fluid leak, unspecified: Secondary | ICD-10-CM | POA: Diagnosis not present

## 2023-12-09 DIAGNOSIS — H81399 Other peripheral vertigo, unspecified ear: Secondary | ICD-10-CM | POA: Diagnosis not present

## 2023-12-09 MED ORDER — CIPROFLOXACIN HCL 250 MG PO TABS: 250.0000 mg | ORAL_TABLET | Freq: Two times a day (BID) | ORAL | Status: AC

## 2023-12-09 MED ORDER — METHOCARBAMOL 500 MG PO TABS: 500.0000 mg | ORAL_TABLET | Freq: Four times a day (QID) | ORAL | Status: DC | PRN

## 2023-12-09 MED ORDER — OXYCODONE HCL 5 MG PO TABS: 5.0000 mg | ORAL_TABLET | ORAL | 0 refills | Status: DC | PRN

## 2023-12-09 NOTE — Progress Notes (Signed)
 Occupational Therapy Treatment Patient Details Name: Sierra Sparks MRN: [id number] DOB: 1940-05-29 Today's Date: 12/09/2023   History of present illness Pt admitted for L2-S1 decompression with CSF complication resulting in lumbar drain placement on 12/01/23. Pt is POD 3 at time of evaluation. Cleared for participation.   OT comments  Pt seen for partial OT/PT co-treatment on this date with OT focus on safe and proficient ADL task engagement in standing. Upon arrival to room pt supine in bed with HOB elevated, agreeable to tx. Review of back precautions and log roll technique prior to getting OOB. Pt requires Min to Mod A for log roll to EOB, Min A x2 for step pivot from EOB to BSC at RW level, Max A for LB dressing task, Mod A for toileting tasks/clothing management.  Pt completed standing grooming/hygiene/oral care tasks at RW level while standing at sink with bilateral knee buckling noted with static standing.  Pt remains a very high fall risk due to bilateral LE buckling with recommendation to utilize [facility] for toileting to reduce risk of falls vs attempting ambulation to commode in bathroom. Pt making progress toward goals, will continue to follow POC. Discharge recommendation remains appropriate.        If plan is discharge home, recommend the following:  A lot of help with walking and/or transfers;A lot of help with bathing/dressing/bathroom;Assistance with cooking/housework;Direct supervision/assist for medications management;Direct supervision/assist for financial management;Assist for transportation;Help with stairs or ramp for entrance   Equipment Recommendations  Other (comment) (Defer to next venue of care)    Recommendations for Other Services      Precautions / Restrictions Precautions Precautions: Back;Fall Recall of Precautions/Restrictions: Intact Precaution/Restrictions Comments: no brace required Restrictions Weight Bearing Restrictions Per Provider Order: No        Mobility Bed Mobility Overal bed mobility: Needs Assistance Bed Mobility: Sit to Supine     Supine to sit: Min assist, HOB elevated, Used rails     General bed mobility comments: Cuing for bed mobility with application of back precautions/log roll technique    Transfers Overall transfer level: Needs assistance Equipment used: Rolling walker (2 wheels) Transfers: Sit to/from Stand Sit to Stand: +2 safety/equipment, Min assist           General transfer comment: Consistent cuing required for safe hand placement     Balance Overall balance assessment: Needs assistance Sitting-balance support: Feet supported, Bilateral upper extremity supported Sitting balance-Leahy Scale: Good     Standing balance support: Bilateral upper extremity supported, During functional activity Standing balance-Leahy Scale: Poor Standing balance comment: Pt required forearms balanced at sink level during grooming/hygiene tasks that required bilateral hand use.                           ADL either performed or assessed with clinical judgement   ADL Overall ADL's : Needs assistance/impaired     Grooming: Oral care;Minimal assistance;Standing;Wash/dry face;Brushing hair;Sitting Grooming Details (indicate cue type and reason): Standing at sink for oral care, seated hand combing hair             Lower Body Dressing: Maximal assistance;Cueing for sequencing;Cueing for back precautions;Sit to/from stand   Toilet Transfer: Minimal assistance;Cueing for safety;Cueing for sequencing;BSC/3in1;Rolling walker (2 wheels);+2 for physical assistance   Toileting- Clothing Manipulation and Hygiene: Moderate assistance;Sit to/from stand;Cueing for back precautions;+2 for physical assistance;Cueing for safety;Cueing for sequencing Toileting - Clothing Manipulation Details (indicate cue type and reason): Mod A for clothing management  and posterior perineal hygiene     Functional mobility during  ADLs: Moderate assistance;Cueing for safety;Cueing for sequencing;Rolling walker (2 wheels);+2 for physical assistance;+2 for safety/equipment      Extremity/Trunk Assessment Upper Extremity Assessment Upper Extremity Assessment: Generalized weakness            Vision Baseline Vision/History: 1 Wears glasses     Perception     Praxis     Communication Communication Communication: No apparent difficulties   Cognition Arousal: Alert Behavior During Therapy: WFL for tasks assessed/performed, Anxious               OT - Cognition Comments: slow processing                 Following commands: Intact        Cueing   Cueing Techniques: Verbal cues, Tactile cues, Visual cues  Exercises Other Exercises Other Exercises: reviewed log roll technique for bed mobility and review of back precautions during ADL task engagement.    Shoulder Instructions       General Comments      Pertinent Vitals/ Pain       Pain Assessment Pain Assessment: 0-10 Pain Score: 7  Faces Pain Scale: Hurts even more Pain Location: post LE's crampy and back, aching Pain Descriptors / Indicators: Aching Pain Intervention(s): Monitored during session, Repositioned  Home Living                                          Prior Functioning/Environment              Frequency  Min 2X/week        Progress Toward Goals  OT Goals(current goals can now be found in the care plan section)  Progress towards OT goals: Progressing toward goals  Acute Rehab OT Goals Potential to Achieve Goals: Fair  Plan      Co-evaluation      Reason for Co-Treatment: For patient/therapist safety;To address functional/ADL transfers PT goals addressed during session: Mobility/safety with mobility OT goals addressed during session: ADL's and self-care      AM-PAC OT 6 Clicks Daily Activity     Outcome Measure   Help from another person eating meals?: None Help from  another person taking care of personal grooming?: A Little Help from another person toileting, which includes using toliet, bedpan, or urinal?: A Lot Help from another person bathing (including washing, rinsing, drying)?: A Lot Help from another person to put on and taking off regular upper body clothing?: A Lot Help from another person to put on and taking off regular lower body clothing?: A Lot 6 Click Score: 15    End of Session Equipment Utilized During Treatment: Rolling walker (2 wheels);Gait belt  OT Visit Diagnosis: Unsteadiness on feet (R26.81);Muscle weakness (generalized) (M62.81);Pain   Activity Tolerance Patient limited by fatigue;Patient limited by pain;Patient tolerated treatment well   Patient Left Other (comment) (handoff to PT)   Nurse Communication Mobility status        Time: 9084-9041 OT Time Calculation (min): 43 min  Charges: OT General Charges $OT Visit: 1 Visit OT Treatments $Self Care/Home Management : 23-37 mins  Harlene Sharps OTR/L   Harlene LITTIE Sharps 12/09/2023, 10:20 AM

## 2023-12-09 NOTE — Discharge Summary (Signed)
 Discharge Summary  Patient ID: Sierra Sparks MRN: [id number] DOB/AGE: 01/12/1941 83 y.o.  Admit date: 12/01/2023 Discharge date: 12/09/2023  Admission Diagnoses: Lumbar Stenosis with neurogenic claudication  Discharge Diagnoses:  Principal Problem:   Status post lumbar spine operation Active Problems:   Lumbar stenosis with neurogenic claudication   CSF leak   Discharged Condition: good  Hospital Course:  Sierra Sparks is an 83 y.o presenting with severe lumbar stenosis and neurogenic claudication status post L2-S1 decompression.  Her intraoperative course was complicated by a CSF leak resulting in placement of a lumbar drain.  She was admitted to the ICU for lumbar drain management and kept on flat bedrest for 48 hours postoperatively.  Her drain was subsequently clamped for 24 hours and her incision remained stable without obvious signs of spinal fluid leak.  She was subsequently transferred out of the ICU.  She was seen and evaluated by therapy and deemed appropriate for discharge to inpatient rehab however the patient elected for discharge to SNF in [place].  On post-op day 5 she experienced increased pain which improved with medication changes.  She also experienced some bloody discharge when having a bowel movement on postop day 6 resulting in consultation of general surgery.  She was found to have an anal fissure and it was recommended that she increase her fiber intake as well as utilize nitroglycerin  cream and sitz bath's while in the hospital with recommendation to follow-up in 2 to 3 weeks outpatient. On the evening of POD6 she experienced a fever of 102.  A fever workup was completed and she was found to have a UTI which was treated with ceftriaxone  in the hospital and the patient was discharged with 3 days of Cipro .  Consults: general surgery  Significant Diagnostic Studies: labs:     Latest Ref Rng & Units 12/08/2023   10:27 AM 12/06/2023    3:34 PM 11/21/2023     8:14 AM  CBC  WBC 4.0 - 10.5 K/uL 16.5  6.8  4.4   Hemoglobin 12.0 - 15.0 g/dL 88.7  88.2  86.8   Hematocrit 36.0 - 46.0 % 33.8  34.8  40.0   Platelets 150 - 400 K/uL 295  265  239       Latest Ref Rng & Units 12/08/2023   10:27 AM 11/21/2023    8:14 AM 05/30/2023    8:05 AM  BMP  Glucose 70 - 99 mg/dL 850  892  890   BUN 8 - 23 mg/dL 31  21  19    Creatinine 0.44 - 1.00 mg/dL 9.25  9.40  9.34   Sodium 135 - 145 mmol/L 136  141  140   Potassium 3.5 - 5.1 mmol/L 4.1  3.8  4.2   Chloride 98 - 111 mmol/L 101  105  104   CO2 22 - 32 mmol/L 23  23  31    Calcium  8.9 - 10.3 mg/dL 9.1  9.4  9.5    Urinalysis    Component Value Date/Time   COLORURINE YELLOW (A) 12/08/2023 1623   APPEARANCEUR HAZY (A) 12/08/2023 1623   LABSPEC 1.021 12/08/2023 1623   PHURINE 5.0 12/08/2023 1623   GLUCOSEU NEGATIVE 12/08/2023 1623   HGBUR MODERATE (A) 12/08/2023 1623   BILIRUBINUR NEGATIVE 12/08/2023 1623   KETONESUR NEGATIVE 12/08/2023 1623   PROTEINUR NEGATIVE 12/08/2023 1623   NITRITE NEGATIVE 12/08/2023 1623   LEUKOCYTESUR SMALL (A) 12/08/2023 1623    12/08/23 chest xray  FINDINGS: AP portable view of the  chest demonstrates low lung volumes. No focal lung disease. Heart size is prominent but likely accentuated by the low lung volumes. Negative for a pneumothorax. No acute bone abnormality.   IMPRESSION: Low lung volumes without focal lung disease.     Electronically Signed   By: Juliene Balder M.D.   On: 12/08/2023 14:03  Lower extremity ultrasound 8/25  FINDINGS: RIGHT LOWER EXTREMITY   Common Femoral Vein: No evidence of thrombus. Normal compressibility, respiratory phasicity and response to augmentation.   Saphenofemoral Junction: No evidence of thrombus. Normal compressibility and Sparks on color Doppler imaging.   Profunda Femoral Vein: No evidence of thrombus. Normal compressibility and Sparks on color Doppler imaging.   Femoral Vein: No evidence of thrombus. Normal  compressibility, respiratory phasicity and response to augmentation.   Popliteal Vein: No evidence of thrombus. Normal compressibility, respiratory phasicity and response to augmentation.   Calf Veins: No evidence of thrombus. Normal compressibility and Sparks on color Doppler imaging.   Superficial Great Saphenous Vein: No evidence of thrombus. Normal compressibility.   Venous Reflux:  None.   Other Findings:  None.   LEFT LOWER EXTREMITY   Common Femoral Vein: No evidence of thrombus. Normal compressibility, respiratory phasicity and response to augmentation.   Saphenofemoral Junction: No evidence of thrombus. Normal compressibility and Sparks on color Doppler imaging.   Profunda Femoral Vein: No evidence of thrombus. Normal compressibility and Sparks on color Doppler imaging.   Femoral Vein: No evidence of thrombus. Normal compressibility, respiratory phasicity and response to augmentation.   Popliteal Vein: No evidence of thrombus. Normal compressibility, respiratory phasicity and response to augmentation.   Calf Veins: No evidence of thrombus. Normal compressibility and Sparks on color Doppler imaging.   Superficial Great Saphenous Vein: No evidence of thrombus. Normal compressibility.   Venous Reflux:  None.   Other Findings:  None.   IMPRESSION: No evidence of deep venous thrombosis in either lower extremity.     Electronically Signed   By: Wilkie Lent M.D.   On: 12/08/2023 15:44  Treatments: antibiotics: ceftriaxone  and surgery: as above. Please see separately dictated operative report for further details   Discharge Exam: Blood pressure 135/82, pulse 81, temperature 98.7 F (37.1 C), resp. rate 16, height 5' 1 (1.549 m), weight 80.9 kg, SpO2 98%. AA Ox3 CNI   Strength:5/5 throughout BLE   Incision: c/d/I without signs of infection. There is no drainage. Sutures remain in place  Disposition: Discharge disposition: 03-Skilled Nursing  Facility       Discharge Instructions     Discharge wound care:   Complete by: As directed    Sutures can be removed on post op day 14 (9/1)   Incentive spirometry RT   Complete by: As directed       Allergies as of 12/09/2023   No Known Allergies      Medication List     TAKE these medications    acetaminophen  500 MG tablet Commonly known as: TYLENOL  Take 1,000 mg by mouth in the morning.   amLODipine  5 MG tablet Commonly known as: NORVASC  Take 1 tablet by mouth once daily   Calcium  1200 1200-1000 MG-UNIT Chew Chew 600 mg by mouth 2 (two) times daily.   ciprofloxacin  250 MG tablet Commonly known as: Cipro  Take 1 tablet (250 mg total) by mouth 2 (two) times daily for 3 days.   diazepam  2 MG tablet Commonly known as: VALIUM  Take 2 mg by mouth every 8 (eight) hours as needed (vertigo).   ezetimibe   10 MG tablet Commonly known as: ZETIA  Take 1 tablet by mouth once daily   famotidine -calcium  carbonate-magnesium  hydroxide 10-800-165 MG chewable tablet Commonly known as: PEPCID  COMPLETE Chew 1 tablet by mouth daily as needed.   Ferrex 150 150 MG capsule Generic drug: iron  polysaccharides Take 1 capsule by mouth twice daily   FLUoxetine  20 MG capsule Commonly known as: PROZAC  Take 1 capsule by mouth once daily   fluticasone  50 MCG/ACT nasal spray Commonly known as: FLONASE  Place 2 sprays into both nostrils daily.   hydrocortisone  25 MG suppository Commonly known as: ANUSOL -HC Place 1 suppository (25 mg total) rectally at bedtime. What changed:  when to take this reasons to take this   meclizine  25 MG tablet Commonly known as: ANTIVERT  Take 1 tablet (25 mg total) by mouth 3 (three) times daily as needed for dizziness.   meloxicam  7.5 MG tablet Commonly known as: MOBIC  Take 7.5 mg by mouth in the morning.   methocarbamol  500 MG tablet Commonly known as: ROBAXIN  Take 1 tablet (500 mg total) by mouth every 6 (six) hours as needed for muscle  spasms.   multivitamin with minerals Tabs tablet Take 1 tablet by mouth in the morning.   oxyCODONE  5 MG immediate release tablet Commonly known as: Oxy IR/ROXICODONE  Take 1 tablet (5 mg total) by mouth every 4 (four) hours as needed for moderate pain (pain score 4-6).   rosuvastatin  5 MG tablet Commonly known as: CRESTOR  Take 1 tablet by mouth once daily   Vitamin C 500 MG Caps Take 500 mg by mouth in the morning.   vitamin D3 25 MCG tablet Commonly known as: CHOLECALCIFEROL Take 1,000 Units by mouth in the morning.               Discharge Care Instructions  (From admission, onward)           Start     Ordered   12/09/23 0000  Discharge wound care:       Comments: Sutures can be removed on post op day 14 (9/1)   12/09/23 1008            Contact information for follow-up providers     Pabon, [place] F, MD Follow up on 12/29/2023.   Specialty: General Surgery Contact information: [address] [place] [address] [phone]              Contact information for after-discharge care     Destination     [facility], [place] .   Service: Skilled Nursing Contact information: [address] [place]  [phone]                     Signed: Edsel Jama Goods 12/09/2023, 10:09 AM

## 2023-12-09 NOTE — Progress Notes (Signed)
   Neurosurgery Progress Note  History: Sierra Sparks is s/p L2-S1 decompression  POD8: Pt appears much improved this morning. Reports good pain control.   POD7: PT reported to have fever of 102 last night that improved with APAP. This morning she was requesting to get up to the bathroom.   POD6: Needs to have BM  POD5: Having some back pain but otherwise well.  POD4: Doing well. No HA.  POD3: Pt doing well this morning. Reports better pain controll  POD2: Pt doing well this morning with minimal complaints  POD1:  PT in ICU for lumbar drain management   Physical Exam: Vitals:   12/09/23 0443 12/09/23 0807  BP: (!) 145/94 135/82  Pulse: 83 81  Resp: 16 16  Temp: 99.5 F (37.5 C) 98.7 F (37.1 C)  SpO2: 95% 98%    AA Ox3 CNI  Strength:5/5 throughout BLE  Incision: c/d/I without signs of infection. There is no drainage. Sutures remain in place  Data:  Other tests/results: see results review  Assessment/Plan:  Sierra Sparks is an 83 y.o presenting with lumbar stenosis s/p L2-S1 decompression complicated by a CSF leak resulting in lumbar drain placement. Drain is now removed.   - pain control - DVT prophylaxis - PTOT - Dispo planning - pt now having loose stools. Will hold bowel regimen - antibiotics started for UTI. No additional fevers. Will d/c with cipro   Edsel Goods PA-C Department of Neurosurgery

## 2023-12-09 NOTE — Progress Notes (Signed)
 Physical Therapy Treatment Patient Details Name: Sierra Sparks MRN: [id number] DOB: November 27, 1940 Today's Date: 12/09/2023   History of Present Illness Pt admitted for L2-S1 decompression with CSF complication resulting in lumbar drain placement on 12/01/23. Pt is POD 3 at time of evaluation. Cleared for participation.    PT Comments  Pt in with OT upon arrival.  Standing upon arrival getting to [facility].  Pt inc of urine during standing and she is assisted to [facility] with new linens and care provided.  Loose small BM.  She is able to take a few steps to sink and remain standing for an extended time for brushing her teeth.  She needs seated rest on BSC before taking several small steps back to bed.  While standing, she frequently has small knee buckles but she is able to correct with min a.  She does take a few steps backwards to bed and needs mod a x 1 to return to supine.  Elects to return to bed vs chair as she is awaiting transport to SNF.  Bed is placed in chair position to eat.    Of note, pt and husband stated she was able to walk to bathroom just prior to session.  She is generally fatigued with increased pain so further gait was deferred at this time for pt safety and comfort.     If plan is discharge home, recommend the following: A little help with walking and/or transfers;A little help with bathing/dressing/bathroom;Assistance with cooking/housework;Assist for transportation;Help with stairs or ramp for entrance   Can travel by private vehicle        Equipment Recommendations       Recommendations for Other Services       Precautions / Restrictions Precautions Precautions: Back;Fall Precaution/Restrictions Comments: , no brace required Restrictions Weight Bearing Restrictions Per Provider Order: No     Mobility  Bed Mobility Overal bed mobility: Needs Assistance Bed Mobility: Sit to Supine       Sit to supine: Mod assist     Patient Response:  Cooperative  Transfers Overall transfer level: Needs assistance Equipment used: Rolling walker (2 wheels) Transfers: Sit to/from Stand Sit to Stand: +2 safety/equipment, Min assist                Ambulation/Gait Ambulation/Gait assistance: Min assist, +2 safety/equipment Gait Distance (Feet): 3 Feet Assistive device: Rolling walker (2 wheels) Gait Pattern/deviations: Step-to pattern, Decreased step length - right, Decreased step length - left Gait velocity: decreased         Stairs             Wheelchair Mobility     Tilt Bed Tilt Bed Patient Response: Cooperative  Modified Rankin (Stroke Patients Only)       Balance Overall balance assessment: Needs assistance Sitting-balance support: Feet supported, Bilateral upper extremity supported Sitting balance-Leahy Scale: Good     Standing balance support: Bilateral upper extremity supported Standing balance-Leahy Scale: Poor Standing balance comment: heavy BUE support needed at RW to stand. knees buckling frequently but does self correct                            Communication Communication Communication: No apparent difficulties  Cognition Arousal: Alert Behavior During Therapy: WFL for tasks assessed/performed, Anxious   PT - Cognitive impairments: No apparent impairments                       PT -  Cognition Comments: Pt is pleasant and agreeable to PT. Following commands: Intact      Cueing Cueing Techniques: Verbal cues, Tactile cues  Exercises      General Comments        Pertinent Vitals/Pain Pain Assessment Pain Assessment: 0-10 Pain Score: 7  Pain Intervention(s): Monitored during session, Limited activity within patient's tolerance, Repositioned    Home Living                          Prior Function            PT Goals (current goals can now be found in the care plan section) Progress towards PT goals: Progressing toward goals     Frequency    7X/week      PT Plan      Co-evaluation PT/OT/SLP Co-Evaluation/Treatment: Yes Reason for Co-Treatment: For patient/therapist safety;To address functional/ADL transfers PT goals addressed during session: Mobility/safety with mobility OT goals addressed during session: ADL's and self-care      AM-PAC PT 6 Clicks Mobility   Outcome Measure  Help needed turning from your back to your side while in a flat bed without using bedrails?: A Lot Help needed moving from lying on your back to sitting on the side of a flat bed without using bedrails?: A Lot Help needed moving to and from a bed to a chair (including a wheelchair)?: A Little Help needed standing up from a chair using your arms (e.g., wheelchair or bedside chair)?: A Little Help needed to walk in hospital room?: A Little Help needed climbing 3-5 steps with a railing? : Total 6 Click Score: 14    End of Session Equipment Utilized During Treatment: Gait belt Activity Tolerance: Patient limited by pain;Patient limited by fatigue Patient left: with call bell/phone within reach;with family/visitor present;in bed;with bed alarm set Nurse Communication: Mobility status PT Visit Diagnosis: Muscle weakness (generalized) (M62.81);Difficulty in walking, not elsewhere classified (R26.2);Pain     Time: 9062-8998 PT Time Calculation (min) (ACUTE ONLY): 24 min  Charges:    $Gait Training: 8-22 mins PT General Charges $$ ACUTE PT VISIT: 1 Visit                   Lauraine Gills, PTA 12/09/23, 10:13 AM

## 2023-12-09 NOTE — TOC Transition Note (Signed)
 Transition of Care [facility]) - Discharge Note   Patient Details  Name: Sierra Sparks MRN: [id number] Date of Birth: Aug 14, 1940  Transition of Care [facility]) CM/SW Contact:  Alvaro Louder, LCSW Phone Number: 12/09/2023, 10:28 AM   Clinical Narrative:   LCSWA received insurance approval for patient to admit to SNF [facility]. LCSWA confirmed with MD that patient is stable for discharge. LCSWA notified the patient and they are in agreement with discharge. LCSWA confirmed bed is available at SNF. Transport arranged with [facility] for next available.  Number to call for report is [phone], RM:105   TOC signing off  Final next level of care: Skilled Nursing Facility Barriers to Discharge: No Barriers Identified   Patient Goals and CMS Choice            Discharge Placement              Patient chooses bed at: [facility], [place] Patient to be transferred to facility by: [facility] Name of family member notified: Self Patient and family notified of of transfer: 12/09/23  Discharge Plan and Services Additional resources added to the After Visit Summary for                                       Social Drivers of Health (SDOH) Interventions SDOH Screenings   Food Insecurity: No Food Insecurity (12/01/2023)  Housing: Low Risk  (12/01/2023)  Transportation Needs: No Transportation Needs (12/01/2023)  Utilities: Not At Risk (12/01/2023)  Alcohol Screen: Low Risk  (05/30/2023)  Depression (PHQ2-9): Low Risk  (11/24/2023)  Financial Resource Strain: Low Risk  (05/30/2023)  Physical Activity: Inactive (05/30/2023)  Social Connections: Socially Integrated (12/01/2023)  Stress: No Stress Concern Present (05/30/2023)  Tobacco Use: Low Risk  (12/01/2023)  Health Literacy: Adequate Health Literacy (05/30/2023)     Readmission Risk Interventions     No data to display

## 2023-12-09 NOTE — Plan of Care (Signed)
  Problem: Education: Goal: Knowledge of General Education information will improve Description: Including pain rating scale, medication(s)/side effects and non-pharmacologic comfort measures Outcome: Adequate for Discharge   Problem: Health Behavior/Discharge Planning: Goal: Ability to manage health-related needs will improve Outcome: Adequate for Discharge   Problem: Clinical Measurements: Goal: Ability to maintain clinical measurements within normal limits will improve Outcome: Adequate for Discharge Goal: Will remain free from infection Outcome: Adequate for Discharge Goal: Diagnostic test results will improve Outcome: Adequate for Discharge Goal: Respiratory complications will improve Outcome: Adequate for Discharge Goal: Cardiovascular complication will be avoided Outcome: Adequate for Discharge   Problem: Activity: Goal: Risk for activity intolerance will decrease Outcome: Adequate for Discharge   Problem: Nutrition: Goal: Adequate nutrition will be maintained Outcome: Adequate for Discharge   Problem: Coping: Goal: Level of anxiety will decrease Outcome: Adequate for Discharge   Problem: Elimination: Goal: Will not experience complications related to bowel motility Outcome: Adequate for Discharge Goal: Will not experience complications related to urinary retention Outcome: Adequate for Discharge   Problem: Pain Managment: Goal: General experience of comfort will improve and/or be controlled Outcome: Adequate for Discharge   Problem: Safety: Goal: Ability to remain free from injury will improve Outcome: Adequate for Discharge   Problem: Skin Integrity: Goal: Risk for impaired skin integrity will decrease Outcome: Adequate for Discharge   Problem: Education: Goal: Ability to verbalize activity precautions or restrictions will improve Outcome: Adequate for Discharge Goal: Knowledge of the prescribed therapeutic regimen will improve Outcome: Adequate for  Discharge Goal: Understanding of discharge needs will improve Outcome: Adequate for Discharge   Problem: Activity: Goal: Ability to avoid complications of mobility impairment will improve Outcome: Adequate for Discharge Goal: Ability to tolerate increased activity will improve Outcome: Adequate for Discharge Goal: Will remain free from falls Outcome: Adequate for Discharge   Problem: Bowel/Gastric: Goal: Gastrointestinal status for postoperative course will improve Outcome: Adequate for Discharge   Problem: Clinical Measurements: Goal: Ability to maintain clinical measurements within normal limits will improve Outcome: Adequate for Discharge Goal: Postoperative complications will be avoided or minimized Outcome: Adequate for Discharge Goal: Diagnostic test results will improve Outcome: Adequate for Discharge   Problem: Pain Management: Goal: Pain level will decrease Outcome: Adequate for Discharge   Problem: Skin Integrity: Goal: Will show signs of wound healing Outcome: Adequate for Discharge   Problem: Health Behavior/Discharge Planning: Goal: Identification of resources available to assist in meeting health care needs will improve Outcome: Adequate for Discharge   Problem: Bladder/Genitourinary: Goal: Urinary functional status for postoperative course will improve Outcome: Adequate for Discharge

## 2023-12-11 LAB — URINE CULTURE: Culture: >=100000 — AB

## 2023-12-12 DIAGNOSIS — I10 Essential (primary) hypertension: Secondary | ICD-10-CM | POA: Diagnosis not present

## 2023-12-12 DIAGNOSIS — Z9889 Other specified postprocedural states: Secondary | ICD-10-CM | POA: Diagnosis not present

## 2023-12-12 DIAGNOSIS — N390 Urinary tract infection, site not specified: Secondary | ICD-10-CM | POA: Diagnosis not present

## 2023-12-12 DIAGNOSIS — M4326 Fusion of spine, lumbar region: Secondary | ICD-10-CM | POA: Diagnosis not present

## 2023-12-13 LAB — CULTURE, BLOOD (ROUTINE X 2)
Culture: NO GROWTH
Culture: NO GROWTH
Special Requests: ADEQUATE
Special Requests: ADEQUATE

## 2023-12-14 NOTE — Progress Notes (Unsigned)
   REFERRING PHYSICIAN:  Randeen Laine LABOR, Md [address] [place],  [place] [address]  DOS: 12/01/23   decompression L2-S1   HISTORY OF PRESENT ILLNESS: Sierra Sparks is approximately 2 weeks status post above surgery.  Surgery was complicated by CSF leak resulting in placement of lumbar drain. She was seen by general surgery for anal fissure- was to f/u in 2-3 weeks. She was diagnosed with UTI and was discharged to SNF on [facility] .   She was discharged on oxycodone  and robaxin .   She is at [facility]. She is in PT and is ambulating with a walker.   She continues with pain in her back and legs. Since the surgery, she notes some bladder and bowel incontinence. No urge to go and it comes out when she stands up. No perineal numbness. No headaches.   PHYSICAL EXAMINATION:  General: Patient is well developed, well nourished, calm, collected, and in no apparent distress.   NEUROLOGICAL:  General: In no acute distress.   Awake, alert, oriented to person, place, and time.  Pupils equal round and reactive to light.  Facial tone is symmetric.     Strength:           Side Iliopsoas Quads Hamstring PF DF EHL  R 5 5 5 5 5 5   L 5 5 5 5 5 5    Incision c/d/I, sutures were removed.    ROS (Neurologic):  Negative except as noted above  IMAGING: Nothing new to review.   ASSESSMENT/PLAN:  Sierra Sparks is doing fair s/p above surgery. No weakness on exam. No numbness in her legs or saddle region. Treatment options reviewed with patient and following plan made:   - Bowel and bladder issues are not likely lumbar mediated (she has no numbness, saddle anesthesia, or weakness in her legs), but will get lumbar MRI to evaluate.  If this looks okay, consider referral to urology/GI.  - I have advised the patient to lift up to 10 pounds until 6 weeks after surgery (follow up with Dr. Clois).  - Reviewed wound care.  - No bending, twisting, or lifting.  - Continue with PT at SNF for  mobilization.  - Continue on current medications including prn oxycodone  and prn robaxin  - Will plan to do phone visit once I have MRI results back to review them.  - Follow up as scheduled in 4 weeks and prn.   BP was slightly elevated. No symptoms of chest pain, shortness of breath, blurry vision, or headaches. BP is being checked at [facility].     Advised to contact the office if any questions or concerns arise.  Above plan reviewed with Dr. Cl[facility] and he agrees.   Glade Boys PA-C Department of neurosurgery

## 2023-12-15 DIAGNOSIS — F4321 Adjustment disorder with depressed mood: Secondary | ICD-10-CM | POA: Diagnosis not present

## 2023-12-15 DIAGNOSIS — F330 Major depressive disorder, recurrent, mild: Secondary | ICD-10-CM | POA: Diagnosis not present

## 2023-12-16 ENCOUNTER — Encounter: Payer: Self-pay | Admitting: Orthopedic Surgery

## 2023-12-16 ENCOUNTER — Ambulatory Visit (INDEPENDENT_AMBULATORY_CARE_PROVIDER_SITE_OTHER): Admitting: Orthopedic Surgery

## 2023-12-16 VITALS — BP 140/98 | Temp 98.2°F | Ht 61.0 in | Wt 178.0 lb

## 2023-12-16 DIAGNOSIS — M48062 Spinal stenosis, lumbar region with neurogenic claudication: Secondary | ICD-10-CM

## 2023-12-16 DIAGNOSIS — Z9889 Other specified postprocedural states: Secondary | ICD-10-CM

## 2023-12-16 NOTE — Addendum Note (Signed)
 Addended by: HILMA HASTINGS on: 12/16/2023 12:25 PM   Modules accepted: Orders

## 2023-12-23 ENCOUNTER — Telehealth: Payer: Self-pay

## 2023-12-23 NOTE — Telephone Encounter (Signed)
 We have received a fax in regards to refilling the Oxycodone .   Can you verify her pharmacy? On the fax it says [facility], but in her chart it says [facility].

## 2023-12-23 NOTE — Telephone Encounter (Signed)
 DOS: 12/01/23   decompression L2-S1   Spoke with patient. She is at SNF ([facility] in [place]). They are providing her medication and should give her a script for oxycodone  when she leaves.   She will let us  know if any issues.  Damien- she has not been called about MRI yet. Looks like it was approved, can you let her know who to call to schedule it?

## 2023-12-24 NOTE — Telephone Encounter (Signed)
 Please tell the patient to call [facility] scheduling to her get her MRI scheduled at [phone]

## 2023-12-25 NOTE — Telephone Encounter (Signed)
 Patient notified and provided with phone number to scheduling.

## 2023-12-28 ENCOUNTER — Ambulatory Visit
Admission: RE | Admit: 2023-12-28 | Discharge: 2023-12-28 | Disposition: A | Discharge status: Home / Self Care | Source: Ambulatory Visit | Attending: Orthopedic Surgery | Admitting: Orthopedic Surgery

## 2023-12-28 DIAGNOSIS — M48061 Spinal stenosis, lumbar region without neurogenic claudication: Secondary | ICD-10-CM | POA: Diagnosis not present

## 2023-12-28 DIAGNOSIS — M48062 Spinal stenosis, lumbar region with neurogenic claudication: Secondary | ICD-10-CM | POA: Insufficient documentation

## 2023-12-28 DIAGNOSIS — Z9889 Other specified postprocedural states: Secondary | ICD-10-CM | POA: Diagnosis not present

## 2023-12-28 DIAGNOSIS — M47816 Spondylosis without myelopathy or radiculopathy, lumbar region: Secondary | ICD-10-CM | POA: Diagnosis not present

## 2023-12-28 DIAGNOSIS — M51379 Other intervertebral disc degeneration, lumbosacral region without mention of lumbar back pain or lower extremity pain: Secondary | ICD-10-CM | POA: Diagnosis not present

## 2023-12-28 DIAGNOSIS — M51369 Other intervertebral disc degeneration, lumbar region without mention of lumbar back pain or lower extremity pain: Secondary | ICD-10-CM | POA: Diagnosis not present

## 2023-12-29 ENCOUNTER — Telehealth: Payer: Self-pay

## 2023-12-29 NOTE — Transitions of Care (Post Inpatient/ED Visit) (Cosign Needed)
   12/29/2023  Name: Sierra Sparks MRN: [id number] DOB: 09-Nov-1940  Today's TOC FU Call Status: Today's TOC FU Call Status:: Successful TOC FU Call Completed TOC FU Call Complete Date: 12/29/23 Patient's Name and Date of Birth confirmed.  Transition Care Management Follow-up Telephone Call Date of Discharge: 12/26/23 Discharge Facility: Other [facility]) Name of Other (Non[facility]) Discharge Facility: [facility] Type of Discharge: Inpatient Admission Primary Inpatient Discharge Diagnosis:: s/p back surgery How have you been since you were released from the hospital?: Better Any questions or concerns?: No  Items Reviewed: Did you receive and understand the discharge instructions provided?: Yes Medications obtained,verified, and reconciled?: Yes (Medications Reviewed) Any new allergies since your discharge?: No Dietary orders reviewed?: Yes Type of Diet Ordered:: heart healthy Do you have support at home?: Yes People in Home [RPT]: spouse Name of Support/Comfort Primary Source: Renay Pouch  Medications Reviewed Today: Medications Reviewed Today   Medications were not reviewed in this encounter     Home Care and Equipment/Supplies: Were Home Health Services Ordered?: Yes Name of Home Health Agency:: pt doesn't recall Has Agency set up a time to come to your home?: No Any new equipment or medical supplies ordered?: No  Functional Questionnaire: Do you need assistance with bathing/showering or dressing?: No Do you need assistance with meal preparation?: No Do you need assistance with eating?: No Do you have difficulty maintaining continence: No Do you need assistance with getting out of bed/getting out of a chair/moving?: No Do you have difficulty managing or taking your medications?: No  Follow up appointments reviewed: PCP Follow-up appointment confirmed?: Yes Date of PCP follow-up appointment?: 01/07/24 Follow-up Provider: [facility] Follow-up appointment confirmed?: Yes Date of Specialist follow-up appointment?: 01/13/24 Follow-Up Specialty Provider:: Dr Reeves Daisy Do you need transportation to your follow-up appointment?: No Do you understand care options if your condition(s) worsen?: Yes-patient verbalized understanding    SIGNATURE Stefano ORN, CMA  [facility] AWV Team Direct Dial: [phone]

## 2023-12-30 ENCOUNTER — Telehealth: Payer: Self-pay | Admitting: *Deleted

## 2023-12-30 NOTE — Telephone Encounter (Signed)
 Copied from CRM [id number]. Topic: General - Other >> Dec 29, 2023  3:46 PM Mercedes MATSU wrote: Reason for CRM: patient willl start her home health care services on Thursday day after tomorrow. Call back not needed.

## 2024-01-01 ENCOUNTER — Telehealth: Payer: Self-pay

## 2024-01-01 DIAGNOSIS — H8110 Benign paroxysmal vertigo, unspecified ear: Secondary | ICD-10-CM | POA: Diagnosis not present

## 2024-01-01 DIAGNOSIS — D649 Anemia, unspecified: Secondary | ICD-10-CM | POA: Diagnosis not present

## 2024-01-01 DIAGNOSIS — F419 Anxiety disorder, unspecified: Secondary | ICD-10-CM | POA: Diagnosis not present

## 2024-01-01 DIAGNOSIS — K602 Anal fissure, unspecified: Secondary | ICD-10-CM | POA: Diagnosis not present

## 2024-01-01 DIAGNOSIS — G970 Cerebrospinal fluid leak from spinal puncture: Secondary | ICD-10-CM | POA: Diagnosis not present

## 2024-01-01 DIAGNOSIS — K219 Gastro-esophageal reflux disease without esophagitis: Secondary | ICD-10-CM | POA: Diagnosis not present

## 2024-01-01 DIAGNOSIS — I10 Essential (primary) hypertension: Secondary | ICD-10-CM | POA: Diagnosis not present

## 2024-01-01 DIAGNOSIS — M1990 Unspecified osteoarthritis, unspecified site: Secondary | ICD-10-CM | POA: Diagnosis not present

## 2024-01-01 DIAGNOSIS — E785 Hyperlipidemia, unspecified: Secondary | ICD-10-CM | POA: Diagnosis not present

## 2024-01-01 NOTE — Telephone Encounter (Signed)
 Copied from CRM [id number]. Topic: Clinical - Medical Advice >> Jan 01, 2024  1:07 PM Thersia BROCKS wrote: Reason for CRM: Patient husband called in stated patient home therapist came and took information and ask questions, patient as appointment o 09/24 and the therapist wanted to see if there is anyway Dr.Tower could see patient sooner

## 2024-01-02 ENCOUNTER — Telehealth: Payer: Self-pay

## 2024-01-02 NOTE — Telephone Encounter (Signed)
 Copied from CRM #[id number]. Topic: Clinical - Home Health Verbal Orders >> Jan 02, 2024  3:10 PM Chiquita SQUIBB wrote: Caller/Agency: Amy with [name] Number: [phone] Service Requested: Physical Therapy Frequency: 1 time a week for 1 week, 2 times a week for 2 weeks, 1 week for 3 weeks Any new concerns about the patient? No- Amy is asking if the nurse can provide any medicine allergies.

## 2024-01-02 NOTE — Telephone Encounter (Signed)
 Please ok those verbal orders and convey med allergies if applicable

## 2024-01-02 NOTE — Telephone Encounter (Signed)
 Called # left in message and the # is not a working #, will follow up on Monday

## 2024-01-05 NOTE — Telephone Encounter (Signed)
 Spoke with Sierra Sparks, of [name], informing her Dr Randeen agrees to verbal order for services requested. Also, informed her, per pt's chart, there are no med allergies. She verbalizes understanding and expresses her thanks for calling back.

## 2024-01-06 DIAGNOSIS — M1990 Unspecified osteoarthritis, unspecified site: Secondary | ICD-10-CM | POA: Diagnosis not present

## 2024-01-06 DIAGNOSIS — I10 Essential (primary) hypertension: Secondary | ICD-10-CM | POA: Diagnosis not present

## 2024-01-06 DIAGNOSIS — H8110 Benign paroxysmal vertigo, unspecified ear: Secondary | ICD-10-CM | POA: Diagnosis not present

## 2024-01-06 DIAGNOSIS — G970 Cerebrospinal fluid leak from spinal puncture: Secondary | ICD-10-CM | POA: Diagnosis not present

## 2024-01-06 DIAGNOSIS — D649 Anemia, unspecified: Secondary | ICD-10-CM | POA: Diagnosis not present

## 2024-01-06 DIAGNOSIS — E785 Hyperlipidemia, unspecified: Secondary | ICD-10-CM | POA: Diagnosis not present

## 2024-01-06 DIAGNOSIS — K219 Gastro-esophageal reflux disease without esophagitis: Secondary | ICD-10-CM | POA: Diagnosis not present

## 2024-01-06 DIAGNOSIS — K602 Anal fissure, unspecified: Secondary | ICD-10-CM | POA: Diagnosis not present

## 2024-01-07 ENCOUNTER — Ambulatory Visit: Admitting: Family Medicine

## 2024-01-07 ENCOUNTER — Encounter: Payer: Self-pay | Admitting: Family Medicine

## 2024-01-07 ENCOUNTER — Encounter: Payer: Self-pay | Admitting: Orthopedic Surgery

## 2024-01-07 ENCOUNTER — Ambulatory Visit (INDEPENDENT_AMBULATORY_CARE_PROVIDER_SITE_OTHER): Admitting: Orthopedic Surgery

## 2024-01-07 ENCOUNTER — Ambulatory Visit: Payer: Self-pay | Admitting: Family Medicine

## 2024-01-07 VITALS — BP 130/65 | HR 96 | Temp 97.9°F | Ht 61.0 in | Wt 170.2 lb

## 2024-01-07 DIAGNOSIS — Z9889 Other specified postprocedural states: Secondary | ICD-10-CM | POA: Diagnosis not present

## 2024-01-07 DIAGNOSIS — K602 Anal fissure, unspecified: Secondary | ICD-10-CM

## 2024-01-07 DIAGNOSIS — I10 Essential (primary) hypertension: Secondary | ICD-10-CM | POA: Diagnosis not present

## 2024-01-07 DIAGNOSIS — D509 Iron deficiency anemia, unspecified: Secondary | ICD-10-CM

## 2024-01-07 DIAGNOSIS — K5903 Drug induced constipation: Secondary | ICD-10-CM | POA: Diagnosis not present

## 2024-01-07 DIAGNOSIS — E876 Hypokalemia: Secondary | ICD-10-CM | POA: Insufficient documentation

## 2024-01-07 DIAGNOSIS — K5900 Constipation, unspecified: Secondary | ICD-10-CM | POA: Insufficient documentation

## 2024-01-07 DIAGNOSIS — M48062 Spinal stenosis, lumbar region with neurogenic claudication: Secondary | ICD-10-CM

## 2024-01-07 HISTORY — DX: Hypokalemia: E87.6

## 2024-01-07 LAB — CBC WITH DIFFERENTIAL/PLATELET
Basophils Absolute: 0 K/uL (ref 0.0–0.1)
Basophils Relative: 0.4 % (ref 0.0–3.0)
Eosinophils Absolute: 0 K/uL (ref 0.0–0.7)
Eosinophils Relative: 0.8 % (ref 0.0–5.0)
HCT: 38.9 % (ref 36.0–46.0)
Hemoglobin: 12.7 g/dL (ref 12.0–15.0)
Lymphocytes Relative: 44.1 % (ref 12.0–46.0)
Lymphs Abs: 1.7 K/uL (ref 0.7–4.0)
MCHC: 32.7 g/dL (ref 30.0–36.0)
MCV: 93.9 fl (ref 78.0–100.0)
Monocytes Absolute: 0.4 K/uL (ref 0.1–1.0)
Monocytes Relative: 11.2 % (ref 3.0–12.0)
Neutro Abs: 1.7 K/uL (ref 1.4–7.7)
Neutrophils Relative %: 43.5 % (ref 43.0–77.0)
Platelets: 239 K/uL (ref 150.0–400.0)
RBC: 4.14 Mil/uL (ref 3.87–5.11)
RDW: 13 % (ref 11.5–15.5)
WBC: 3.9 K/uL — ABNORMAL LOW (ref 4.0–10.5)

## 2024-01-07 LAB — BASIC METABOLIC PANEL WITH GFR
BUN: 17 mg/dL (ref 6–23)
CO2: 29 meq/L (ref 19–32)
Calcium: 9.7 mg/dL (ref 8.4–10.5)
Chloride: 103 meq/L (ref 96–112)
Creatinine, Ser: 0.62 mg/dL (ref 0.40–1.20)
GFR: 83.72 mL/min (ref >60.00)
Glucose, Bld: 164 mg/dL — ABNORMAL HIGH (ref 70–99)
Potassium: 3.3 meq/L — ABNORMAL LOW (ref 3.5–5.1)
Sodium: 142 meq/L (ref 135–145)

## 2024-01-07 MED ORDER — METHOCARBAMOL 500 MG PO TABS: 500.0000 mg | ORAL_TABLET | Freq: Three times a day (TID) | ORAL | 0 refills | Status: DC | PRN

## 2024-01-07 NOTE — Assessment & Plan Note (Signed)
 Some chronic/some acute after spinal surgery  Causing anal fissure to be worse   Encouraged to get back on her miralax  daily (baseline used to take every other day)  Based on response can titrate If not helpful- stool softener and fleets enema prn   Encouraged strongly to eat more produce (she dislikes it) and keep up good fluid intake   Some concerns for incontinence-will address with neuro surgeon

## 2024-01-07 NOTE — Patient Instructions (Addendum)
 Get miralax  and use it daily until bowel movements are more normal and regular  You can go up or down on the dose based on response (takes 3 days to work) A stool softener is ok as well  Fleet enema is fine if needed   Let us  know if not improving   Keep drinking fluids  Focus on more produce in your diet (veggies and fruit)  Whole grains are ok also    Follow up with neuro surgery as planned    Lab today for chemistries and cbc   Take care of yourself

## 2024-01-07 NOTE — Progress Notes (Signed)
 Telephone Visit- Progress Note: Referring Physician:  Tower, Sparks LABOR, MD [address] [place],  [place] [address]  Primary Physician:  Sierra Sparks LABOR, MD  This visit was performed via telephone.  Patient location: home Provider location: office  I spent a total of 10 minutes non-face-to-face activities for this visit on the date of this encounter including review of current clinical condition and response to treatment.    Patient has given verbal consent to this telephone visits and we reviewed the limitations of a telephone visit. Patient wishes to proceed.    Chief Complaint:  review lumbar MRI  DOS: 12/01/23   decompression L2-S1   HISTORY OF PRESENT ILLNESS:  Last seen by me on 12/16/23 for her 2 week postop visit. She continues with bowel and bladder incontinence since her surgery. No urge to go and it comes out when she stands up. No perineal numbness.   Phone visit scheduled to review her lumbar MRI results.   Her back pain has improved and is minimal. Her right leg pain is better but she continues with more constant posterior left leg pain to her knee. No numbness, tingling, or weakness.   She is doing HHPT. She is walking well.   She is taking oxycodone  rarely. She is taking robaxin  bid. She needs refill of robaxin .   She continues with incontinence of bowel and bladder as above- no change. Saw PCP today and she wanted her to start miralax  daily.   Exam: No exam done as this was a telephone encounter.     Imaging: Lumbar MRI dated 12/28/23:  FINDINGS: Segmentation: Standard.   Alignment:  Physiologic lumbar alignment is maintained.   Vertebrae: Operative changes of posterior decompression from L3 through L5. Degenerative endplate marrow changes at a few levels. No compression fractures. Areas of T1/T2 hyperintensity in lumbar vertebrae most consistent with intraosseous hemangioma or focal fat.   Conus medullaris and cauda equina: The conus medullaris  terminates at the level of L1-L2. The distal spinal cord signal intensity is normal.   Paraspinal and other soft tissues: The visualized abdomen and pelvis show no soft tissue abnormality. The visualized aorta is normal.   Disc levels:   L1-L2: Disc is normal in configuration. Mild bilateral facet arthropathy. No neuroforaminal stenosis. No spinal canal stenosis.   L2-L3: Disc bulge. Moderate bilateral facet arthropathy. Moderate bilateral neuroforaminal stenosis. Moderate spinal canal stenosis.   L3-L4: Disc bulge. Moderate bilateral facet arthropathy. Moderate bilateral neuroforaminal stenosis. Mild spinal canal stenosis.   L4-L5: Disc bulge. Moderate bilateral facet arthropathy. Moderate right and mild left neuroforaminal stenosis. Mild spinal canal stenosis.   L5-S1: Disc bulge. Severe right and moderate left facet arthropathy. Moderate bilateral neuroforaminal stenosis. Mild spinal canal stenosis.   IMPRESSION: 1. Moderate canal stenosis at L2-L3 secondary to disc bulging and facet arthropathy. 2. Moderate foraminal stenoses at multiple levels secondary to disc bulging and facet arthropathy. 3. Posterior decompression from L3 through L5.     Electronically Signed   By: Sierra Sparks M.D.   On: 01/02/2024 15:45  I have personally reviewed the images and agree with the above interpretation.  Above MRI reviewed with Sierra Sparks prior to her visit.   Assessment and Plan: Sierra Sparks is doing fair s/p above surgery. Her LBP and right leg pain are improved. Still with more constant posterior left leg pain to her knee.   She continues with incontinence of bowel and bladder.   MRI reviewed with Sierra Sparks- do  not see spine medited cause of bowel/bladder incontinence.   Treatment options reviewed with patient and following plan made:    - Bowel and bladder issues do not appear to be lumbar mediated.  - Referral placed for urology and GI.  - PCP gave her  instructions for bowels today. She will follow these. If improved, can cancel GI consult.  - Refill of robaxin  sent to pharmacy. Reviewed dosing and side effects.  - Follow up with Sierra Sparks next week as scheduled for her 6 week postop visit.   Sierra Boys PA-C Neurosurgery

## 2024-01-07 NOTE — Assessment & Plan Note (Signed)
 Reviewed hospital records, lab results and studies in detail  Complicated by CSF leak (resolved/ required addn hosp treatment ), uti (treated) and anal fissure with constipation (med related?) Now incontinence w/o saddle anesthesia  Recent MRI/ has follow up with neuro surg to review   Doing PT at home after rehab stay and getting stronger  Hope constipation will improve as she needs less medication for pain   Re check bmet and cbc today (had elevated wbc and mild anemia post osteoporosis)

## 2024-01-07 NOTE — Assessment & Plan Note (Signed)
 Noted during recent hospitalization for spine surgery Was treated with nitroglycerin  cream/sitz bath) Still painful -also constipated Will work on treating constipation (see a/p) -hope will improve as she comes off pain medication   Call back and Er precautions noted in detail today

## 2024-01-07 NOTE — Assessment & Plan Note (Signed)
 bp in fair control at this time  BP Readings from Last 1 Encounters:  01/07/24 130/65   No changes needed Most recent labs reviewed  Disc lifstyle change with low sodium diet and exercise  Continues amlodipine  5 mg daily   Bmet today (s/p spinal surgery/monitoring)

## 2024-01-07 NOTE — Progress Notes (Signed)
 Subjective:    Patient ID: Sierra Sparks, female    DOB: July 07, 1940, 83 y.o.   MRN: [id number]  HPI  Wt Readings from Last 3 Encounters:  01/07/24 170 lb 4 oz (77.2 kg)  12/16/23 178 lb (80.7 kg)  12/01/23 178 lb 5.6 oz (80.9 kg)   32.17 kg/m  Vitals:   01/07/24 1024  BP: 130/65  Pulse: 96  Temp: 97.9 F (36.6 C)  SpO2: 95%    Pt presents for follow up of hospital / rehab stay after lumbar spine surgery  Was in [facility]    Had L2-S1 decompression with Dr Seymour  Intraoperative course complicated by CSF leak and lumbar drain in ICU Did experience some blood with bm and also dx with anal fissure (nitroglycerin  cream and fiber/sitz bath)  Then uti requiring 3 d of cipro    Repeat MRI on 9/19 IMPRESSION:  1. Moderate canal stenosis at L2-L3 secondary to disc bulging and  facet arthropathy.  2. Moderate foraminal stenoses at multiple levels secondary to disc  bulging and facet arthropathy.  3. Posterior decompression from L3 through L5.   Still incontinent  Has appointment with Dr CINDERELLA next week to make a plan   Oxycodone  for pain - only when she really needs it /not often  Diazepam  for spasm  Meloxicam  for inflam/pain  Still taking ferrex    Lab Results  Component Value Date   NA 142 01/07/2024   K 3.3 (L) 01/07/2024   CO2 29 01/07/2024   GLUCOSE 164 (H) 01/07/2024   BUN 17 01/07/2024   CREATININE 0.62 01/07/2024   CALCIUM  9.7 01/07/2024   GFR 83.72 01/07/2024   GFRNONAA >60 12/08/2023   Did have a uti /hospital   Lab Results  Component Value Date   WBC 3.9 (L) 01/07/2024   HGB 12.7 01/07/2024   HCT 38.9 01/07/2024   MCV 93.9 01/07/2024   PLT 239.0 01/07/2024   PT- at home  Feels stronger  Has had cramps -in both legs now just the left No foot drop that she knows of     HTN bp is stable today  No cp or palpitations or headaches or edema  No side effects to medicines  BP Readings from Last 3 Encounters:  01/07/24 130/65   12/16/23 (!) 140/98  12/09/23 111/70    Amlodipine  5 mg daily  Lab Results  Component Value Date   NA 142 01/07/2024   K 3.3 (L) 01/07/2024   CO2 29 01/07/2024   GLUCOSE 164 (H) 01/07/2024   BUN 17 01/07/2024   CREATININE 0.62 01/07/2024   CALCIUM  9.7 01/07/2024   GFR 83.72 01/07/2024   GFRNONAA >60 12/08/2023   Prediabetes Lab Results  Component Value Date   HGBA1C 6.0 (A) 11/24/2023   HGBA1C 6.1 05/30/2023   Some rectal pain ever since surgery  Constipation- fleet enema (not painful)  Rectal pain is a little better   Using a hemorrhoid cream currently      Patient Active Problem List   Diagnosis Date Noted   Constipation 01/07/2024   Status post lumbar spine operation 12/01/2023   Lumbar stenosis with neurogenic claudication 12/01/2023   CSF leak 12/01/2023   Pre-operative clearance 11/24/2023   Falls frequently 06/12/2023   Peripheral vertigo 12/17/2022   Prediabetes 05/23/2022   Irregular heart beat 06/22/2021   Colon cancer screening 05/18/2021   Encounter for screening mammogram for breast cancer 03/31/2017   Hearing loss in right ear 08/30/2016   Anal fissure  03/06/2016   Internal hemorrhoids 02/26/2016   Routine general medical examination at a health care facility 02/03/2015   Estrogen deficiency 02/03/2015   Encounter for Medicare annual wellness exam 08/12/2012   Glaucoma 08/12/2012   History of colon polyps 05/15/2011   Anemia 05/15/2011   Allergic rhinitis 03/02/2008   Osteoporosis 01/05/2007   HYPERCHOLESTEROLEMIA 10/24/2006   Depression with anxiety 10/24/2006   Essential hypertension 10/24/2006   HEMORRHOIDS 10/24/2006   FIBROCYSTIC BREAST DISEASE 10/24/2006   OSTEOARTHRITIS 10/24/2006   Past Medical History:  Diagnosis Date   Allergic rhinitis, cause unspecified    Allergy    Anemia    Anxiety state, unspecified    Arthritis    Cataract    forming    Colon polyps    Depressive disorder, not elsewhere classified    Diffuse  cystic mastopathy    Disorder of bone and cartilage, unspecified    Diverticulosis    External hemorrhoids    Gallstones    GERD (gastroesophageal reflux disease)    Glaucoma    narrow angle thatwas corrected with laser surgery    Osteoarthrosis, unspecified whether generalized or localized, unspecified site    hands   Osteoporosis    Pre-diabetes    Pure hypercholesterolemia    Thyroid  disease    years past- no meds currently    Unspecified essential hypertension    Unspecified hemorrhoids without mention of complication    Past Surgical History:  Procedure Laterality Date   BREAST BIOPSY Left 08/11/2020   11:00 3cmfn Q clip u/s bx papilloma   BREAST EXCISIONAL BIOPSY Left 08/25/2020   surgical exc papilloma with tag placement tag no.[id number]   BREAST LUMPECTOMY WITH RADIO FREQUENCY LOCALIZER Left 08/25/2020   Procedure: BREAST LUMPECTOMY WITH RADIO FREQUENCY LOCALIZER;  Surgeon: Tye Millet, DO;  Location: [facility];  Service: General;  Laterality: Left;   BUNIONECTOMY Left 10/1999   CESAREAN SECTION  1981   CHOLECYSTECTOMY     COLONOSCOPY  01/2002   polyp; hemorrhoids;diverticulosis   COLONOSCOPY  2013   diverticulosis   DEXA  06/2000   osteopenia   DEXA  04/2003   Stable   HAND SURGERY Left    DIP fusion-left   HEMORRHOID SURGERY     LUMBAR LAMINECTOMY/DECOMPRESSION MICRODISCECTOMY N/A 12/01/2023   Procedure: LUMBAR LAMINECTOMY/DECOMPRESSION MICRODISCECTOMY 4 LEVEL;  Surgeon: Clois Fret, MD;  Location: [facility];  Service: Neurosurgery;  Laterality: N/A;  L2-S1 POSTERIOR SPINAL DECOMPRESSION   POLYPECTOMY     TONSILLECTOMY     childhood   Social History   Tobacco Use   Smoking status: Never   Smokeless tobacco: Never  Vaping Use   Vaping status: Never Used  Substance Use Topics   Alcohol use: Yes    Alcohol/week: 3.0 standard drinks of alcohol    Types: 3 Glasses of wine per week    Comment: Occasional-wine, 3 per week   Drug use: No   Family History   Problem Relation Age of Onset   Stroke Mother    Coronary artery disease Mother 67   Hypertension Mother    Hyperlipidemia Mother    Arthritis Father    Lung cancer Father        + smoker   Hypertension Sister    Migraines Sister    Colon cancer Brother        ? primary colon   Liver cancer Brother    Cancer Brother        type unknown   Stroke Maternal  Grandmother        hemorrhagic   Cancer Maternal Grandmother        female cancer type unknown   Bone cancer Paternal Uncle    Esophageal cancer Neg Hx    Rectal cancer Neg Hx    Stomach cancer Neg Hx    Breast cancer Neg Hx    Colon polyps Neg Hx    No Known Allergies Current Outpatient Medications on File Prior to Visit  Medication Sig Dispense Refill   acetaminophen  (TYLENOL ) 500 MG tablet Take 1,000 mg by mouth in the morning.     amLODipine  (NORVASC ) 5 MG tablet Take 1 tablet by mouth once daily 90 tablet 1   Ascorbic Acid (VITAMIN C) 500 MG CAPS Take 500 mg by mouth in the morning.     Calcium  Carbonate-Vit D-Min (CALCIUM  1200) 1200-1000 MG-UNIT CHEW Chew 600 mg by mouth 2 (two) times daily.     diazepam  (VALIUM ) 2 MG tablet Take 2 mg by mouth every 8 (eight) hours as needed (vertigo).     diphenhydrAMINE (BENADRYL) 25 MG tablet Take 25 mg by mouth every 6 (six) hours as needed.     ezetimibe  (ZETIA ) 10 MG tablet Take 1 tablet by mouth once daily 90 tablet 1   famotidine -calcium  carbonate-magnesium  hydroxide (PEPCID  COMPLETE) 10-800-165 MG chewable tablet Chew 1 tablet by mouth daily as needed.     FLUoxetine  (PROZAC ) 20 MG capsule Take 1 capsule by mouth once daily 90 capsule 1   fluticasone  (FLONASE ) 50 MCG/ACT nasal spray Place 2 sprays into both nostrils daily. 16 g 6   hydrocortisone  (ANUSOL -HC) 25 MG suppository Place 1 suppository (25 mg total) rectally at bedtime. 12 suppository 1   Inulin (FIBER CHOICE PO) Take 1 tablet by mouth at bedtime.     iron  polysaccharides (FERREX 150) 150 MG capsule Take 1 capsule by  mouth twice daily 180 capsule 2   meclizine  (ANTIVERT ) 25 MG tablet Take 1 tablet (25 mg total) by mouth 3 (three) times daily as needed for dizziness. 30 tablet 0   meloxicam  (MOBIC ) 7.5 MG tablet Take 7.5 mg by mouth in the morning.     Multiple Vitamin (MULTIVITAMIN WITH MINERALS) TABS tablet Take 1 tablet by mouth in the morning.     oxyCODONE  (OXY IR/ROXICODONE ) 5 MG immediate release tablet Take 1 tablet (5 mg total) by mouth every 4 (four) hours as needed for moderate pain (pain score 4-6). 30 tablet 0   rosuvastatin  (CRESTOR ) 5 MG tablet Take 1 tablet by mouth once daily 90 tablet 1   triamcinolone  cream (KENALOG) 0.1 % Apply 1 Application topically 2 (two) times daily.     vitamin D3 (CHOLECALCIFEROL) 25 MCG tablet Take 1,000 Units by mouth in the morning.     No current facility-administered medications on file prior to visit.    Review of Systems  Constitutional:  Negative for activity change, appetite change, fatigue, fever and unexpected weight change.  HENT:  Negative for congestion, rhinorrhea, sore throat and trouble swallowing.   Eyes:  Negative for pain, redness, itching and visual disturbance.  Respiratory:  Negative for cough, chest tightness, shortness of breath and wheezing.   Cardiovascular:  Negative for chest pain and palpitations.  Gastrointestinal:  Negative for abdominal pain, blood in stool, constipation, diarrhea and nausea.  Endocrine: Negative for cold intolerance, heat intolerance, polydipsia and polyuria.  Genitourinary:  Negative for difficulty urinating, dysuria, frequency and urgency.       Incontinence   Musculoskeletal:  Positive for back pain. Negative for arthralgias, joint swelling and myalgias.  Skin:  Negative for pallor and rash.  Neurological:  Negative for dizziness, tremors, weakness, numbness and headaches.  Hematological:  Negative for adenopathy. Does not bruise/bleed easily.  Psychiatric/Behavioral:  Negative for decreased concentration  and dysphoric mood. The patient is not nervous/anxious.        Objective:   Physical Exam Constitutional:      General: She is not in acute distress.    Appearance: Normal appearance. She is well-developed. She is obese. She is not ill-appearing or diaphoretic.  HENT:     Head: Normocephalic and atraumatic.     Mouth/Throat:     Mouth: Mucous membranes are moist.  Eyes:     Conjunctiva/sclera: Conjunctivae normal.     Pupils: Pupils are equal, round, and reactive to light.  Neck:     Thyroid : No thyromegaly.     Vascular: No carotid bruit or JVD.  Cardiovascular:     Rate and Rhythm: Normal rate and regular rhythm.     Heart sounds: Normal heart sounds.     No gallop.  Pulmonary:     Effort: Pulmonary effort is normal. No respiratory distress.     Breath sounds: Normal breath sounds. No wheezing or rales.  Abdominal:     General: There is no distension or abdominal bruit.     Palpations: Abdomen is soft. There is no mass.     Tenderness: There is no abdominal tenderness. There is no guarding or rebound.     Comments: Sitting  Soft/non tender   Musculoskeletal:     Cervical back: Normal range of motion and neck supple.     Right lower leg: No edema.     Left lower leg: No edema.     Comments: Limited rom LS  Sitting  Ankles are puffy without pitting  Lymphadenopathy:     Cervical: No cervical adenopathy.  Skin:    General: Skin is warm and dry.     Coloration: Skin is not pale.     Findings: No rash.  Neurological:     Mental Status: She is alert.     Cranial Nerves: No cranial nerve deficit.     Motor: Weakness present.     Coordination: Coordination normal.     Deep Tendon Reflexes: Reflexes are normal and symmetric. Reflexes normal.  Psychiatric:        Mood and Affect: Mood normal.     Comments: Good spirits pleasant           Assessment & Plan:   Problem List Items Addressed This Visit       Cardiovascular and Mediastinum   Essential  hypertension   bp in fair control at this time  BP Readings from Last 1 Encounters:  01/07/24 130/65   No changes needed Most recent labs reviewed  Disc lifstyle change with low sodium diet and exercise  Continues amlodipine  5 mg daily   Bmet today (s/p spinal surgery/monitoring)       Relevant Orders   Basic metabolic panel with GFR (Completed)   CBC with Differential/Platelet (Completed)     Digestive   Anal fissure   Noted during recent hospitalization for spine surgery Was treated with nitroglycerin  cream/sitz bath) Still painful -also constipated Will work on treating constipation (see a/p) -hope will improve as she comes off pain medication   Call back and Er precautions noted in detail today  Other   Status post lumbar spine operation   Reviewed hospital records, lab results and studies in detail  Complicated by CSF leak (resolved/ required addn hosp treatment ), uti (treated) and anal fissure with constipation (med related?) Now incontinence w/o saddle anesthesia  Recent MRI/ has follow up with neuro surg to review   Doing PT at home after rehab stay and getting stronger  Hope constipation will improve as she needs less medication for pain   Re check bmet and cbc today (had elevated wbc and mild anemia post osteoporosis)       Constipation - Primary   Some chronic/some acute after spinal surgery  Causing anal fissure to be worse   Encouraged to get back on her miralax  daily (baseline used to take every other day)  Based on response can titrate If not helpful- stool softener and fleets enema prn   Encouraged strongly to eat more produce (she dislikes it) and keep up good fluid intake   Some concerns for incontinence-will address with neuro surgeon       Anemia   Post operative/spine surgery Lab Results  Component Value Date   WBC 16.5 (H) 12/08/2023   HGB 11.2 (L) 12/08/2023   HCT 33.8 (L) 12/08/2023   MCV 96.0 12/08/2023   PLT 295  12/08/2023   Taking iron  , ferrex 150 mg bid  May worsen constipation-will get back on miralax 

## 2024-01-07 NOTE — Assessment & Plan Note (Signed)
 Post operative/spine surgery Lab Results  Component Value Date   WBC 16.5 (H) 12/08/2023   HGB 11.2 (L) 12/08/2023   HCT 33.8 (L) 12/08/2023   MCV 96.0 12/08/2023   PLT 295 12/08/2023   Taking iron  , ferrex 150 mg bid  May worsen constipation-will get back on miralax 

## 2024-01-08 DIAGNOSIS — M1990 Unspecified osteoarthritis, unspecified site: Secondary | ICD-10-CM | POA: Diagnosis not present

## 2024-01-08 DIAGNOSIS — D649 Anemia, unspecified: Secondary | ICD-10-CM | POA: Diagnosis not present

## 2024-01-08 DIAGNOSIS — E785 Hyperlipidemia, unspecified: Secondary | ICD-10-CM | POA: Diagnosis not present

## 2024-01-08 DIAGNOSIS — K219 Gastro-esophageal reflux disease without esophagitis: Secondary | ICD-10-CM | POA: Diagnosis not present

## 2024-01-08 DIAGNOSIS — H8110 Benign paroxysmal vertigo, unspecified ear: Secondary | ICD-10-CM | POA: Diagnosis not present

## 2024-01-08 DIAGNOSIS — K602 Anal fissure, unspecified: Secondary | ICD-10-CM | POA: Diagnosis not present

## 2024-01-08 DIAGNOSIS — F419 Anxiety disorder, unspecified: Secondary | ICD-10-CM | POA: Diagnosis not present

## 2024-01-08 DIAGNOSIS — G970 Cerebrospinal fluid leak from spinal puncture: Secondary | ICD-10-CM | POA: Diagnosis not present

## 2024-01-09 DIAGNOSIS — E785 Hyperlipidemia, unspecified: Secondary | ICD-10-CM | POA: Diagnosis not present

## 2024-01-09 DIAGNOSIS — G970 Cerebrospinal fluid leak from spinal puncture: Secondary | ICD-10-CM | POA: Diagnosis not present

## 2024-01-09 DIAGNOSIS — Z87440 Personal history of urinary (tract) infections: Secondary | ICD-10-CM

## 2024-01-09 DIAGNOSIS — F419 Anxiety disorder, unspecified: Secondary | ICD-10-CM | POA: Diagnosis not present

## 2024-01-09 DIAGNOSIS — H8110 Benign paroxysmal vertigo, unspecified ear: Secondary | ICD-10-CM | POA: Diagnosis not present

## 2024-01-09 DIAGNOSIS — Z8719 Personal history of other diseases of the digestive system: Secondary | ICD-10-CM

## 2024-01-09 DIAGNOSIS — M1990 Unspecified osteoarthritis, unspecified site: Secondary | ICD-10-CM | POA: Diagnosis not present

## 2024-01-09 DIAGNOSIS — D649 Anemia, unspecified: Secondary | ICD-10-CM | POA: Diagnosis not present

## 2024-01-09 DIAGNOSIS — I10 Essential (primary) hypertension: Secondary | ICD-10-CM | POA: Diagnosis not present

## 2024-01-09 DIAGNOSIS — F32A Depression, unspecified: Secondary | ICD-10-CM

## 2024-01-09 DIAGNOSIS — K602 Anal fissure, unspecified: Secondary | ICD-10-CM | POA: Diagnosis not present

## 2024-01-09 DIAGNOSIS — K219 Gastro-esophageal reflux disease without esophagitis: Secondary | ICD-10-CM | POA: Diagnosis not present

## 2024-01-13 ENCOUNTER — Ambulatory Visit: Admitting: Neurosurgery

## 2024-01-13 ENCOUNTER — Encounter: Payer: Self-pay | Admitting: Neurosurgery

## 2024-01-13 VITALS — BP 120/84 | Temp 97.8°F | Wt 170.0 lb

## 2024-01-13 DIAGNOSIS — D649 Anemia, unspecified: Secondary | ICD-10-CM | POA: Diagnosis not present

## 2024-01-13 DIAGNOSIS — K648 Other hemorrhoids: Secondary | ICD-10-CM

## 2024-01-13 DIAGNOSIS — E785 Hyperlipidemia, unspecified: Secondary | ICD-10-CM | POA: Diagnosis not present

## 2024-01-13 DIAGNOSIS — M1990 Unspecified osteoarthritis, unspecified site: Secondary | ICD-10-CM | POA: Diagnosis not present

## 2024-01-13 DIAGNOSIS — F419 Anxiety disorder, unspecified: Secondary | ICD-10-CM | POA: Diagnosis not present

## 2024-01-13 DIAGNOSIS — G970 Cerebrospinal fluid leak from spinal puncture: Secondary | ICD-10-CM | POA: Diagnosis not present

## 2024-01-13 DIAGNOSIS — M48062 Spinal stenosis, lumbar region with neurogenic claudication: Secondary | ICD-10-CM

## 2024-01-13 DIAGNOSIS — K602 Anal fissure, unspecified: Secondary | ICD-10-CM | POA: Diagnosis not present

## 2024-01-13 DIAGNOSIS — I10 Essential (primary) hypertension: Secondary | ICD-10-CM | POA: Diagnosis not present

## 2024-01-13 DIAGNOSIS — Z9889 Other specified postprocedural states: Secondary | ICD-10-CM

## 2024-01-13 MED ORDER — OXYCODONE HCL 5 MG PO TABS: 5.0000 mg | ORAL_TABLET | ORAL | 0 refills | Status: AC | PRN

## 2024-01-13 NOTE — Progress Notes (Signed)
   REFERRING PHYSICIAN:  Randeen Laine LABOR, Md [address] [place],  [place] [address]  DOS: 12/01/23   decompression L2-S1   HISTORY OF PRESENT ILLNESS: Sierra Sparks is  status post above surgery.  Unfortunately, she has been struggling with significant rectal pain since surgery.  She is also having trouble controlling her urine.  We have repeated her MRI scan which shows no findings that could explain her current symptoms.  She did have a urinary tract infection and was placed on treatment.    PHYSICAL EXAMINATION:  General: Patient is well developed, well nourished, calm, collected, and in no apparent distress.   NEUROLOGICAL:  General: In no acute distress.   Awake, alert, oriented to person, place, and time.  Pupils equal round and reactive to light.  Facial tone is symmetric.     Strength:           Side Iliopsoas Quads Hamstring PF DF EHL  R 5 5 5 5 5 5   L 5 5 5 5 5 5    Incision c/d/I    ROS (Neurologic):  Negative except as noted above  IMAGING: Nothing new to review.   ASSESSMENT/PLAN:  Sierra Sparks is doing fair s/p above surgery.  I have refilled her medications.  She will continue with physical therapy.  I will refer her to general surgery to evaluate her hemorrhoids.    I will contact one of the urologists to establish an appointment for her for evaluation for neurogenic bladder.  If she still has symptoms at her next appointment, we will discuss injections such as a caudal injection.  If she does not obtain relief in the medium term, we will consider spinal cord stimulator evaluation.     Reeves Daisy MD Department of neurosurgery

## 2024-01-14 ENCOUNTER — Ambulatory Visit: Admitting: Urology

## 2024-01-14 VITALS — BP 144/85 | HR 96 | Ht 61.0 in | Wt 170.0 lb

## 2024-01-14 DIAGNOSIS — N319 Neuromuscular dysfunction of bladder, unspecified: Secondary | ICD-10-CM

## 2024-01-14 NOTE — Progress Notes (Signed)
 01/14/24 10:48 AM   Elenor JONELLE Pouch December 17, 1940 [id number]  CC: Urinary incontinence  HPI: 83 year old female with no prior urologic symptoms who underwent decompression L2-S1 with neurosurgery on 12/01/2023.  This resolved her pain, however she has been incontinent of urine and stool since that time.  She was unable to give a urine sample for analysis today, bladder scan only .  She did have a UTI during her hospitalization treated with antibiotics.  She denies any dysuria.  She has no sensation of voiding and leaks throughout the day.  Denies any urgency, dysuria, gross hematuria, abdominal pain.  She was unable to void for urinalysis today, bladder scan normal .   PMH: Past Medical History:  Diagnosis Date   Allergic rhinitis, cause unspecified    Allergy    Anemia    Anxiety state, unspecified    Arthritis    Cataract    forming    Colon polyps    Depressive disorder, not elsewhere classified    Diffuse cystic mastopathy    Disorder of bone and cartilage, unspecified    Diverticulosis    External hemorrhoids    Gallstones    GERD (gastroesophageal reflux disease)    Glaucoma    narrow angle thatwas corrected with laser surgery    Osteoarthrosis, unspecified whether generalized or localized, unspecified site    hands   Osteoporosis    Pre-diabetes    Pure hypercholesterolemia    Thyroid  disease    years past- no meds currently    Unspecified essential hypertension    Unspecified hemorrhoids without mention of complication     Surgical History: Past Surgical History:  Procedure Laterality Date   BREAST BIOPSY Left 08/11/2020   11:00 3cmfn Q clip u/s bx papilloma   BREAST EXCISIONAL BIOPSY Left 08/25/2020   surgical exc papilloma with tag placement tag no.[id number]   BREAST LUMPECTOMY WITH RADIO FREQUENCY LOCALIZER Left 08/25/2020   Procedure: BREAST LUMPECTOMY WITH RADIO FREQUENCY LOCALIZER;  Surgeon: Tye Millet, DO;  Location: [facility];  Service:  General;  Laterality: Left;   BUNIONECTOMY Left 10/1999   CESAREAN SECTION  1981   CHOLECYSTECTOMY     COLONOSCOPY  01/2002   polyp; hemorrhoids;diverticulosis   COLONOSCOPY  2013   diverticulosis   DEXA  06/2000   osteopenia   DEXA  04/2003   Stable   HAND SURGERY Left    DIP fusion-left   HEMORRHOID SURGERY     LUMBAR LAMINECTOMY/DECOMPRESSION MICRODISCECTOMY N/A 12/01/2023   Procedure: LUMBAR LAMINECTOMY/DECOMPRESSION MICRODISCECTOMY 4 LEVEL;  Surgeon: Clois Fret, MD;  Location: [facility];  Service: Neurosurgery;  Laterality: N/A;  L2-S1 POSTERIOR SPINAL DECOMPRESSION   POLYPECTOMY     TONSILLECTOMY     childhood    Family History: Family History  Problem Relation Age of Onset   Stroke Mother    Coronary artery disease Mother 54   Hypertension Mother    Hyperlipidemia Mother    Arthritis Father    Lung cancer Father        + smoker   Hypertension Sister    Migraines Sister    Colon cancer Brother        ? primary colon   Liver cancer Brother    Cancer Brother        type unknown   Stroke Maternal Grandmother        hemorrhagic   Cancer Maternal Grandmother        female cancer type unknown   Bone cancer Paternal Uncle  Esophageal cancer Neg Hx    Rectal cancer Neg Hx    Stomach cancer Neg Hx    Breast cancer Neg Hx    Colon polyps Neg Hx     Social History:  reports that she has never smoked. She has never used smokeless tobacco. She reports current alcohol use of about 3.0 standard drinks of alcohol per week. She reports that she does not use drugs.  Physical Exam: BP (!) 144/85 (BP Location: Left Arm, Patient Position: Sitting, Cuff Size: Large)   Pulse 96   Ht 5' 1 (1.549 m)   Wt 170 lb (77.1 kg)   SpO2 94%   BMI 32.12 kg/m    Constitutional:  Alert and oriented, No acute distress. Cardiovascular: No clubbing, cyanosis, or edema. Respiratory: Normal respiratory effort, no increased work of breathing. GI: Abdomen is soft, nontender,  nondistended, no abdominal masses   Laboratory Data: Reviewed  Assessment & Plan:   83 year old female with urinary and fecal and dents without sensation after undergoing decompression of L2-S1 with neurosurgery in August 2025.  She had no urinary symptoms preop.  Emptying appropriately.  We discussed other options like a Foley catheter or suprapubic tube, as well as concept of neurogenic bladder and that it can take 3 to 12 months for return of function, and some patients may have permanent dysfunction.  She would like to avoid Foley catheter at this time, risks and return precautions were discussed.  RTC 3 months PVR, consider urodynamics in the future.  Would wait until at least March 2026 before pursuing urodynamics.  May benefit from visit with Dr. Gaston in the future as well  Redell Burnet, MD 01/14/2024  [facility] [address] [place], [place] [address] [phone]

## 2024-01-19 ENCOUNTER — Ambulatory Visit: Payer: Self-pay | Admitting: Family Medicine

## 2024-01-19 ENCOUNTER — Other Ambulatory Visit (INDEPENDENT_AMBULATORY_CARE_PROVIDER_SITE_OTHER)

## 2024-01-19 DIAGNOSIS — E876 Hypokalemia: Secondary | ICD-10-CM

## 2024-01-19 LAB — BASIC METABOLIC PANEL WITH GFR
BUN: 15 mg/dL (ref 6–23)
CO2: 25 meq/L (ref 19–32)
Calcium: 9.7 mg/dL (ref 8.4–10.5)
Chloride: 103 meq/L (ref 96–112)
Creatinine, Ser: 0.57 mg/dL (ref 0.40–1.20)
GFR: 85.41 mL/min (ref >60.00)
Glucose, Bld: 106 mg/dL — ABNORMAL HIGH (ref 70–99)
Potassium: 4.1 meq/L (ref 3.5–5.1)
Sodium: 139 meq/L (ref 135–145)

## 2024-01-26 ENCOUNTER — Ambulatory Visit: Admitting: Surgery

## 2024-01-26 ENCOUNTER — Encounter: Payer: Self-pay | Admitting: Surgery

## 2024-01-26 VITALS — BP 174/100 | HR 92 | Temp 98.3°F | Ht 61.0 in | Wt 171.0 lb

## 2024-01-26 DIAGNOSIS — K642 Third degree hemorrhoids: Secondary | ICD-10-CM

## 2024-01-26 DIAGNOSIS — K602 Anal fissure, unspecified: Secondary | ICD-10-CM | POA: Diagnosis not present

## 2024-01-26 NOTE — Patient Instructions (Signed)
 Your prescription for Nifedipine ointment has been called into [facility] in [place]. This is a compounded medication and they are the only pharmacy that does this. Insurance does not normally cover this medication and the cost is $56. Apply a small amount to your finger and place this just inside the rectum three times a day for 6 weeks.  The drug store will call you to let you know when this prescription is ready. [facility] is located at [address], [place], [place] [address] [facility]: [phone]    We will see you back in about 3-4 weeks

## 2024-01-27 ENCOUNTER — Encounter: Payer: Self-pay | Admitting: Urology

## 2024-01-27 NOTE — Progress Notes (Unsigned)
 Outpatient Surgical Follow Up  01/27/2024  Sierra Sparks is an 83 y.o. female.   Chief Complaint  Patient presents with   New Patient (Initial Visit)    Hemorrhoids     HPI: Sierra Sparks is a 83 y.o. female seen at the request of Dr. Katrina for rectal bleeding and pain.  On 8/18 she underwent L2-S1 lumbar decompression including central laminectomy and bilateral medial facetectomies including foraminotomies.  She has had issues with pain.  She reports having  intermittent bloody bm,  She also reports rectal pain Audrie intensity and sharp worsening when having bowel movements.  Prior history of hemorrhoids.  She underwent a CTA that have personally reviewed showing no evidence of active bleeding. She also has a type III large paraesophageal hernia, Sh does endorse reflux but no dysphagia, She has known of this hernia for a while.  She has on a stable hemoglobin. He also tells me that she has issues with urinary and fecal incontinence.  This seems to be a progression of her lumbar. She also reports that her back pain has significantly improved after a day back surgery I did see her couple months ago when she was in the hospital  Past Medical History:  Diagnosis Date   Allergic rhinitis, cause unspecified    Allergy    Anemia    Anxiety state, unspecified    Arthritis    Cataract    forming    Colon polyps    Depressive disorder, not elsewhere classified    Diffuse cystic mastopathy    Disorder of bone and cartilage, unspecified    Diverticulosis    External hemorrhoids    Gallstones    GERD (gastroesophageal reflux disease)    Glaucoma    narrow angle thatwas corrected with laser surgery    Osteoarthrosis, unspecified whether generalized or localized, unspecified site    hands   Osteoporosis    Pre-diabetes    Pure hypercholesterolemia    Thyroid  disease    years past- no meds currently    Unspecified essential hypertension    Unspecified hemorrhoids without  mention of complication     Past Surgical History:  Procedure Laterality Date   BREAST BIOPSY Left 08/11/2020   11:00 3cmfn Q clip u/s bx papilloma   BREAST EXCISIONAL BIOPSY Left 08/25/2020   surgical exc papilloma with tag placement tag no.[id number]   BREAST LUMPECTOMY WITH RADIO FREQUENCY LOCALIZER Left 08/25/2020   Procedure: BREAST LUMPECTOMY WITH RADIO FREQUENCY LOCALIZER;  Surgeon: Tye Millet, DO;  Location: [facility] OR[facility];  Service: General;  Laterality: Left;   BUNIONECTOMY Left 10/1999   CESAREAN SECTION  1981   CHOLECYSTECTOMY     COLONOSCOPY  01/2002   polyp; hemorrhoids;diverticulosis   COLONOSCOPY  2013   diverticulosis   DEXA  06/2000   osteopenia   DEXA  04/2003   Stable   HAND SURGERY Left    DIP fusion-left   HEMORRHOID SURGERY     LUMBAR LAMINECTOMY/DECOMPRESSION MICRODISCECTOMY N/A 12/01/2023   Procedure: LUMBAR LAMINECTOMY/DECOMPRESSION MICRODISCECTOMY 4 LEVEL;  Surgeon: Clois Fret, MD;  Location: [facility];  Service: Neurosurgery;  Laterality: N/A;  L2-S1 POSTERIOR SPINAL DECOMPRESSION   POLYPECTOMY     TONSILLECTOMY     childhood    Family History  Problem Relation Age of Onset   Stroke Mother    Coronary artery disease Mother 41   Hypertension Mother    Hyperlipidemia Mother    Arthritis Father    Lung cancer Father        +  smoker   Hypertension Sister    Migraines Sister    Colon cancer Brother        ? primary colon   Liver cancer Brother    Cancer Brother        type unknown   Stroke Maternal Grandmother        hemorrhagic   Cancer Maternal Grandmother        female cancer type unknown   Bone cancer Paternal Uncle    Esophageal cancer Neg Hx    Rectal cancer Neg Hx    Stomach cancer Neg Hx    Breast cancer Neg Hx    Colon polyps Neg Hx     Social History:  reports that she has never smoked. She has never used smokeless tobacco. She reports current alcohol use of about 3.0 standard drinks of alcohol per week. She reports that she  does not use drugs.  Allergies: No Known Allergies  Medications reviewed.    ROS Full ROS performed and is otherwise negative other than what is stated in HPI   BP (!) 174/100   Pulse 92   Temp 98.3 F (36.8 C) (Oral)   Ht 5' 1 (1.549 m)   Wt 171 lb (77.6 kg)   SpO2 95%   BMI 32.31 kg/m   Physical Exam CONSTITUTIONAL: She is in no acute distress.  Chaperone present. EYES: Pupils are equal, round, Sclera are non-icteric. EARS, NOSE, MOUTH AND THROAT: The oropharynx is clear. The oral mucosa is pink and moist. Hearing is intact to voice. LYMPH NODES:  Lymph nodes in the neck are normal. RESPIRATORY:  Lungs are clear. There is normal respiratory effort, with equal breath sounds bilaterally, and without pathologic use of accessory muscles. CARDIOVASCULAR: Heart is regular without murmurs, gallops, or rubs. GI: The abdomen is  soft, nontender, and nondistended. There are no palpable masses. There is no hepatosplenomegaly. There are normal bowel sounds in all quadrants. Rectal: There is evidence of noncomplicated internal hemorrhoids grade 3 but there is evidence of an anal fissure with tenderness to palpation of posterior midline.  No obvious masses.  No active bleeding no rectal masses, increased sphincter tone MUSCULOSKELETAL: Normal muscle strength and tone. No cyanosis or edema.   SKIN: Turgor is good and there are no pathologic skin lesions or ulcers. NEUROLOGIC: Motor and sensation is grossly normal. Cranial nerves are grossly intact. PSYCH:  Oriented to person, place and time. Affect is normal.   Assessment/Plan: 83 year old female with persistent anal fissure.  In addition to this she has hemorrhoids that I do not think are causing significant symptomatology.  Discussed with the patient and the husband in detail about her disease process.  I do think that I would like to address fissure first.  At this time she does not need hemorrhoidectomy.  Will start with medical  management of fissure including fiber, nifedipine cream and twice daily sitz bath's.  I will be happy to see her back in a few weeks.  Also discussed with her in detail about potential Botox injection and the role in anal fissures. I personally spent a total of 40 minutes in the care of the patient today including performing a medically appropriate exam/evaluation, counseling and educating, placing orders, referring and communicating with other health care professionals, documenting clinical information in the EHR, independently interpreting and reviewing images studies and coordinating care.   Laneta Luna, MD [facility] General Surgeon

## 2024-01-30 ENCOUNTER — Other Ambulatory Visit: Payer: Self-pay | Admitting: Family Medicine

## 2024-01-30 DIAGNOSIS — F418 Other specified anxiety disorders: Secondary | ICD-10-CM

## 2024-02-02 DIAGNOSIS — K602 Anal fissure, unspecified: Secondary | ICD-10-CM | POA: Diagnosis not present

## 2024-02-02 DIAGNOSIS — I10 Essential (primary) hypertension: Secondary | ICD-10-CM | POA: Diagnosis not present

## 2024-02-02 DIAGNOSIS — H8110 Benign paroxysmal vertigo, unspecified ear: Secondary | ICD-10-CM | POA: Diagnosis not present

## 2024-02-02 DIAGNOSIS — F419 Anxiety disorder, unspecified: Secondary | ICD-10-CM | POA: Diagnosis not present

## 2024-02-02 DIAGNOSIS — G970 Cerebrospinal fluid leak from spinal puncture: Secondary | ICD-10-CM | POA: Diagnosis not present

## 2024-02-02 DIAGNOSIS — D649 Anemia, unspecified: Secondary | ICD-10-CM | POA: Diagnosis not present

## 2024-02-02 DIAGNOSIS — K219 Gastro-esophageal reflux disease without esophagitis: Secondary | ICD-10-CM | POA: Diagnosis not present

## 2024-02-02 DIAGNOSIS — M1990 Unspecified osteoarthritis, unspecified site: Secondary | ICD-10-CM | POA: Diagnosis not present

## 2024-02-02 DIAGNOSIS — E785 Hyperlipidemia, unspecified: Secondary | ICD-10-CM | POA: Diagnosis not present

## 2024-02-13 DIAGNOSIS — E785 Hyperlipidemia, unspecified: Secondary | ICD-10-CM | POA: Diagnosis not present

## 2024-02-13 DIAGNOSIS — H8110 Benign paroxysmal vertigo, unspecified ear: Secondary | ICD-10-CM | POA: Diagnosis not present

## 2024-02-13 DIAGNOSIS — F419 Anxiety disorder, unspecified: Secondary | ICD-10-CM | POA: Diagnosis not present

## 2024-02-13 DIAGNOSIS — K219 Gastro-esophageal reflux disease without esophagitis: Secondary | ICD-10-CM | POA: Diagnosis not present

## 2024-02-13 DIAGNOSIS — D649 Anemia, unspecified: Secondary | ICD-10-CM | POA: Diagnosis not present

## 2024-02-13 DIAGNOSIS — M1990 Unspecified osteoarthritis, unspecified site: Secondary | ICD-10-CM | POA: Diagnosis not present

## 2024-02-13 DIAGNOSIS — K602 Anal fissure, unspecified: Secondary | ICD-10-CM | POA: Diagnosis not present

## 2024-02-13 DIAGNOSIS — I10 Essential (primary) hypertension: Secondary | ICD-10-CM | POA: Diagnosis not present

## 2024-02-16 NOTE — Progress Notes (Unsigned)
   REFERRING PHYSICIAN:  Randeen Laine LABOR, Md [address] [place],  [place] [address]  DOS: 12/01/23   decompression L2-S1   HISTORY OF PRESENT ILLNESS:  She continued with rectal pain and problems controlling her urine at her last visit. Repeat lumbar MRI looked okay.   She was referred to urology and general surgery at last visit.   She saw Dr. Jordis yesterday and has follow up in 2 weeks. She continues with rectal pain, incontinence, and diarrhea.   No LBP or leg pain. No numbness, tingling, or weakness in her legs.    PHYSICAL EXAMINATION:  General: Patient is well developed, well nourished, calm, collected, and in no apparent distress.   NEUROLOGICAL:  General: In no acute distress.   Awake, alert, oriented to person, place, and time.  Pupils equal round and reactive to light.  Facial tone is symmetric.    Strength:          Side Iliopsoas Quads Hamstring PF DF EHL  R 5 5 5 5 5 5   L 5 5 5 5 5 5    Incision well healed   ROS (Neurologic):  Negative except as noted above  IMAGING: Nothing new to review.   ASSESSMENT/PLAN:  Sierra Sparks is doing well in regards to back and leg pain s/p above surgery. She continues with rectal pain and incontinence.   Incontinence issues do not appear to be spine mediated.   Treatment options reviewed with patient and following plan made:   - She may slowly return to activity as tolerated regarding her back.  - She should continue to follow up with general surgery and urology.  - Will call her in 6 weeks to check on her.   Advised to contact the office if any questions or concerns arise.  Glade Boys PA-C Department of neurosurgery

## 2024-02-18 ENCOUNTER — Ambulatory Visit: Admitting: Surgery

## 2024-02-18 ENCOUNTER — Encounter: Payer: Self-pay | Admitting: Surgery

## 2024-02-18 VITALS — BP 151/94 | HR 92 | Temp 98.2°F | Ht 61.0 in | Wt 171.0 lb

## 2024-02-18 DIAGNOSIS — K602 Anal fissure, unspecified: Secondary | ICD-10-CM | POA: Diagnosis not present

## 2024-02-18 DIAGNOSIS — R197 Diarrhea, unspecified: Secondary | ICD-10-CM

## 2024-02-18 MED ORDER — LOPERAMIDE HCL 2 MG PO TABS: 4.0000 mg | ORAL_TABLET | Freq: Four times a day (QID) | ORAL | 0 refills | Status: DC | PRN

## 2024-02-18 NOTE — Progress Notes (Signed)
 Outpatient Surgical Follow Up  02/18/2024  Sierra Sparks is an 83 y.o. female.   Chief Complaint  Patient presents with   Follow-up    HPI: Sierra Sparks is a 83 y.o. female seen at the request of Dr. Katrina for rectal bleeding and pain.  On 8/18 she underwent L2-S1 lumbar decompression including central laminectomy and bilateral medial facetectomies including foraminotomies.  She has had issues with pain.  She reports having  intermittent bloody bm,  She also reports rectal pain moderate intensity and sharp worsening when having bowel movements.  Prior history of hemorrhoids.  She underwent a CTA that have personally reviewed showing no evidence of active bleeding. Now has significant diarrhea w incontinence  She also has a type III large paraesophageal hernia, Sh does endorse reflux but no dysphagia, She has known of this hernia for a while.  She has on a stable hemoglobin. SHe also tells me that she has issues with urinary and fecal incontinence.  This seems to be a progression of her lumbar. She also reports that her back pain has significantly improved after a day back surgery I did see her couple months ago when she was in the hospital    Past Medical History:  Diagnosis Date   Allergic rhinitis, cause unspecified    Allergy    Anemia    Anxiety state, unspecified    Arthritis    Cataract    forming    Colon polyps    Depressive disorder, not elsewhere classified    Diffuse cystic mastopathy    Disorder of bone and cartilage, unspecified    Diverticulosis    External hemorrhoids    Gallstones    GERD (gastroesophageal reflux disease)    Glaucoma    narrow angle thatwas corrected with laser surgery    Osteoarthrosis, unspecified whether generalized or localized, unspecified site    Sparks   Osteoporosis    Pre-diabetes    Pure hypercholesterolemia    Thyroid  disease    years past- no meds currently    Unspecified essential hypertension    Unspecified  hemorrhoids without mention of complication     Past Surgical History:  Procedure Laterality Date   BREAST BIOPSY Left 08/11/2020   11:00 3cmfn Q clip u/s bx papilloma   BREAST EXCISIONAL BIOPSY Left 08/25/2020   surgical exc papilloma with tag placement tag no.[id number]   BREAST LUMPECTOMY WITH RADIO FREQUENCY LOCALIZER Left 08/25/2020   Procedure: BREAST LUMPECTOMY WITH RADIO FREQUENCY LOCALIZER;  Surgeon: Tye Millet, DO;  Location: [facility] OR[facility];  Service: General;  Laterality: Left;   BUNIONECTOMY Left 10/1999   CESAREAN SECTION  1981   CHOLECYSTECTOMY     COLONOSCOPY  01/2002   polyp; hemorrhoids;diverticulosis   COLONOSCOPY  2013   diverticulosis   DEXA  06/2000   osteopenia   DEXA  04/2003   Stable   HAND SURGERY Left    DIP fusion-left   HEMORRHOID SURGERY     LUMBAR LAMINECTOMY/DECOMPRESSION MICRODISCECTOMY N/A 12/01/2023   Procedure: LUMBAR LAMINECTOMY/DECOMPRESSION MICRODISCECTOMY 4 LEVEL;  Surgeon: Clois Fret, MD;  Location: [facility] OR[facility];  Service: Neurosurgery;  Laterality: N/A;  L2-S1 POSTERIOR SPINAL DECOMPRESSION   POLYPECTOMY     TONSILLECTOMY     childhood    Family History  Problem Relation Age of Onset   Stroke Mother    Coronary artery disease Mother 7   Hypertension Mother    Hyperlipidemia Mother    Arthritis Father    Lung cancer Father        +  smoker   Hypertension Sister    Migraines Sister    Colon cancer Brother        ? primary colon   Liver cancer Brother    Cancer Brother        type unknown   Stroke Maternal Grandmother        hemorrhagic   Cancer Maternal Grandmother        female cancer type unknown   Bone cancer Paternal Uncle    Esophageal cancer Neg Hx    Rectal cancer Neg Hx    Stomach cancer Neg Hx    Breast cancer Neg Hx    Colon polyps Neg Hx     Social History:  reports that she has never smoked. She has never used smokeless tobacco. She reports current alcohol use of about 3.0 standard drinks of alcohol per week. She  reports that she does not use drugs.  Allergies: No Known Allergies  Medications reviewed.    ROS Full ROS performed and is otherwise negative other than what is stated in HPI   BP (!) 151/94   Pulse 92   Temp 98.2 F (36.8 C) (Oral)   Ht 5' 1 (1.549 m)   Wt 171 lb (77.6 kg)   SpO2 98%   BMI 32.31 kg/m   Physical Exam  CONSTITUTIONAL: She is in no acute distress.  Chaperone present. EYES: Pupils are equal, round, Sclera are non-icteric. EARS, NOSE, MOUTH AND THROAT: The oropharynx is clear. The oral mucosa is pink and moist. Hearing is intact to voice. LYMPH NODES:  Lymph nodes in the neck are normal. GI: The abdomen is  soft, nontender, and nondistended. There are no palpable masses. There is no hepatosplenomegaly. There are normal bowel sounds in all quadrants. Rectal: There is evidence of noncomplicated internal hemorrhoids grade 3 but there is evidence of an anal fissure with tenderness to palpation of posterior midline.  No obvious masses.  No active bleeding no rectal masses, increased sphincter tone MUSCULOSKELETAL: Normal muscle strength and tone. No cyanosis or edema.   SKIN: Turgor is good and there are no pathologic skin lesions or ulcers. NEUROLOGIC: Motor and sensation is grossly normal. Cranial nerves are grossly intact. PSYCH:  Oriented to person, place and time. Affect is normal.     Assessment/Plan: Anal fissure with significant diarrhea.  We will control diarrhea with Imodium.  Agree with C. difficile workup I would not recommend chemical sphincterotomy until we have a better control of her diarrhea and better assessment.  No need for urgent intervention I personally spent a total of 20 minutes in the care of the patient today including performing a medically appropriate exam/evaluation, counseling and educating, placing orders, referring and communicating with other health care professionals, documenting clinical information in the EHR, independently  interpreting and reviewing images studies and coordinating care.   Laneta Luna, MD [facility] General Surgeon

## 2024-02-18 NOTE — Patient Instructions (Addendum)
 We went a prescription to your pharmacy, you may pick that up anytime.   Continue using the Nifedipine cream     Diarrhea, Adult Diarrhea is when you pass loose and sometimes watery poop (stool) often. Diarrhea can make you feel weak and cause you to lose water in your body (get dehydrated). Losing water in your body can cause you to: Feel tired and thirsty. Have a dry mouth. Go pee (urinate) less often. Diarrhea often lasts 2-3 days. It can last longer if it is a sign of something more serious. Be sure to treat your diarrhea as told by your doctor. Follow these instructions at home: Eating and drinking     Follow these instructions as told by your doctor: Take an ORS (oral rehydration solution). This is a drink that helps you replace fluids and minerals your body lost. It is sold at pharmacies and stores. Drink enough fluid to keep your pee (urine) pale yellow. Drink fluids such as: Water. You can also get fluids by sucking on ice chips. Diluted fruit juice. Low-calorie sports drinks. Milk. Avoid drinking fluids that have a lot of sugar or caffeine  in them. These include soda, energy drinks, and regular sports drinks. Avoid alcohol. Eat bland, easy-to-digest foods in small amounts as you are able. These foods include: Bananas. Applesauce. Rice. Low-fat (lean) meats. Toast. Crackers. Avoid spicy or fatty foods.  Medicines Take over-the-counter and prescription medicines only as told by your doctor. If you were prescribed antibiotics, take them as told by your doctor. Do not stop taking them even if you start to feel better. General instructions  Wash your hands often using soap and water for 20 seconds. If soap and water are not available, use hand sanitizer. Others in your home should wash their hands as well. Wash your hands: After using the toilet or changing a diaper. Before preparing, cooking, or serving food. While caring for a sick person. While visiting someone  in a hospital. Rest at home while you get better. Take a warm bath to help with any burning or pain from having diarrhea. Watch your condition for any changes. Contact a doctor if: You have a fever. Your diarrhea gets worse. You have new symptoms. You vomit every time you eat or drink. You feel light-headed, dizzy, or you have a headache. You have muscle cramps. You have signs of losing too much water in your body, such as: Dark pee, very little pee, or no pee. Cracked lips. Dry mouth. Sunken eyes. Sleepiness. Weakness. You have bloody or black poop or poop that looks like tar. You have very bad pain, cramping, or bloating in your belly (abdomen). Your skin feels cold and clammy. You feel confused. Get help right away if: You have chest pain. Your heart is beating very quickly. You have trouble breathing or you are breathing very quickly. You feel very weak or you faint. These symptoms may be an emergency. Get help right away. Call 911. Do not wait to see if the symptoms will go away. Do not drive yourself to the hospital. This information is not intended to replace advice given to you by your health care provider. Make sure you discuss any questions you have with your health care provider. Document Revised: 09/18/2021 Document Reviewed: 09/18/2021 Elsevier Patient Education  2024 [employer].

## 2024-02-19 ENCOUNTER — Ambulatory Visit (INDEPENDENT_AMBULATORY_CARE_PROVIDER_SITE_OTHER): Admitting: Orthopedic Surgery

## 2024-02-19 ENCOUNTER — Encounter: Payer: Self-pay | Admitting: Orthopedic Surgery

## 2024-02-19 VITALS — BP 136/88 | Wt 173.2 lb

## 2024-02-19 DIAGNOSIS — Z9889 Other specified postprocedural states: Secondary | ICD-10-CM

## 2024-02-19 DIAGNOSIS — M48062 Spinal stenosis, lumbar region with neurogenic claudication: Secondary | ICD-10-CM

## 2024-03-03 ENCOUNTER — Ambulatory Visit: Admitting: Surgery

## 2024-03-03 ENCOUNTER — Encounter: Payer: Self-pay | Admitting: Surgery

## 2024-03-03 VITALS — BP 171/76 | HR 83 | Temp 98.2°F | Ht 61.0 in | Wt 171.2 lb

## 2024-03-03 DIAGNOSIS — K602 Anal fissure, unspecified: Secondary | ICD-10-CM | POA: Diagnosis not present

## 2024-03-03 DIAGNOSIS — R159 Full incontinence of feces: Secondary | ICD-10-CM

## 2024-03-03 NOTE — Patient Instructions (Signed)
 We have seen you today for an Anal Fissure. Please read the information below in regards to your diagnosis.   We will want you to begin doing Sitz Baths three times daily at the minimum and after every bowel movement. It is extremely important to keep this area as clean as possible. I have included instructions for how to take a sitz bath, below.  Please see your follow-up appointment below.   If you have any questions or concerns prior to this appointment, please call our office and speak with a nurse.  Anal Fissure, Adult An anal fissure is a small tear or crack in the skin around the anus. Bleeding from a fissure usually stops on its own within a few minutes. However, bleeding will often occur again with each bowel movement until the crack heals. What are the causes? This condition may be caused by: Passing large, hard stool (feces). Frequent diarrhea. Constipation. Inflammatory bowel disease (Crohn disease or ulcerative colitis). Infections. Anal sex.  What are the signs or symptoms? Symptoms of this condition include: Bleeding from the rectum. Small amounts of blood seen on your stool, on toilet paper, or in the toilet after a bowel movement. Painful bowel movements. Itching or irritation around the anus.  How is this diagnosed? A health care provider may diagnose this condition by closely examining the anal area. An anal fissure can usually be seen with careful inspection. In some cases, a rectal exam may be performed, or a short tube (anoscope) may be used to examine the anal canal. How is this treated? Treatment for this condition may include: Taking steps to avoid constipation. This may include making changes to your diet, such as increasing your intake of fiber or fluid. Taking fiber supplements. These supplements can soften your stool to help make bowel movements easier. Your health care provider may also prescribe a stool softener if your stool is often hard. Taking sitz  baths. This may help to heal the tear. Using medicated creams or ointments. These may be prescribed to lessen discomfort.  Follow these instructions at home: Eating and drinking Avoid foods that may be constipating, such as bananas and dairy products. Drink enough fluid to keep your urine clear or pale yellow. Maintain a diet that is high in fruits, whole grains, and vegetables. General instructions Keep the anal area as clean and dry as possible. Take sitz baths as told by your health care provider. Do not use soap in the sitz baths. Take over-the-counter and prescription medicines only as told by your health care provider. Use creams or ointments only as told by your health care provider. Keep all follow-up visits as told by your health care provider. This is important. Contact a health care provider if: You have more bleeding. You have a fever. You have diarrhea that is mixed with blood. You continue to have pain. Your problem is getting worse rather than better. This information is not intended to replace advice given to you by your health care provider. Make sure you discuss any questions you have with your health care provider. Document Released: 04/01/2005 Document Revised: 08/09/2015 Document Reviewed: 06/27/2014 Elsevier Interactive Patient Education  2018 [employer].   How to Take a [employer] A sitz bath is a warm water bath that is taken while you are sitting down. The water should only come up to your hips and should cover your buttocks. Your health care provider may recommend a sitz bath to help you: Clean the lower part of your  body, including your genital area. With itching. With pain. With sore muscles or muscles that tighten or spasm.  How to take a sitz bath Take 3-4 sitz baths per day or as told by your health care provider. Partially fill a bathtub with warm water. You will only need the water to be deep enough to cover your hips and buttocks when you are  sitting in it. If your health care provider told you to put medicine in the water, follow the directions exactly. Sit in the water and open the tub drain a little. Turn on the warm water again to keep the tub at the correct level. Keep the water running constantly. Soak in the water for 15-20 minutes or as told by your health care provider. After the sitz bath, pat the affected area dry first. Do not rub it. Be careful when you stand up after the sitz bath because you may feel dizzy.  Contact a health care provider if: Your symptoms get worse. Do not continue with sitz baths if your symptoms get worse. You have new symptoms. Do not continue with sitz baths until you talk with your health care provider. This information is not intended to replace advice given to you by your health care provider. Make sure you discuss any questions you have with your health care provider. Document Released: 12/23/2003 Document Revised: 08/30/2015 Document Reviewed: 03/30/2014 Elsevier Interactive Patient Education  [employer].

## 2024-03-03 NOTE — Progress Notes (Signed)
 Outpatient Surgical Follow Up  03/03/2024  Sierra Sparks is an 83 y.o. female.   Chief Complaint  Patient presents with   Follow-up    Anal fissure    HPI: Sierra Sparks is a 83 y.o. female seen in F/U for rectal bleeding and pain.  On 8/18 she underwent L2-S1 lumbar decompression including central laminectomy and bilateral medial facetectomies including foraminotomies.  She has had issues with pain.  She reports having  intermittent bloody bm,  She also reports rectal pain moderate intensity and sharp worsening when having bowel movements.  Prior history of hemorrhoids.  She underwent a CTA that have personally reviewed showing no evidence of active bleeding. Now has significant diarrhea w incontinence  She also has a type III large paraesophageal hernia, Sh does endorse reflux but no dysphagia, She has known of this hernia for a while.  She has on a stable hemoglobin. SHe also tells me that she has issues with urinary and fecal incontinence.  This seems to be a progression of her lumbar. She also reports that her back pain has significantly improved after a day back surgery He has gone to need to have perirectal pain.  More importantly she is more concerned about fecal incontinence.  We did have a good discussion regarding potential loop colostomy for quality-of-life improvement./We talked about potentially about doing this electively and this in theory might help with the fissure as well.  She seems to have significant quality-of-life issues related to incontinence  Past Medical History:  Diagnosis Date   Allergic rhinitis, cause unspecified    Allergy    Anemia    Anxiety state, unspecified    Arthritis    Cataract    forming    Colon polyps    Depressive disorder, not elsewhere classified    Diffuse cystic mastopathy    Disorder of bone and cartilage, unspecified    Diverticulosis    External hemorrhoids    Gallstones    GERD (gastroesophageal reflux disease)     Glaucoma    narrow angle thatwas corrected with laser surgery    Osteoarthrosis, unspecified whether generalized or localized, unspecified site    hands   Osteoporosis    Pre-diabetes    Pure hypercholesterolemia    Thyroid  disease    years past- no meds currently    Unspecified essential hypertension    Unspecified hemorrhoids without mention of complication     Past Surgical History:  Procedure Laterality Date   BREAST BIOPSY Left 08/11/2020   11:00 3cmfn Q clip u/s bx papilloma   BREAST EXCISIONAL BIOPSY Left 08/25/2020   surgical exc papilloma with tag placement tag no.[id number]   BREAST LUMPECTOMY WITH RADIO FREQUENCY LOCALIZER Left 08/25/2020   Procedure: BREAST LUMPECTOMY WITH RADIO FREQUENCY LOCALIZER;  Surgeon: Tye Millet, DO;  Location: [facility] OR[facility];  Service: General;  Laterality: Left;   BUNIONECTOMY Left 10/1999   CESAREAN SECTION  1981   CHOLECYSTECTOMY     COLONOSCOPY  01/2002   polyp; hemorrhoids;diverticulosis   COLONOSCOPY  2013   diverticulosis   DEXA  06/2000   osteopenia   DEXA  04/2003   Stable   HAND SURGERY Left    DIP fusion-left   HEMORRHOID SURGERY     LUMBAR LAMINECTOMY/DECOMPRESSION MICRODISCECTOMY N/A 12/01/2023   Procedure: LUMBAR LAMINECTOMY/DECOMPRESSION MICRODISCECTOMY 4 LEVEL;  Surgeon: Clois Fret, MD;  Location: [facility];  Service: Neurosurgery;  Laterality: N/A;  L2-S1 POSTERIOR SPINAL DECOMPRESSION   POLYPECTOMY     TONSILLECTOMY  childhood    Family History  Problem Relation Age of Onset   Stroke Mother    Coronary artery disease Mother 64   Hypertension Mother    Hyperlipidemia Mother    Arthritis Father    Lung cancer Father        + smoker   Hypertension Sister    Migraines Sister    Colon cancer Brother        ? primary colon   Liver cancer Brother    Cancer Brother        type unknown   Stroke Maternal Grandmother        hemorrhagic   Cancer Maternal Grandmother        female cancer type unknown   Bone  cancer Paternal Uncle    Esophageal cancer Neg Hx    Rectal cancer Neg Hx    Stomach cancer Neg Hx    Breast cancer Neg Hx    Colon polyps Neg Hx     Social History:  reports that she has never smoked. She has never used smokeless tobacco. She reports current alcohol use of about 3.0 standard drinks of alcohol per week. She reports that she does not use drugs.  Allergies: No Known Allergies  Medications reviewed.    ROS Full ROS performed and is otherwise negative other than what is stated in HPI   BP (!) 171/76   Pulse 83   Temp 98.2 F (36.8 C) (Oral)   Ht 5' 1 (1.549 m)   Wt 171 lb 3.2 oz (77.7 kg)   SpO2 95%   BMI 32.35 kg/m   Physical Exam  CONSTITUTIONAL: She is in no acute distress.  Chaperone present. EYES: Pupils are equal, round, Sclera are non-icteric. EARS, NOSE, MOUTH AND THROAT: The oropharynx is clear. The oral mucosa is pink and moist. Hearing is intact to voice. LYMPH NODES:  Lymph nodes in the neck are normal. GI: The abdomen is  soft, nontender, and nondistended. There are no palpable masses. There is no hepatosplenomegaly. There are normal bowel sounds in all quadrants. Rectal: There is evidence of noncomplicated internal hemorrhoids grade 3 but there is evidence of an anal fissure with tenderness to palpation of posterior midline.  No obvious masses.  No active bleeding no rectal masses, increased sphincter tone MUSCULOSKELETAL: Normal muscle strength and tone. No cyanosis or edema.   SKIN: Turgor is good and there are no pathologic skin lesions or ulcers. NEUROLOGIC: Motor and sensation is grossly normal. Cranial nerves are grossly intact. PSYCH:  Oriented to person, place and time. Affect is normal.   Assessment/Plan: Anal fissure as well as chronic incontinence.  Discussed with the patient and the family in detail.  Currently they are more concerned about incontinence that anything else.  They were asking about potential role of colostomy.  I do  think that it can be an option but they need to think about it in more detail.  They want to come back in about a month and they want think potentially about a loop colostomy if symptoms continue after 1 month.  I do think that is very reasonable.  Very difficult situation without good answers I personally spent a total of 40 minutes in the care of the patient today including performing a medically appropriate exam/evaluation, counseling and educating, placing orders, referring and communicating with other health care professionals, documenting clinical information in the EHR, independently interpreting and reviewing images studies and coordinating care.   Laneta Luna, MD FACS  General Surgeon

## 2024-03-19 ENCOUNTER — Encounter: Payer: Self-pay | Admitting: Pharmacist

## 2024-03-19 NOTE — Progress Notes (Signed)
 Pharmacy Quality Measure Review  This patient is appearing on a report for being at risk of failing the adherence measure for cholesterol (statin) medications this calendar year.   Medication: rosuvastatin  5 mg Last fill date: 11/26/23 for 90 day supply  Contacted pharmacy to facilitate refills.

## 2024-03-30 ENCOUNTER — Other Ambulatory Visit: Payer: Self-pay | Admitting: Podiatry

## 2024-04-05 ENCOUNTER — Telehealth: Payer: Self-pay | Admitting: Orthopedic Surgery

## 2024-04-05 ENCOUNTER — Encounter: Payer: Self-pay | Admitting: Surgery

## 2024-04-05 ENCOUNTER — Ambulatory Visit: Admitting: Surgery

## 2024-04-05 VITALS — BP 150/80 | HR 86 | Ht 61.0 in | Wt 172.0 lb

## 2024-04-05 DIAGNOSIS — R159 Full incontinence of feces: Secondary | ICD-10-CM | POA: Diagnosis not present

## 2024-04-05 MED ORDER — BISACODYL 5 MG PO TBEC: DELAYED_RELEASE_TABLET | ORAL | 0 refills | Status: DC

## 2024-04-05 MED ORDER — METRONIDAZOLE 500 MG PO TABS: ORAL_TABLET | ORAL | 0 refills | Status: DC

## 2024-04-05 MED ORDER — POLYETHYLENE GLYCOL 3350 17 GM/SCOOP PO POWD: ORAL | 0 refills | Status: DC

## 2024-04-05 MED ORDER — NEOMYCIN SULFATE 500 MG PO TABS: ORAL_TABLET | ORAL | 0 refills | Status: DC

## 2024-04-05 NOTE — Telephone Encounter (Signed)
 Okay.  Thanks.

## 2024-04-05 NOTE — Patient Instructions (Signed)
 We have discussed a portion of your colon and creating an ostomy through 4 small incisions today. We will schedule this surgery at [facility] with Dr. Jordis. Please plan a hospital stay of 3-7 days for surgery and recovery time.  To prep for your surgery, You will need to complete a bowel prep and take 2 antibiotics. Your antibiotics are Neomycin  and Metronidazole  and these will be taken at 8am, 2pm, 8pm on the day of your prep. (Please see the Colon Surgery Prep sheet provided)  You have also been given a (Blue) Pre-Care Sheet with more information regarding your particular surgery. You will get several calls from [facility] before surgery.   You will need to arrange to be out of work for approximately 2 weeks and then you may return with a lifting restriction for 4 more weeks. If you have FMLA or Disability paperwork that needs to be filled out, please have your company fax your paperwork to [phone] or you may drop this by either office. This paperwork will be filled out within 3 days after your surgery has been completed.  Please call our office with any questions or concerns prior to your scheduled surgery.   Laparoscopic Ostomy   Colostomy Surgery, Adult  A colostomy is a surgical procedure that is done to attach part of the colon to the front of the abdomen (abdominal wall). The colon, also called the large intestine, is the last part of the digestive tract. It is where water is absorbed from digested food to form stool (feces). A colostomy is done to redirect stool through an opening (stoma) in the abdominal wall. You may need this surgery if you have a medical condition that affects how stool leaves your body through the usual opening (rectum). A bag will be attached to the stoma on the outside of your body. This bag will collect the stool and waste that are redirected through the stoma. A colostomy may be temporary or permanent. Tell a health care provider about: Any allergies you  have. All medicines you are taking, including vitamins, herbs, eye drops, creams, and over-the-counter medicines. Any problems you or family members have had with anesthetic medicines. Any bleeding problems you have. Any surgeries you have had. Any medical conditions you have and any treatments you are receiving. Whether you are pregnant or may be pregnant. What are the risks? Generally, this is a safe procedure. However, problems may occur, including: Infection. Bleeding. Allergic reactions to medicines. Damage to nearby structures or organs. Leaking of stool inside the abdomen. Formation of scar tissue that causes a blockage. What happens before the procedure?  Medicines Ask your health care provider about: Changing or stopping your regular medicines. This is especially important if you are taking diabetes medicines or blood thinners. Taking medicines such as aspirin and ibuprofen. These medicines can thin your blood. Do not take these medicines unless your health care provider tells you to take them. Taking over-the-counter medicines, vitamins, herbs, and supplements. General instructions You may have an exam or testing. Do not use any products that contain nicotine or tobacco for at least 4 weeks before the procedure. These products include cigarettes, chewing tobacco, and vaping devices, such as e-cigarettes. If you need help quitting, ask your health care provider. Ask your health care provider what steps will be taken to help prevent infection. These may include: Removing hair at the surgery site. Washing skin with a germ-killing soap. Receiving antibiotic medicine. The health care team will examine and mark  your abdomen to determine the best place to create the stoma on your abdomen. What happens during the procedure? An IV will be inserted into one of your veins. A drainage tube may be passed through your nose and into your stomach (nasogastric tube or NG tube). You may be  given: A medicine to help you relax (sedative). A medicine to make you fall asleep (general anesthetic). An incision will be made in the front of your abdomen. The muscles under the skin will be divided or separated. The next steps will vary depending on the type of colostomy. There are two main types: End colostomy. Part of the colon will be removed, or the colon will be divided into two separate parts. The end of the colon that is attached to the upper part of the digestive tract will be attached to the wall of the abdomen, creating a stoma. The other end will be closed off. Loop colostomy. A part of the colon will be pulled through the incision in the abdomen. An opening will be made in the side of the colon. The colon will be attached to the skin at this opening, creating the stoma. Waste will come from the part of the colon above the stoma. This will allow the rest of the colon below the stoma to rest and heal. Stitches (sutures) will be used to create the stoma and close the incision. A colostomy bag will be placed around the stoma to collect stool and mucus. The procedure may vary among health care providers and hospitals. What happens after the procedure? Your blood pressure, heart rate, breathing rate, and blood oxygen level will be monitored until you leave the hospital. You will be given pain medicine as needed. You will receive fluids and nutrition through an IV. As soon as you are eating well and passing stool through the colostomy, the IV may be removed. Do not drive until your health care provider approves. While you are in the hospital, your health care team will teach you how to care for your colostomy at home. A home care team may continue to help you learn this after discharge from the hospital. Summary Colostomy surgery is done to attach part of the colon, also called the large intestine, to the front of the abdomen so that stool can be redirected through an opening (stoma) in  the abdominal wall. A colostomy may be temporary or permanent. Before the procedure, follow instructions from your health care provider about taking medicines and about eating and drinking. During surgery, a colostomy bag will be placed around the stoma to collect stool and mucus. While you are in the hospital, your health care team will begin to teach you how to care for your colostomy at home. This information is not intended to replace advice given to you by your health care provider. Make sure you discuss any questions you have with your health care provider. Document Revised: 11/22/2020 Document Reviewed: 11/22/2020 Elsevier Patient Education  2024 [employer] Laparoscopic Colectomy, Care After This sheet gives you information about how to care for yourself after your procedure. Your health care provider may also give you more specific instructions. If you have problems or questions, contact your health care provider. What can I expect after the procedure? After your procedure, it is common to have the following: Pain in your abdomen, especially in the incision areas. You will be given medicine to control the pain. Tiredness. This is a normal part of the recovery process. Your energy level  will return to normal over the next several weeks. Changes in your bowel movements, such as constipation or needing to go more often. Talk with your health care provider about how to manage this.  Follow these instructions at home: Medicines Take over-the-counter and prescription medicines only as told by your health care provider. Do not drive or use heavy machinery while taking prescription pain medicine. Do not drink alcohol while taking prescription pain medicine. If you were prescribed an antibiotic medicine, use it as told by your health care provider. Do not stop using the antibiotic even if you start to feel better. Incision care Follow instructions from your health care provider about how to  take care of your incision areas. Make sure you: Keep your incisions clean and dry. Wash your hands with soap and water before and after applying medicine to the areas, and before and after changing your bandage (dressing). If soap and water are not available, use hand sanitizer. Change your dressing as told by your health care provider. Leave stitches (sutures), skin glue, or adhesive strips in place. These skin closures may need to stay in place for 2 weeks or longer. If adhesive strip edges start to loosen and curl up, you may trim the loose edges. Do not remove adhesive strips completely unless your health care provider tells you to do that. Do not wear tight clothing over the incisions. Tight clothing may rub and irritate the incision areas, which may cause the incisions to open. Do not take baths, swim, or use a hot tub until your health care provider approves. Ask your health care provider if you can take showers. You may only be allowed to take sponge baths for bathing. Check your incision area every day for signs of infection. Check for: More redness, swelling, or pain. More fluid or blood. Warmth. Pus or a bad smell. Activity Avoid lifting anything that is heavier than 10 lb (4.5 kg) for 2 weeks or until your health care provider says it is okay. You may resume normal activities as told by your health care provider. Ask your health care provider what activities are safe for you. Take rest breaks during the day as needed. Eating and drinking Follow instructions from your health care provider about what you can eat after surgery. To prevent or treat constipation while you are taking prescription pain medicine, your health care provider may recommend that you: Drink enough fluid to keep your urine clear or pale yellow. Take over-the-counter or prescription medicines. Eat foods that are high in fiber, such as fresh fruits and vegetables, whole grains, and beans. Limit foods that are high  in fat and processed sugars, such as fried and sweet foods. General instructions Ask your health care provider when you will need an appointment to get your sutures or staples removed. Keep all follow-up visits as told by your health care provider. This is important. Contact a health care provider if: You have more redness, swelling, or pain around your incisions. You have more fluid or blood coming from the incisions. Your incisions feel warm to the touch. You have pus or a bad smell coming from your incisions or your dressing. You have a fever. You have an incision that breaks open (edges not staying together) after sutures or staples have been removed. Get help right away if: You develop a rash. You have chest pain or difficulty breathing. You have pain or swelling in your legs. You feel light-headed or you faint. Your abdomen swells (becomes distended).  You have nausea or vomiting. You have blood in your stool (feces). This information is not intended to replace advice given to you by your health care provider. Make sure you discuss any questions you have with your health care provider. Document Released: 10/19/2004 Document Revised: 01/01/2016 Document Reviewed: 01/01/2016 Elsevier Interactive Patient Education  [employer].

## 2024-04-05 NOTE — Telephone Encounter (Signed)
 Regarding her back, she was doing well when I saw her in December. She was seeing general surgery for rectal pain.   Please call to see how she is doing with her back and let me know.   Thanks.

## 2024-04-06 ENCOUNTER — Encounter: Payer: Self-pay | Admitting: Surgery

## 2024-04-06 NOTE — Progress Notes (Signed)
 Outpatient Surgical Follow Up    Sierra Sparks is an 83 y.o. female.   Chief Complaint  Patient presents with   Follow-up    HPI: Sierra Sparks is a 83 y.o. female seen in F/U for rectal bleeding and pain.  On 8/18 she underwent L2-S1 lumbar decompression including central laminectomy and bilateral medial facetectomies including foraminotomies.  She has had issues with pain.  She reports having  intermittent bloody bm,  She also reports rectal pain moderate intensity and sharp worsening when having bowel movements.  Prior history of hemorrhoids.  She underwent a CTA that have personally reviewed showing no evidence of active bleeding. Now has significant diarrhea w incontinence  She also has a type III large paraesophageal hernia, Sh does endorse reflux but no dysphagia, She has known of this hernia for a while.  She has on a stable hemoglobin. SHe has issues with urinary and fecal incontinence.  This seems to be a progression. SHe has gone to need to have perirectal pain.  More importantly she is more concerned about fecal incontinence.  We did have a good discussion regarding potential loop colostomy for quality-of-life improvement./We talked about potentially about doing this electively and this in theory might help with the fissure as well.  She seems to have significant quality-of-life issues related to incontinence  Past Medical History:  Diagnosis Date   Allergic rhinitis, cause unspecified    Allergy    Anemia    Anxiety state, unspecified    Arthritis    Cataract    forming    Colon polyps    Depressive disorder, not elsewhere classified    Diffuse cystic mastopathy    Disorder of bone and cartilage, unspecified    Diverticulosis    External hemorrhoids    Gallstones    GERD (gastroesophageal reflux disease)    Glaucoma    narrow angle thatwas corrected with laser surgery    Osteoarthrosis, unspecified whether generalized or localized, unspecified site    hands    Osteoporosis    Pre-diabetes    Pure hypercholesterolemia    Thyroid  disease    years past- no meds currently    Unspecified essential hypertension    Unspecified hemorrhoids without mention of complication     Past Surgical History:  Procedure Laterality Date   BREAST BIOPSY Left 08/11/2020   11:00 3cmfn Q clip u/s bx papilloma   BREAST EXCISIONAL BIOPSY Left 08/25/2020   surgical exc papilloma with tag placement tag no.[id number]   BREAST LUMPECTOMY WITH RADIO FREQUENCY LOCALIZER Left 08/25/2020   Procedure: BREAST LUMPECTOMY WITH RADIO FREQUENCY LOCALIZER;  Surgeon: Tye Millet, DO;  Location: [facility] ORS;  Service: General;  Laterality: Left;   BUNIONECTOMY Left 10/1999   CESAREAN SECTION  1981   CHOLECYSTECTOMY     COLONOSCOPY  01/2002   polyp; hemorrhoids;diverticulosis   COLONOSCOPY  2013   diverticulosis   DEXA  06/2000   osteopenia   DEXA  04/2003   Stable   HAND SURGERY Left    DIP fusion-left   HEMORRHOID SURGERY     LUMBAR LAMINECTOMY/DECOMPRESSION MICRODISCECTOMY N/A 12/01/2023   Procedure: LUMBAR LAMINECTOMY/DECOMPRESSION MICRODISCECTOMY 4 LEVEL;  Surgeon: Clois Fret, MD;  Location: [facility] ORS;  Service: Neurosurgery;  Laterality: N/A;  L2-S1 POSTERIOR SPINAL DECOMPRESSION   POLYPECTOMY     TONSILLECTOMY     childhood    Family History  Problem Relation Age of Onset   Stroke Mother    Coronary artery disease Mother 53  Hypertension Mother    Hyperlipidemia Mother    Arthritis Father    Lung cancer Father        + smoker   Hypertension Sister    Migraines Sister    Colon cancer Brother        ? primary colon   Liver cancer Brother    Cancer Brother        type unknown   Stroke Maternal Grandmother        hemorrhagic   Cancer Maternal Grandmother        female cancer type unknown   Bone cancer Paternal Uncle    Esophageal cancer Neg Hx    Rectal cancer Neg Hx    Stomach cancer Neg Hx    Breast cancer Neg Hx    Colon polyps Neg Hx     Social  History:  reports that she has never smoked. She has never been exposed to tobacco smoke. She has never used smokeless tobacco. She reports current alcohol use of about 3.0 standard drinks of alcohol per week. She reports that she does not use drugs.  Allergies: Allergies[1]  Medications reviewed.    ROS Full ROS performed and is otherwise negative other than what is stated in HPI   BP (!) 150/80   Pulse 86   Ht 5' 1 (1.549 m)   Wt 172 lb (78 kg)   SpO2 97%   BMI 32.50 kg/m   Physical Exam   CONSTITUTIONAL: She is in no acute distress.  Chaperone present. EYES: Pupils are equal, round, Sclera are non-icteric. EARS, NOSE, MOUTH AND THROAT: The oropharynx is clear. The oral mucosa is pink and moist. Hearing is intact to voice. LYMPH NODES:  Lymph nodes in the neck are normal. GI: The abdomen is  soft, nontender, and nondistended. There are no palpable masses. There is no hepatosplenomegaly. There are normal bowel sounds in all quadrants. MUSCULOSKELETAL: Normal muscle strength and tone. No cyanosis or edema.   SKIN: Turgor is good and there are no pathologic skin lesions or ulcers. NEUROLOGIC: Motor and sensation is grossly normal. Cranial nerves are grossly intact. PSYCH:  Oriented to person, place and time. Affect is normal.   Assessment/Plan: 83 year old female with persistent fecal incontinence.  I had another Extensive discussion with the patient and her husband. She is to the point that she is not having any quality of life and having severe anxiety due to persistent fecal incontinence.  Discussed again the potential option for a loop sigmoid colostomy to improve quality of life. She is to the point that she either have a colostomy done continue to have fecal incontinence. I do think that we can do this robotically.  Procedure discussed with the patient in detail.  The risk the benefits and the possible complications including but not limited to: Bleeding, infection bowel  injury, pain she understands and wished to proceed I personally spent a total of 40 minutes in the care of the patient today including performing a medically appropriate exam/evaluation, counseling and educating, placing orders, referring and communicating with other health care professionals, documenting clinical information in the EHR, independently interpreting and reviewing images studies and coordinating care.   Laneta Luna, MD FACS General Surgeon     [1] No Known Allergies

## 2024-04-14 ENCOUNTER — Telehealth: Payer: Self-pay | Admitting: Surgery

## 2024-04-14 NOTE — Telephone Encounter (Signed)
 Patient has been advised of Pre-Admission date/time, and Surgery date at [facility].  Surgery Date: 05/06/24 Preadmission Testing Date: 04/28/24 in person visit @ 10:30 with preadmissions.   Patient given surgery information and prep at time of office visit.   Patient has been made aware to call [phone], between 1-3:00pm the day before surgery, to find out what time to arrive for surgery.

## 2024-04-16 ENCOUNTER — Ambulatory Visit: Admitting: Urology

## 2024-04-19 NOTE — Progress Notes (Deleted)
 "    04/20/2024 2:25 PM   Sierra Sparks 01/24/1941 [id number]  Referring provider: Randeen Laine LABOR, MD [address] [place],  [place] [address]  Urological history: 1. Urinary incontinence - Occurred after decompression of L2 to S1 (11/2023)   No chief complaint on file.  HPI: Sierra Sparks is a 84 y.o. woman who presents today for three month follow up.   Previous records reviewed.  She has been experiencing urinary and fecal incontinence since undergoing a decompression of L2-S1 in August 2025.  They are having (1 to 7) or (8 or more) daytime voids,  they are having nocturia (1-2) or (3 or more) and urgency is (none, mild, strong, severe).   They are having (stress, urge or mixed incontinence.)    they are having urinary leakage (1-2 times weekly, 3 or more times weekly, 1-2 times daily and 3 or more times daily) They are using absorbent products for leakage (no, sometimes, always )   the type of products they use are (panty liners, absorbant pads, depends) *** daily.  They are not limiting fluids.  They are not engaging in toilet mapping  ***  UA ***  PVR ***  Serum creatinine (01/2024) 0.57  Hemoglobin A1c (11/2023) 6.0  OAB agent ***  Diuretics ***   Fluid consumption ***   PMH: Past Medical History:  Diagnosis Date   Allergic rhinitis, cause unspecified    Allergy    Anemia    Anxiety state, unspecified    Arthritis    Cataract    forming    Colon polyps    Depressive disorder, not elsewhere classified    Diffuse cystic mastopathy    Disorder of bone and cartilage, unspecified    Diverticulosis    External hemorrhoids    Gallstones    GERD (gastroesophageal reflux disease)    Glaucoma    narrow angle thatwas corrected with laser surgery    Osteoarthrosis, unspecified whether generalized or localized, unspecified site    hands   Osteoporosis    Pre-diabetes    Pure hypercholesterolemia    Thyroid  disease    years past- no meds  currently    Unspecified essential hypertension    Unspecified hemorrhoids without mention of complication     Surgical History: Past Surgical History:  Procedure Laterality Date   BREAST BIOPSY Left 08/11/2020   11:00 3cmfn Q clip u/s bx papilloma   BREAST EXCISIONAL BIOPSY Left 08/25/2020   surgical exc papilloma with tag placement tag no.[id number]   BREAST LUMPECTOMY WITH RADIO FREQUENCY LOCALIZER Left 08/25/2020   Procedure: BREAST LUMPECTOMY WITH RADIO FREQUENCY LOCALIZER;  Surgeon: Tye Millet, DO;  Location: [facility] ORS;  Service: General;  Laterality: Left;   BUNIONECTOMY Left 10/1999   CESAREAN SECTION  1981   CHOLECYSTECTOMY     COLONOSCOPY  01/2002   polyp; hemorrhoids;diverticulosis   COLONOSCOPY  2013   diverticulosis   DEXA  06/2000   osteopenia   DEXA  04/2003   Stable   HAND SURGERY Left    DIP fusion-left   HEMORRHOID SURGERY     LUMBAR LAMINECTOMY/DECOMPRESSION MICRODISCECTOMY N/A 12/01/2023   Procedure: LUMBAR LAMINECTOMY/DECOMPRESSION MICRODISCECTOMY 4 LEVEL;  Surgeon: Clois Fret, MD;  Location: [facility] ORS;  Service: Neurosurgery;  Laterality: N/A;  L2-S1 POSTERIOR SPINAL DECOMPRESSION   POLYPECTOMY     TONSILLECTOMY     childhood    Home Medications:  Allergies as of 04/20/2024   No Known Allergies  Medication List        Accurate as of April 19, 2024  2:25 PM. If you have any questions, ask your nurse or doctor.          acetaminophen  500 MG tablet Commonly known as: TYLENOL  Take 1,000 mg by mouth in the morning.   amLODipine  5 MG tablet Commonly known as: NORVASC  Take 1 tablet by mouth once daily   bisacodyl  5 MG EC tablet Commonly known as: DULCOLAX Take all 4 tablets at 8 am the morning prior to your surgery.   Calcium  1200 1200-1000 MG-UNIT Chew Chew 600 mg by mouth 2 (two) times daily.   ezetimibe  10 MG tablet Commonly known as: ZETIA  Take 1 tablet by mouth once daily   famotidine -calcium  carbonate-magnesium  hydroxide  10-800-165 MG chewable tablet Commonly known as: PEPCID  COMPLETE Chew 1 tablet by mouth daily as needed.   Ferrex 150 150 MG capsule Generic drug: iron  polysaccharides Take 1 capsule by mouth twice daily   FLUoxetine  20 MG capsule Commonly known as: PROZAC  Take 1 capsule by mouth once daily   fluticasone  50 MCG/ACT nasal spray Commonly known as: FLONASE  Place 2 sprays into both nostrils daily.   meclizine  25 MG tablet Commonly known as: ANTIVERT  Take 1 tablet (25 mg total) by mouth 3 (three) times daily as needed for dizziness.   meloxicam  7.5 MG tablet Commonly known as: MOBIC  Take 1 tablet by mouth once daily   metroNIDAZOLE  500 MG tablet Commonly known as: FLAGYL  Take 2 tablets at 8AM, [facility], and take 2 tablets at 8PM the day prior to your surgery   multivitamin with minerals Tabs tablet Take 1 tablet by mouth in the morning.   neomycin  500 MG tablet Commonly known as: MYCIFRADIN  Take 2 tablet at 8am, take 2 tablets at 2pm, and take 2 tablets at 8pm the day prior to your surgery   polyethylene glycol powder 17 GM/SCOOP powder Commonly known as: MiraLax  Mix full container in 64 ounces of Gatorade or other clear liquid. NO Red   rosuvastatin  5 MG tablet Commonly known as: CRESTOR  Take 1 tablet by mouth once daily   Vitamin C 500 MG Caps Take 500 mg by mouth in the morning.   vitamin D3 25 MCG tablet Commonly known as: CHOLECALCIFEROL Take 1,000 Units by mouth in the morning.        Allergies: Allergies[1]  Family History: Family History  Problem Relation Age of Onset   Stroke Mother    Coronary artery disease Mother 34   Hypertension Mother    Hyperlipidemia Mother    Arthritis Father    Lung cancer Father        + smoker   Hypertension Sister    Migraines Sister    Colon cancer Brother        ? primary colon   Liver cancer Brother    Cancer Brother        type unknown   Stroke Maternal Grandmother        hemorrhagic   Cancer  Maternal Grandmother        female cancer type unknown   Bone cancer Paternal Uncle    Esophageal cancer Neg Hx    Rectal cancer Neg Hx    Stomach cancer Neg Hx    Breast cancer Neg Hx    Colon polyps Neg Hx     Social History:  reports that she has never smoked. She has never been exposed to tobacco smoke. She  has never used smokeless tobacco. She reports current alcohol use of about 3.0 standard drinks of alcohol per week. She reports that she does not use drugs.  ROS: Pertinent ROS in HPI  Physical Exam: There were no vitals taken for this visit.  Constitutional:  Well nourished. Alert and oriented, No acute distress. HEENT: [place] AT, moist mucus membranes.  Trachea midline, no masses. Cardiovascular: No clubbing, cyanosis, or edema. Respiratory: Normal respiratory effort, no increased work of breathing. GU: No CVA tenderness.  No bladder fullness or masses.  Recession of labia minora, dry, pale vulvar vaginal mucosa and loss of mucosal ridges and folds.  Normal urethral meatus, no lesions, no prolapse, no discharge.   No urethral masses, tenderness and/or tenderness. No bladder fullness, tenderness or masses. *** vagina mucosa, *** estrogen effect, no discharge, no lesions, *** pelvic support, *** cystocele and *** rectocele noted.  No cervical motion tenderness.  Uterus is freely mobile and non-fixed.  No adnexal/parametria masses or tenderness noted.  Anus and perineum are without rashes or lesions.   ***  Neurologic: Grossly intact, no focal deficits, moving all 4 extremities. Psychiatric: Normal mood and affect.    Laboratory Data: See Epic and HPI   I have reviewed the labs.   Pertinent Imaging: ***  Assessment & Plan:  ***  1. Incontinence - ***  No follow-ups on file.  These notes generated with voice recognition software. I apologize for typographical errors.  Sierra CORNWALL, PA-C  [facility] [address]  [address] [place], [place] [address] [phone]     [1] No Known Allergies  "

## 2024-04-20 ENCOUNTER — Ambulatory Visit: Admitting: Urology

## 2024-04-28 ENCOUNTER — Encounter
Admission: RE | Admit: 2024-04-28 | Discharge: 2024-04-28 | Disposition: A | Discharge status: Home / Self Care | Source: Ambulatory Visit | Attending: Surgery | Admitting: Surgery

## 2024-04-28 ENCOUNTER — Other Ambulatory Visit: Payer: Self-pay

## 2024-04-28 VITALS — BP 165/85 | HR 81 | Temp 98.1°F | Resp 18 | Ht 61.0 in | Wt 171.0 lb

## 2024-04-28 DIAGNOSIS — Z01812 Encounter for preprocedural laboratory examination: Secondary | ICD-10-CM | POA: Insufficient documentation

## 2024-04-28 LAB — TYPE AND SCREEN
ABO/RH(D): O POS
Antibody Screen: NEGATIVE

## 2024-04-28 NOTE — Consult Note (Addendum)
 WOC Nurse requested for preoperative stoma site marking (colostomy), was given verbal consent by patient to place markings on R and  L side of abd for surgery. Reviewed with patient and CG ostomy care needs, resources, ostomy outpatient clinic, products and skin needs, to use black marker if sites begin to fade and film dressing comes off after showering- since surgical date is next week, answered all the good questions the patient had and patient felt the session was very helpful..   Discussed surgical procedure and stoma creation with patient.  Explained role of the WOC nurse team. Provided the patient with educational booklet and provided samples of pouching options in box.  Patients large pannus was examined sitting and standing in order to place the marking in the patient's visual field, away from any creases or abdominal contour issues and within the rectus muscle.  Attempted to mark below the patient's belt line and avoided lower natural crease across umbilicus that could create pouching adherence issues.   Marked for colostomy in the LLQ  ___5_ cm to the left of the umbilicus and __2__cm above the umbilicus.  Marked for ileostomy in the RLQ  ___5_cm to the right of the umbilicus and  ___2_ cm above the umbilicus.   Patient's abdomen cleansed with CHG wipes at site markings, allowed to air dry prior to marking. Covered mark with thin film transparent dressing to preserve marks until date of surgery- patient verbalized understanding of this and that these markings are recommendations only to the surgeon.   Sherrilyn Hals MSN RN CWOCN [facility]  [phone] (Available from 7-3 pm Mon-Friday)

## 2024-04-28 NOTE — H&P (View-Only) (Signed)
 WOC Nurse requested for preoperative stoma site marking (colostomy), was given verbal consent by patient to place markings on R and  L side of abd for surgery. Reviewed with patient and CG ostomy care needs, resources, ostomy outpatient clinic, products and skin needs, to use black marker if sites begin to fade and film dressing comes off after showering- since surgical date is next week, answered all the good questions the patient had and patient felt the session was very helpful..   Discussed surgical procedure and stoma creation with patient.  Explained role of the WOC nurse team. Provided the patient with educational booklet and provided samples of pouching options in box.  Patients large pannus was examined sitting and standing in order to place the marking in the patient's visual field, away from any creases or abdominal contour issues and within the rectus muscle.  Attempted to mark below the patient's belt line and avoided lower natural crease across umbilicus that could create pouching adherence issues.   Marked for colostomy in the LLQ  ___5_ cm to the left of the umbilicus and __2__cm above the umbilicus.  Marked for ileostomy in the RLQ  ___5_cm to the right of the umbilicus and  ___2_ cm above the umbilicus.   Patient's abdomen cleansed with CHG wipes at site markings, allowed to air dry prior to marking. Covered mark with thin film transparent dressing to preserve marks until date of surgery- patient verbalized understanding of this and that these markings are recommendations only to the surgeon.   Sherrilyn Hals MSN RN CWOCN [facility]  [phone] (Available from 7-3 pm Mon-Friday)

## 2024-04-28 NOTE — Patient Instructions (Addendum)
 Your procedure is scheduled on:  THURSDAY  JANUARY 22 Report to the Registration Desk on the 1st floor of the [facility] To find out your arrival time, please call [phone] between 1PM - 3PM on:   [facility]  JANUARY 21 If your arrival time is 6:00 am, do not arrive before that time as the [facility] entrance doors do not open until 6:00 am.  REMEMBER: Instructions that are not followed completely may result in serious medical risk, up to and including death; or upon the discretion of your surgeon and anesthesiologist your surgery may need to be rescheduled.  Do not eat food after midnight the night before surgery.  No gum chewing or hard candies.   One week prior to surgery: Stop Anti-inflammatories (NSAIDS) such as Advil, Aleve, Ibuprofen, Motrin, Naproxen, Naprosyn and Aspirin based products such as Excedrin, Goody's Powder, BC Powder. Stop ANY OVER THE COUNTER supplements until after surgery. Ascorbic Acid (VITAMIN C)  Calcium  Carbonate-Vit D-Min  fluticasone  (FLONASE )  Multiple Vitamin (MULTIVITAMIN WITH MINERALS)  vitamin D3 (CHOLECALCIFEROL)   You may however, continue to take Tylenol  if needed for pain up until the day of surgery. meloxicam  (MOBIC ) hold 7 days prior to surgery, last dose [facility] JANUARY 14  Continue taking all of your other prescription medications up until the day of surgery.  ON THE DAY OF SURGERY ONLY TAKE THESE MEDICATIONS WITH SIPS OF WATER:  FLUoxetine  (PROZAC )   Bowel prep as directed.  No Alcohol for 24 hours before or after surgery.  Do not use any recreational drugs for at least a week (preferably 2 weeks) before your surgery.  Please be advised that the combination of cocaine and anesthesia may have negative outcomes, up to and including death. If you test positive for cocaine, your surgery will be cancelled.  On the morning of surgery brush your teeth with toothpaste and water, you may rinse your mouth with mouthwash if you  wish. Do not swallow any toothpaste or mouthwash.  Use  Antibacterial soap the night prior to surgery.    Do not wear jewelry, make-up, hairpins, clips or nail polish.  For welded (permanent) jewelry: bracelets, anklets, waist bands, etc.  Please have this removed prior to surgery.  If it is not removed, there is a chance that hospital personnel will need to cut it off on the day of surgery.  Do not wear lotions, powders, or perfumes.   Do not shave body hair from the neck down 48 hours before surgery.  Do not bring valuables to the hospital. [facility] is not responsible for any missing/lost belongings or valuables.   Notify your doctor if there is any change in your medical condition (cold, fever, infection).  Wear comfortable clothing (specific to your surgery type) to the hospital.  After surgery, you can help prevent lung complications by doing breathing exercises.  Take deep breaths and cough every 1-2 hours.    If you are being admitted to the hospital overnight, leave your suitcase in the car. After surgery it may be brought to your room.  In case of increased patient census, it may be necessary for you, the patient, to continue your postoperative care in the Same Day Surgery department.  If you are being discharged the day of surgery, you will not be allowed to drive home. You will need a responsible individual to drive you home and stay with you for 24 hours after surgery.   If you are taking public transportation, you will need  to have a responsible individual with you.  Please call the Pre-admissions Testing Dept. at [phone] if you have any questions about these instructions.  Surgery Visitation Policy:  Patients having surgery or a procedure may have two visitors.  Children under the age of 17 must have an adult with them who is not the patient.  Inpatient Visitation:    Visiting hours are 7 a.m. to 8 p.m. Up to four visitors are allowed at one time in a  patient room. The visitors may rotate out with other people during the day.  One visitor age 51 or older may stay with the patient overnight and must be in the room by 8 p.m.   Merchandiser, Retail to address health-related social needs:  [email or web address]

## 2024-05-06 ENCOUNTER — Encounter: Payer: Self-pay | Admitting: Surgery

## 2024-05-06 ENCOUNTER — Inpatient Hospital Stay: Admitting: Anesthesiology

## 2024-05-06 ENCOUNTER — Inpatient Hospital Stay
Admission: RE | Admit: 2024-05-06 | Discharge: 2024-05-07 | DRG: 330 | Disposition: A | Discharge status: Home / Self Care | Attending: Surgery | Admitting: Surgery

## 2024-05-06 ENCOUNTER — Other Ambulatory Visit: Payer: Self-pay

## 2024-05-06 ENCOUNTER — Encounter
Admission: RE | Disposition: A | Discharge status: Home / Self Care | Payer: Self-pay | Source: Home / Self Care | Attending: Surgery

## 2024-05-06 DIAGNOSIS — K219 Gastro-esophageal reflux disease without esophagitis: Secondary | ICD-10-CM | POA: Diagnosis present

## 2024-05-06 DIAGNOSIS — R159 Full incontinence of feces: Principal | ICD-10-CM | POA: Diagnosis present

## 2024-05-06 DIAGNOSIS — Z939 Artificial opening status, unspecified: Principal | ICD-10-CM

## 2024-05-06 DIAGNOSIS — Z860100 Personal history of colon polyps, unspecified: Secondary | ICD-10-CM

## 2024-05-06 DIAGNOSIS — Z91048 Other nonmedicinal substance allergy status: Secondary | ICD-10-CM

## 2024-05-06 DIAGNOSIS — M810 Age-related osteoporosis without current pathological fracture: Secondary | ICD-10-CM | POA: Diagnosis present

## 2024-05-06 DIAGNOSIS — R32 Unspecified urinary incontinence: Secondary | ICD-10-CM | POA: Diagnosis present

## 2024-05-06 DIAGNOSIS — R197 Diarrhea, unspecified: Secondary | ICD-10-CM | POA: Diagnosis present

## 2024-05-06 DIAGNOSIS — N736 Female pelvic peritoneal adhesions (postinfective): Secondary | ICD-10-CM | POA: Diagnosis present

## 2024-05-06 DIAGNOSIS — Z8249 Family history of ischemic heart disease and other diseases of the circulatory system: Secondary | ICD-10-CM

## 2024-05-06 DIAGNOSIS — E7800 Pure hypercholesterolemia, unspecified: Secondary | ICD-10-CM | POA: Diagnosis present

## 2024-05-06 DIAGNOSIS — K625 Hemorrhage of anus and rectum: Secondary | ICD-10-CM | POA: Diagnosis present

## 2024-05-06 DIAGNOSIS — Z9049 Acquired absence of other specified parts of digestive tract: Secondary | ICD-10-CM

## 2024-05-06 DIAGNOSIS — I10 Essential (primary) hypertension: Secondary | ICD-10-CM | POA: Diagnosis present

## 2024-05-06 MED ORDER — OXYCODONE HCL 5 MG/5ML PO SOLN: 5.0000 mg | Freq: Once | ORAL | Status: DC | PRN

## 2024-05-06 MED ORDER — MELATONIN 5 MG PO TABS
5.0000 mg | ORAL_TABLET | Freq: Every evening | ORAL | Status: DC | PRN
  Administered 2024-05-06: 5 mg via ORAL
  Filled 2024-05-06: qty 1

## 2024-05-06 MED ORDER — ORAL CARE MOUTH RINSE: 15.0000 mL | Freq: Once | OROMUCOSAL | Status: AC

## 2024-05-06 MED ORDER — DIPHENHYDRAMINE HCL 50 MG/ML IJ SOLN: 12.5000 mg | Freq: Four times a day (QID) | INTRAMUSCULAR | Status: DC | PRN

## 2024-05-06 MED ORDER — 0.9 % SODIUM CHLORIDE (POUR BTL) OPTIME
TOPICAL | Status: DC | PRN
  Administered 2024-05-06: 500 mL

## 2024-05-06 MED ORDER — SODIUM CHLORIDE 0.9 % IV SOLN
2.0000 g | Freq: Two times a day (BID) | INTRAVENOUS | Status: AC
  Administered 2024-05-06 – 2024-05-07 (×2): 2 g via INTRAVENOUS
  Filled 2024-05-06 (×3): qty 2

## 2024-05-06 MED ORDER — PANTOPRAZOLE SODIUM 40 MG IV SOLR
40.0000 mg | Freq: Two times a day (BID) | INTRAVENOUS | Status: DC
  Administered 2024-05-06 – 2024-05-07 (×2): 40 mg via INTRAVENOUS
  Filled 2024-05-06 (×2): qty 10

## 2024-05-06 MED ORDER — ACETAMINOPHEN 10 MG/ML IV SOLN
INTRAVENOUS | Status: AC
  Filled 2024-05-06: qty 100

## 2024-05-06 MED ORDER — SUCCINYLCHOLINE CHLORIDE 200 MG/10ML IV SOSY
PREFILLED_SYRINGE | INTRAVENOUS | Status: AC
  Filled 2024-05-06: qty 10

## 2024-05-06 MED ORDER — METHOCARBAMOL 1000 MG/10ML IJ SOLN: 500.0000 mg | Freq: Three times a day (TID) | INTRAMUSCULAR | Status: DC | PRN

## 2024-05-06 MED ORDER — SUGAMMADEX SODIUM 200 MG/2ML IV SOLN
INTRAVENOUS | Status: DC | PRN
  Administered 2024-05-06: 200 mg via INTRAVENOUS

## 2024-05-06 MED ORDER — FAMOTIDINE-CA CARB-MAG HYDROX 10-800-165 MG PO CHEW: 1.0000 | CHEWABLE_TABLET | Freq: Every day | ORAL | Status: DC | PRN

## 2024-05-06 MED ORDER — SEVOFLURANE IN SOLN
RESPIRATORY_TRACT | Status: AC
  Filled 2024-05-06: qty 250

## 2024-05-06 MED ORDER — PROCHLORPERAZINE EDISYLATE 10 MG/2ML IJ SOLN: 5.0000 mg | Freq: Four times a day (QID) | INTRAMUSCULAR | Status: DC | PRN

## 2024-05-06 MED ORDER — PHENYLEPHRINE 80 MCG/ML (10ML) SYRINGE FOR IV PUSH (FOR BLOOD PRESSURE SUPPORT)
PREFILLED_SYRINGE | INTRAVENOUS | Status: DC | PRN
  Administered 2024-05-06 (×4): 80 ug via INTRAVENOUS

## 2024-05-06 MED ORDER — CHLORHEXIDINE GLUCONATE CLOTH 2 % EX PADS: 6.0000 | MEDICATED_PAD | Freq: Once | CUTANEOUS | Status: DC

## 2024-05-06 MED ORDER — CHLORHEXIDINE GLUCONATE 0.12 % MT SOLN
OROMUCOSAL | Status: AC
  Filled 2024-05-06: qty 15

## 2024-05-06 MED ORDER — ACETAMINOPHEN 500 MG PO TABS
ORAL_TABLET | ORAL | Status: AC
  Filled 2024-05-06: qty 2

## 2024-05-06 MED ORDER — CELECOXIB 200 MG PO CAPS
200.0000 mg | ORAL_CAPSULE | ORAL | Status: AC
  Administered 2024-05-06: 200 mg via ORAL

## 2024-05-06 MED ORDER — ENOXAPARIN SODIUM 40 MG/0.4ML IJ SOSY
40.0000 mg | PREFILLED_SYRINGE | INTRAMUSCULAR | Status: DC
  Administered 2024-05-07: 40 mg via SUBCUTANEOUS
  Filled 2024-05-06: qty 0.4

## 2024-05-06 MED ORDER — DEXAMETHASONE SOD PHOSPHATE PF 10 MG/ML IJ SOLN
INTRAMUSCULAR | Status: DC | PRN
  Administered 2024-05-06: 10 mg via INTRAVENOUS

## 2024-05-06 MED ORDER — FLUTICASONE PROPIONATE 50 MCG/ACT NA SUSP
2.0000 | Freq: Every day | NASAL | Status: DC
  Filled 2024-05-06: qty 16

## 2024-05-06 MED ORDER — METHOCARBAMOL 500 MG PO TABS: 500.0000 mg | ORAL_TABLET | Freq: Three times a day (TID) | ORAL | Status: DC | PRN

## 2024-05-06 MED ORDER — BUPIVACAINE-EPINEPHRINE (PF) 0.25% -1:200000 IJ SOLN
INTRAMUSCULAR | Status: AC
  Filled 2024-05-06: qty 30

## 2024-05-06 MED ORDER — AMLODIPINE BESYLATE 5 MG PO TABS
5.0000 mg | ORAL_TABLET | Freq: Every day | ORAL | Status: DC
  Administered 2024-05-07: 5 mg via ORAL
  Filled 2024-05-06: qty 1

## 2024-05-06 MED ORDER — MECLIZINE HCL 25 MG PO TABS: 25.0000 mg | ORAL_TABLET | Freq: Three times a day (TID) | ORAL | Status: DC | PRN

## 2024-05-06 MED ORDER — ONDANSETRON 4 MG PO TBDP: 4.0000 mg | ORAL_TABLET | Freq: Four times a day (QID) | ORAL | Status: DC | PRN

## 2024-05-06 MED ORDER — LIDOCAINE HCL (PF) 2 % IJ SOLN
INTRAMUSCULAR | Status: AC
  Filled 2024-05-06: qty 15

## 2024-05-06 MED ORDER — KETOROLAC TROMETHAMINE 15 MG/ML IJ SOLN
15.0000 mg | Freq: Four times a day (QID) | INTRAMUSCULAR | Status: DC
  Administered 2024-05-06 – 2024-05-07 (×3): 15 mg via INTRAVENOUS
  Filled 2024-05-06 (×3): qty 1

## 2024-05-06 MED ORDER — SODIUM CHLORIDE 0.9 % IV SOLN
2.0000 g | INTRAVENOUS | Status: AC
  Administered 2024-05-06: 2 g via INTRAVENOUS

## 2024-05-06 MED ORDER — LIDOCAINE HCL (PF) 2 % IJ SOLN
INTRAMUSCULAR | Status: DC | PRN
  Administered 2024-05-06: 80 mg via INTRADERMAL
  Administered 2024-05-06: 20 mg via INTRADERMAL

## 2024-05-06 MED ORDER — CHLORHEXIDINE GLUCONATE 0.12 % MT SOLN
15.0000 mL | Freq: Once | OROMUCOSAL | Status: AC
  Administered 2024-05-06: 15 mL via OROMUCOSAL

## 2024-05-06 MED ORDER — SODIUM CHLORIDE 0.9 % IV SOLN
INTRAVENOUS | Status: AC
  Filled 2024-05-06: qty 2

## 2024-05-06 MED ORDER — PROPOFOL 10 MG/ML IV BOLUS
INTRAVENOUS | Status: DC | PRN
  Administered 2024-05-06: 120 mg via INTRAVENOUS
  Administered 2024-05-06: 30 mg via INTRAVENOUS

## 2024-05-06 MED ORDER — FLUOXETINE HCL 20 MG PO CAPS
20.0000 mg | ORAL_CAPSULE | Freq: Every day | ORAL | Status: DC
  Administered 2024-05-07: 20 mg via ORAL
  Filled 2024-05-06: qty 1

## 2024-05-06 MED ORDER — DEXAMETHASONE SOD PHOSPHATE PF 10 MG/ML IJ SOLN
INTRAMUSCULAR | Status: AC
  Filled 2024-05-06: qty 3

## 2024-05-06 MED ORDER — FENTANYL CITRATE (PF) 100 MCG/2ML IJ SOLN
INTRAMUSCULAR | Status: DC | PRN
  Administered 2024-05-06 (×3): 50 ug via INTRAVENOUS

## 2024-05-06 MED ORDER — FENTANYL CITRATE (PF) 100 MCG/2ML IJ SOLN
INTRAMUSCULAR | Status: AC
  Filled 2024-05-06: qty 2

## 2024-05-06 MED ORDER — ONDANSETRON HCL 4 MG/2ML IJ SOLN
INTRAMUSCULAR | Status: AC
  Filled 2024-05-06: qty 6

## 2024-05-06 MED ORDER — EPHEDRINE SULFATE-NACL 50-0.9 MG/10ML-% IV SOSY
PREFILLED_SYRINGE | INTRAVENOUS | Status: DC | PRN
  Administered 2024-05-06: 5 mg via INTRAVENOUS
  Administered 2024-05-06: 2.5 mg via INTRAVENOUS
  Administered 2024-05-06: 5 mg via INTRAVENOUS

## 2024-05-06 MED ORDER — ACETAMINOPHEN 500 MG PO TABS
1000.0000 mg | ORAL_TABLET | ORAL | Status: AC
  Administered 2024-05-06: 1000 mg via ORAL

## 2024-05-06 MED ORDER — OXYCODONE HCL 5 MG PO TABS
5.0000 mg | ORAL_TABLET | ORAL | Status: DC | PRN
  Administered 2024-05-06: 10 mg via ORAL
  Administered 2024-05-07 (×2): 5 mg via ORAL
  Filled 2024-05-06: qty 2
  Filled 2024-05-06 (×2): qty 1

## 2024-05-06 MED ORDER — PROPOFOL 10 MG/ML IV BOLUS
INTRAVENOUS | Status: AC
  Filled 2024-05-06: qty 20

## 2024-05-06 MED ORDER — GABAPENTIN 300 MG PO CAPS
300.0000 mg | ORAL_CAPSULE | ORAL | Status: AC
  Administered 2024-05-06: 300 mg via ORAL

## 2024-05-06 MED ORDER — ONDANSETRON HCL 4 MG/2ML IJ SOLN: 4.0000 mg | Freq: Once | INTRAMUSCULAR | Status: DC | PRN

## 2024-05-06 MED ORDER — SIMETHICONE 80 MG PO CHEW: 40.0000 mg | CHEWABLE_TABLET | Freq: Four times a day (QID) | ORAL | Status: DC | PRN

## 2024-05-06 MED ORDER — ACETAMINOPHEN 500 MG PO TABS
1000.0000 mg | ORAL_TABLET | Freq: Four times a day (QID) | ORAL | Status: DC
  Administered 2024-05-06 – 2024-05-07 (×4): 1000 mg via ORAL
  Filled 2024-05-06 (×4): qty 2

## 2024-05-06 MED ORDER — ROCURONIUM BROMIDE 10 MG/ML (PF) SYRINGE
PREFILLED_SYRINGE | INTRAVENOUS | Status: AC
  Filled 2024-05-06: qty 40

## 2024-05-06 MED ORDER — MORPHINE SULFATE (PF) 2 MG/ML IV SOLN: 2.0000 mg | INTRAVENOUS | Status: DC | PRN

## 2024-05-06 MED ORDER — ONDANSETRON HCL 4 MG/2ML IJ SOLN: 4.0000 mg | Freq: Four times a day (QID) | INTRAMUSCULAR | Status: DC | PRN

## 2024-05-06 MED ORDER — LACTATED RINGERS IV SOLN: INTRAVENOUS | Status: DC

## 2024-05-06 MED ORDER — DIPHENHYDRAMINE HCL 50 MG/ML IJ SOLN
INTRAMUSCULAR | Status: DC | PRN
  Administered 2024-05-06: 25 mg via INTRAVENOUS

## 2024-05-06 MED ORDER — ONDANSETRON HCL 4 MG/2ML IJ SOLN
INTRAMUSCULAR | Status: DC | PRN
  Administered 2024-05-06: 4 mg via INTRAVENOUS

## 2024-05-06 MED ORDER — OXYCODONE HCL 5 MG PO TABS: 5.0000 mg | ORAL_TABLET | Freq: Once | ORAL | Status: DC | PRN

## 2024-05-06 MED ORDER — PROCHLORPERAZINE MALEATE 10 MG PO TABS: 10.0000 mg | ORAL_TABLET | Freq: Four times a day (QID) | ORAL | Status: DC | PRN

## 2024-05-06 MED ORDER — CHLORHEXIDINE GLUCONATE CLOTH 2 % EX PADS
6.0000 | MEDICATED_PAD | Freq: Once | CUTANEOUS | Status: AC
  Administered 2024-05-06: 6 via TOPICAL

## 2024-05-06 MED ORDER — SODIUM CHLORIDE 0.9 % IV SOLN: 2.0000 g | INTRAVENOUS | Status: DC

## 2024-05-06 MED ORDER — GABAPENTIN 300 MG PO CAPS
ORAL_CAPSULE | ORAL | Status: AC
  Filled 2024-05-06: qty 1

## 2024-05-06 MED ORDER — BUPIVACAINE-EPINEPHRINE (PF) 0.25% -1:200000 IJ SOLN
INTRAMUSCULAR | Status: DC | PRN
  Administered 2024-05-06: 30 mL via PERINEURAL

## 2024-05-06 MED ORDER — SODIUM CHLORIDE 0.9 % IV SOLN: INTRAVENOUS | Status: DC

## 2024-05-06 MED ORDER — ROCURONIUM BROMIDE 100 MG/10ML IV SOLN
INTRAVENOUS | Status: DC | PRN
  Administered 2024-05-06: 30 mg via INTRAVENOUS
  Administered 2024-05-06: 50 mg via INTRAVENOUS

## 2024-05-06 MED ORDER — DIPHENHYDRAMINE HCL 12.5 MG/5ML PO ELIX: 12.5000 mg | ORAL_SOLUTION | Freq: Four times a day (QID) | ORAL | Status: DC | PRN

## 2024-05-06 MED ORDER — FENTANYL CITRATE (PF) 100 MCG/2ML IJ SOLN: 25.0000 ug | INTRAMUSCULAR | Status: DC | PRN

## 2024-05-06 MED ORDER — CELECOXIB 200 MG PO CAPS
ORAL_CAPSULE | ORAL | Status: AC
  Filled 2024-05-06: qty 1

## 2024-05-06 NOTE — Plan of Care (Signed)

## 2024-05-06 NOTE — Transfer of Care (Signed)
 Immediate Anesthesia Transfer of Care Note  Patient: [name]  Procedure(s) Performed: CREATION, COLOSTOMY, ROBOT-ASSISTED  Patient Location: PACU  Anesthesia Type:General  Level of Consciousness: drowsy  Airway & Oxygen Therapy: Patient Spontanous Breathing and Patient connected to face mask oxygen  Post-op Assessment: Report given to RN and Post -op Vital signs reviewed and stable  Post vital signs: Reviewed and stable  Last Vitals:  Vitals Value Taken Time  BP 167/86 05/06/24 15:27  Temp    Pulse 84 05/06/24 15:29  Resp 16 05/06/24 15:29  SpO2 94 % 05/06/24 15:29  Vitals shown include unfiled device data.  Last Pain:  Vitals:   05/06/24 1049  TempSrc: Temporal  PainSc: 0-No pain         Complications: No notable events documented.

## 2024-05-06 NOTE — Op Note (Signed)
 PROCEDURES: 1. Robotic assisted Laparoscopic loop sigmoid colostomy  Pre-operative Diagnosis:Recalcitrant incontinence  Post-operative Diagnosis: same  Surgeon: Laneta FALCON Argelia Formisano   Anesthesia: General endotracheal anesthesia  ASA Class: 2   Surgeon: Laneta Luna , MD FACS  Anesthesia: Gen. with endotracheal tube   Findings: Adhesions from the sigmoid to the pelvic wall Loop sigmoid colostomy without tension   Estimated Blood Loss: 10cc                Complications: none         Condition: stable  Procedure Details  The patient was seen again in the Holding Room. The benefits, complications, treatment options, and expected outcomes were discussed with the patient. The risks of bleeding, infection, recurrence of symptoms, failure to resolve symptoms, anastomotic leak, bowel injury, any of which could require further surgery were reviewed with the patient.   The patient was taken to Operating Room, identified  and the procedure verified.  A Time Out was held and the above information confirmed.  Prior to the induction of general anesthesia, antibiotic prophylaxis was administered. VTE prophylaxis was in place. General endotracheal anesthesia was then administered and tolerated well. After the induction, the abdomen was prepped and draped in the sterile fashion. The patient was positioned in supine position.  Palmer's point was identified and veres needle placed, saline drop test was appropiate as well as pressures. pneumoperitoneum was obtained, no hemodynamic changes were apparent. . Total of four  8 mm robotic ports were placed under direct visualization.  Patient was positioned in steep trendelenburg and left side up. Robot was brought to the field and docked in the standard fashion. WE maintained visualization of our instruments at all times and avoided any collision between arms. I scrubbed out and went to the console. There were  adhesions from sigmoid to the abdominal wall that  where lysed in the standard fashion with the scissors. THe sigmoid colon was mobilized lateral to medial to allow the creation of loop colostomy. 2-0 silk placed in the selected area of the colon that reached to the abdominal wall w/o tension. I created an incision to the the left of the umbilicus per ostomy nurse marking. My assistant placed PMI and silk suture brought outside the abdominal wall so the colon was brought to the abdominal wall Second look revealed no evidence of bowel injuries and  surgical file has also excellent hemostasis.  All the laparoscopic ports were removed and a second look showed no evidence of any bleeding or any other injuries.  Marcaine  was infiltrated at all incision sites in a full thickness fashion. The skin incisions were closed with 4-0 Monocryl. Dermabond was used to coat all the skin incisions.   I was able to matured the colostomy after incising fascia under direct visualization and mature the stoma using 3-0 vicryl in a rosebud fashion, afferent and efferent loop were widely patent. Stoma appliance placed. Needle and laparotomy count were correct and there were no immediate complications.  Laneta Luna, MD, FACS

## 2024-05-06 NOTE — Anesthesia Procedure Notes (Signed)
 Procedure Name: Intubation Date/Time: 05/06/2024 1:36 PM  Performed by: Lennie Lamarr HERO, CRNAPre-anesthesia Checklist: Patient identified, Emergency Drugs available, Suction available and Patient being monitored Patient Re-evaluated:Patient Re-evaluated prior to induction Oxygen Delivery Method: Circle System Utilized Preoxygenation: Pre-oxygenation with 100% oxygen Induction Type: IV induction Ventilation: Mask ventilation without difficulty Laryngoscope Size: McGrath and 4 Grade View: Grade I Tube type: Oral Tube size: 7.0 mm Number of attempts: 1 Airway Equipment and Method: Stylet and Oral airway Placement Confirmation: ETT inserted through vocal cords under direct vision, positive ETCO2 and breath sounds checked- equal and bilateral Secured at: 22 cm Tube secured with: Tape Dental Injury: Teeth and Oropharynx as per pre-operative assessment

## 2024-05-06 NOTE — Anesthesia Preprocedure Evaluation (Addendum)
 "                                  Anesthesia Evaluation  Patient identified by MRN, date of birth, ID band Patient awake    Reviewed: Allergy & Precautions, NPO status , Patient's Chart, lab work & pertinent test results  Airway Mallampati: III  TM Distance: >3 FB Neck ROM: full    Dental  (+) Teeth Intact   Pulmonary neg pulmonary ROS   Pulmonary exam normal        Cardiovascular Exercise Tolerance: Good hypertension, Pt. on medications Normal cardiovascular exam Rhythm:Regular Rate:Normal     Neuro/Psych   Anxiety     negative neurological ROS  negative psych ROS   GI/Hepatic negative GI ROS, Neg liver ROS,GERD  Medicated,,  Endo/Other    Class 3 obesity  Renal/GU negative Renal ROS  negative genitourinary   Musculoskeletal  (+) Arthritis ,    Abdominal  (+) + obese  Peds negative pediatric ROS (+)  Hematology negative hematology ROS (+)   Anesthesia Other Findings Past Medical History: No date: Allergic rhinitis, cause unspecified No date: Allergy 2017: Anal fissure No date: Anemia No date: Anxiety state, unspecified No date: Arthritis No date: Cataract     Comment:  forming  No date: Colon polyps 12/01/2023: CSF leak No date: Depressive disorder, not elsewhere classified No date: Diffuse cystic mastopathy No date: Disorder of bone and cartilage, unspecified No date: Diverticulosis 2016: Estrogen deficiency No date: External hemorrhoids 2008: Fibrocystic breast disease No date: Gallstones No date: GERD (gastroesophageal reflux disease) No date: Glaucoma     Comment:  narrow angle thatwas corrected with laser surgery  01/07/2024: Hypokalemia 2023: Irregular heart beat 12/01/2023: Lumbar stenosis with neurogenic claudication No date: Osteoarthrosis, unspecified whether generalized or localized,  unspecified site     Comment:  hands No date: Osteoporosis No date: Pre-diabetes 2024: Prediabetes No date: Pure  hypercholesterolemia No date: Thyroid  disease     Comment:  years past- no meds currently  No date: Unspecified essential hypertension No date: Unspecified hemorrhoids without mention of complication  Past Surgical History: 08/11/2020: BREAST BIOPSY; Left     Comment:  11:00 3cmfn Q clip u/s bx papilloma 08/25/2020: BREAST EXCISIONAL BIOPSY; Left     Comment:  surgical exc papilloma with tag placement tag no.[id number] 08/25/2020: BREAST LUMPECTOMY WITH RADIO FREQUENCY LOCALIZER; Left     Comment:  Procedure: BREAST LUMPECTOMY WITH RADIO FREQUENCY               LOCALIZER;  Surgeon: Tye Millet, DO;  Location: [facility]               ORS;  Service: General;  Laterality: Left; 10/1999: BUNIONECTOMY; Left 1981: CESAREAN SECTION No date: CHOLECYSTECTOMY 01/2002: COLONOSCOPY     Comment:  polyp; hemorrhoids;diverticulosis 2013: COLONOSCOPY     Comment:  diverticulosis 06/2000: DEXA     Comment:  osteopenia 04/2003: DEXA     Comment:  Stable No date: HAND SURGERY; Left     Comment:  DIP fusion-left No date: HEMORRHOID SURGERY 12/01/2023: LUMBAR LAMINECTOMY/DECOMPRESSION MICRODISCECTOMY; N/A     Comment:  Procedure: LUMBAR LAMINECTOMY/DECOMPRESSION               MICRODISCECTOMY 4 LEVEL;  Surgeon: Clois Fret,               MD;  Location: [facility] ORS;  Service: Neurosurgery;  Laterality: N/A;  L2-S1 POSTERIOR SPINAL DECOMPRESSION No date: POLYPECTOMY No date: TONSILLECTOMY     Comment:  childhood     Reproductive/Obstetrics negative OB ROS                              Anesthesia Physical Anesthesia Plan  ASA: 2  Anesthesia Plan: General   Post-op Pain Management:    Induction: Intravenous  PONV Risk Score and Plan: Ondansetron , Dexamethasone , Midazolam  and Treatment may vary due to age or medical condition  Airway Management Planned: Oral ETT  Additional Equipment:   Intra-op Plan:   Post-operative Plan: Extubation in  OR  Informed Consent: I have reviewed the patients History and Physical, chart, labs and discussed the procedure including the risks, benefits and alternatives for the proposed anesthesia with the patient or authorized representative who has indicated his/her understanding and acceptance.     Dental Advisory Given  Plan Discussed with: CRNA  Anesthesia Plan Comments:          Anesthesia Quick Evaluation  "

## 2024-05-06 NOTE — Plan of Care (Signed)
" °  Problem: Education: Goal: Knowledge of the prescribed therapeutic regimen will improve Outcome: Progressing   Problem: Clinical Measurements: Goal: Ability to maintain clinical measurements within normal limits Outcome: Progressing Goal: Postoperative complications will be avoided or minimized Outcome: Progressing   Problem: Skin Integrity: Goal: Demonstrates signs of wound healing without infection Outcome: Progressing   Problem: Nutrition: Goal: Adequate nutrition will be maintained Outcome: Progressing   Problem: Coping: Goal: Level of anxiety will decrease Outcome: Progressing   Problem: Pain Managment: Goal: General experience of comfort will improve and/or be controlled Outcome: Progressing   Problem: Safety: Goal: Ability to remain free from injury will improve Outcome: Progressing   "

## 2024-05-06 NOTE — OR Nursing (Signed)
 Patient states she had a reaction to the skin prep used on her back for her last surgery. Nurse accedentily prepped with cloraprep this time. Abdomen washed off with water and towel, then reprepped with Betatine per Dr. Jordis. Benadryl  given by anesthesia per Dr. Dolph request.

## 2024-05-06 NOTE — TOC Initial Note (Addendum)
 Transition of Care [facility]) - Initial/Assessment Note    Patient Details  Name: Sierra Sparks MRN: [id number] Date of Birth: [birth year]  [facility] of [facility]) CM/SW Contact:    Corean ONEIDA Haddock, RN Phone Number: 05/06/2024, 7:55 PM  Clinical Narrative:                    Admitted qnm:Mnanupr assisted Laparoscopic loop sigmoid colostomy  Admitted from:home with spouse.  She states 2 sons, and sister live locally for support PCP: [facility]  Current home health/prior home health/DME: [facility]  Met with patient at bedside to discuss option of home health.  She states that that she would like to use [facility] for RN.  She states she does not feel like home therapy is indicated  She confirms that her spouse will be able to attend ostomy education while inpatient.  She states family will be available for transport at discharge  [name] is able to accept.  Accepted in HUB and notified Cory with [facility]   Patient currently on acute O2   Patient Goals and CMS Choice            Expected Discharge Plan and Services                                              Prior Living Arrangements/Services                       Activities of Daily Living   ADL Screening (condition at time of admission) Independently performs ADLs?: Yes (appropriate for developmental age) Is the patient deaf or have difficulty hearing?: Yes Does the patient have difficulty seeing, even when wearing glasses/contacts?: No Does the patient have difficulty concentrating, remembering, or making decisions?: No  Permission Sought/Granted                  Emotional Assessment              Admission diagnosis:  Full incontinence of feces [R15.9] History of creation of ostomy [facility]) [Z93.9] Incontinence of bowel [R15.9] Patient Active Problem List   Diagnosis Date Noted   Full incontinence of feces 05/06/2024   History of creation of ostomy (HCC) 05/06/2024   Incontinence of  bowel 05/06/2024   Constipation 01/07/2024   Hypokalemia 01/07/2024   Status post lumbar spine operation 12/01/2023   Lumbar stenosis with neurogenic claudication 12/01/2023   CSF leak 12/01/2023   Pre-operative clearance 11/24/2023   Falls frequently 06/12/2023   Peripheral vertigo 12/17/2022   Prediabetes 05/23/2022   Irregular heart beat 06/22/2021   Colon cancer screening 05/18/2021   Encounter for screening mammogram for breast cancer 03/31/2017   Hearing loss in right ear 08/30/2016   Anal fissure 03/06/2016   Internal hemorrhoids 02/26/2016   Routine general medical examination at a health care facility 02/03/2015   Estrogen deficiency 02/03/2015   Encounter for Medicare annual wellness exam 08/12/2012   Glaucoma 08/12/2012   History of colon polyps 05/15/2011   Anemia 05/15/2011   Allergic rhinitis 03/02/2008   Osteoporosis 01/05/2007   HYPERCHOLESTEROLEMIA 10/24/2006   Depression with anxiety 10/24/2006   Essential hypertension 10/24/2006   HEMORRHOIDS 10/24/2006   FIBROCYSTIC BREAST DISEASE 10/24/2006   OSTEOARTHRITIS 10/24/2006   PCP:  Randeen Laine LABOR, MD Pharmacy:   [facility] [address], [place] - [address] [place] [address] Phone: [phone] Fax: [phone]     Social Drivers of Health (SDOH) Social History: SDOH Screenings   Food Insecurity: No Food Insecurity (05/06/2024)  Housing: Low Risk (05/06/2024)  Transportation Needs: No Transportation Needs (05/06/2024)  Utilities: Not At Risk (05/06/2024)  Alcohol Screen: Low Risk (05/30/2023)  Depression (PHQ2-9): Low Risk (01/07/2024)  Financial Resource Strain: Low Risk (05/30/2023)  Physical Activity: Inactive (05/30/2023)  Social Connections: Socially Integrated (05/06/2024)  Stress: No Stress Concern Present (05/30/2023)  Tobacco Use: Low Risk (05/06/2024)  Health Literacy: Adequate Health Literacy (05/30/2023)   SDOH Interventions:     Readmission Risk  Interventions     No data to display

## 2024-05-06 NOTE — Interval H&P Note (Signed)
 History and Physical Interval Note:  05/06/2024 12:05 PM  Sierra Sparks  has presented today for surgery, with the diagnosis of Full incontinence of feces.  The various methods of treatment have been discussed with the patient and family. After consideration of risks, benefits and other options for treatment, the patient has consented to  Procedures: CREATION, COLOSTOMY, ROBOT-ASSISTED (N/A) as a surgical intervention.  The patient's history has been reviewed, patient examined, no change in status, stable for surgery.  I have reviewed the patient's chart and labs.  Questions were answered to the patient's satisfaction.     [name]

## 2024-05-06 NOTE — H&P (Signed)
 "   Sierra Sparks is an 84 y.o. female.       Chief Complaint  Patient presents with   Follow-up      HPI: Sierra Sparks is a 84 y.o. female seen in F/U for rectal bleeding and pain.  On 8/18 she underwent L2-S1 lumbar decompression including central laminectomy and bilateral medial facetectomies including foraminotomies.  She has had issues with pain.  She reports having  intermittent bloody bm,  She also reports rectal pain moderate intensity and sharp worsening when having bowel movements.  Prior history of hemorrhoids.  She underwent a CTA that have personally reviewed showing no evidence of active bleeding. Now has significant diarrhea w incontinence  She also has a type III large paraesophageal hernia, Sh does endorse reflux but no dysphagia, She has known of this hernia for a while.  She has on a stable hemoglobin. SHe has issues with urinary and fecal incontinence.  This seems to be a progression. SHe has gone to need to have perirectal pain.  More importantly she is more concerned about fecal incontinence.  We did have a good discussion regarding potential loop colostomy for quality-of-life improvement./We talked about potentially about doing this electively and this in theory might help with the fissure as well.  She seems to have significant quality-of-life issues related to incontinence       Past Medical History:  Diagnosis Date   Allergic rhinitis, cause unspecified     Allergy     Anemia     Anxiety state, unspecified     Arthritis     Cataract      forming    Colon polyps     Depressive disorder, not elsewhere classified     Diffuse cystic mastopathy     Disorder of bone and cartilage, unspecified     Diverticulosis     External hemorrhoids     Gallstones     GERD (gastroesophageal reflux disease)     Glaucoma      narrow angle thatwas corrected with laser surgery    Osteoarthrosis, unspecified whether generalized or localized, unspecified site      hands    Osteoporosis     Pre-diabetes     Pure hypercholesterolemia     Thyroid  disease      years past- no meds currently    Unspecified essential hypertension     Unspecified hemorrhoids without mention of complication                 Past Surgical History:  Procedure Laterality Date   BREAST BIOPSY Left 08/11/2020    11:00 3cmfn Q clip u/s bx papilloma   BREAST EXCISIONAL BIOPSY Left 08/25/2020    surgical exc papilloma with tag placement tag no.[id number]   BREAST LUMPECTOMY WITH RADIO FREQUENCY LOCALIZER Left 08/25/2020    Procedure: BREAST LUMPECTOMY WITH RADIO FREQUENCY LOCALIZER;  Surgeon: Tye Millet, DO;  Location: [facility] ORS;  Service: General;  Laterality: Left;   BUNIONECTOMY Left 10/1999   CESAREAN SECTION   1981   CHOLECYSTECTOMY       COLONOSCOPY   01/2002    polyp; hemorrhoids;diverticulosis   COLONOSCOPY   2013    diverticulosis   DEXA   06/2000    osteopenia   DEXA   04/2003    Stable   HAND SURGERY Left      DIP fusion-left   HEMORRHOID SURGERY       LUMBAR LAMINECTOMY/DECOMPRESSION MICRODISCECTOMY N/A 12/01/2023  Procedure: LUMBAR LAMINECTOMY/DECOMPRESSION MICRODISCECTOMY 4 LEVEL;  Surgeon: Clois Fret, MD;  Location: [facility] ORS;  Service: Neurosurgery;  Laterality: N/A;  L2-S1 POSTERIOR SPINAL DECOMPRESSION   POLYPECTOMY       TONSILLECTOMY        childhood               Family History  Problem Relation Age of Onset   Stroke Mother     Coronary artery disease Mother 28   Hypertension Mother     Hyperlipidemia Mother     Arthritis Father     Lung cancer Father          + smoker   Hypertension Sister     Migraines Sister     Colon cancer Brother          ? primary colon   Liver cancer Brother     Cancer Brother          type unknown   Stroke Maternal Grandmother          hemorrhagic   Cancer Maternal Grandmother          female cancer type unknown   Bone cancer Paternal Uncle     Esophageal cancer Neg Hx     Rectal cancer Neg Hx      Stomach cancer Neg Hx     Breast cancer Neg Hx     Colon polyps Neg Hx            Social History:  reports that she has never smoked. She has never been exposed to tobacco smoke. She has never used smokeless tobacco. She reports current alcohol use of about 3.0 standard drinks of alcohol per week. She reports that she does not use drugs.   Allergies: [Allergies]  [Allergies] No Known Allergies   Medications reviewed.       ROS Full ROS performed and is otherwise negative other than what is stated in HPI    Physical Exam    CONSTITUTIONAL: She is in no acute distress.  Chaperone present. EYES: Pupils are equal, round, Sclera are non-icteric. EARS, NOSE, MOUTH AND THROAT: The oropharynx is clear. The oral mucosa is pink and moist. Hearing is intact to voice. LYMPH NODES:  Lymph nodes in the neck are normal. GI: The abdomen is  soft, nontender, and nondistended. There are no palpable masses. There is no hepatosplenomegaly. There are normal bowel sounds in all quadrants. MUSCULOSKELETAL: Normal muscle strength and tone. No cyanosis or edema.   SKIN: Turgor is good and there are no pathologic skin lesions or ulcers. NEUROLOGIC: Motor and sensation is grossly normal. Cranial nerves are grossly intact. PSYCH:  Oriented to person, place and time. Affect is normal.     Assessment/Plan: 84 year old female with persistent fecal incontinence.  I had another Extensive discussion with the patient and her husband. She is to the point that she is not having any quality of life and having severe anxiety due to persistent fecal incontinence.  Discussed again the potential option for a loop sigmoid colostomy to improve quality of life. She is to the point that she either have a colostomy done continue to have fecal incontinence. I do think that we can do this robotically.  Procedure discussed with the patient in detail.  The risk the benefits and the possible complications including but not  limited to: Bleeding, infection bowel injury, pain she understands and wished to proceed "

## 2024-05-07 ENCOUNTER — Other Ambulatory Visit: Payer: Self-pay

## 2024-05-07 ENCOUNTER — Encounter: Payer: Self-pay | Admitting: Surgery

## 2024-05-07 DIAGNOSIS — Z433 Encounter for attention to colostomy: Secondary | ICD-10-CM

## 2024-05-07 LAB — CBC
HCT: 35.5 % — ABNORMAL LOW (ref 36.0–46.0)
Hemoglobin: 11.5 g/dL — ABNORMAL LOW (ref 12.0–15.0)
MCH: 30 pg (ref 26.0–34.0)
MCHC: 32.4 g/dL (ref 30.0–36.0)
MCV: 92.7 fL (ref 80.0–100.0)
Platelets: 308 K/uL (ref 150–400)
RBC: 3.83 MIL/uL — ABNORMAL LOW (ref 3.87–5.11)
RDW: 14.2 % (ref 11.5–15.5)
WBC: 10.6 K/uL — ABNORMAL HIGH (ref 4.0–10.5)
nRBC: 0 % (ref 0.0–0.2)

## 2024-05-07 LAB — BASIC METABOLIC PANEL WITH GFR
Anion gap: 13 (ref 5–15)
BUN: 36 mg/dL — ABNORMAL HIGH (ref 8–23)
CO2: 21 mmol/L — ABNORMAL LOW (ref 22–32)
Calcium: 8.6 mg/dL — ABNORMAL LOW (ref 8.9–10.3)
Chloride: 104 mmol/L (ref 98–111)
Creatinine, Ser: 1.63 mg/dL — ABNORMAL HIGH (ref 0.44–1.00)
GFR, Estimated: 31 mL/min — ABNORMAL LOW
Glucose, Bld: 173 mg/dL — ABNORMAL HIGH (ref 70–99)
Potassium: 4.5 mmol/L (ref 3.5–5.1)
Sodium: 137 mmol/L (ref 135–145)

## 2024-05-07 MED ORDER — OXYCODONE HCL 10 MG PO TABS
5.0000 mg | ORAL_TABLET | ORAL | 0 refills | Status: AC | PRN
  Filled 2024-05-07: qty 10, 4d supply, fill #0

## 2024-05-07 MED ORDER — ALBUMIN HUMAN 25 % IV SOLN
25.0000 g | Freq: Once | INTRAVENOUS | Status: AC
  Administered 2024-05-07: 25 g via INTRAVENOUS
  Filled 2024-05-07: qty 100

## 2024-05-07 NOTE — Consult Note (Signed)
 WOC Nurse ostomy consult note Stoma type/location: LLQ, end colostomy for fecal incontinence  Stomal assessment/size:  1 3/8 slightly oval shaped, flush with the skin, pink, moist  Peristomal assessment: intact  Treatment options for stomal/peristomal skin: 2 barrier ring Output liquid brown 50CC Ostomy pouching: 2pc. 2 1/4 with 2 barrier ring  Education provided:  Explained role of ostomy nurse and creation of stoma  Explained stoma characteristics (budded, flush, color, texture, care) Demonstrated pouch change (cutting new skin barrier, measuring stoma, cleaning peristomal skin and stoma, use of barrier ring) Allowed patient to cut skin barrier, she did well but we resized once the old pouch was off  Education on emptying when 1/3 to 1/2 full and how to empty Demonstrated use of wick to clean spout  Discussed bathing, diet, gas, medication use Discussed risk of peristomal hernia Provided patient with [employer] and marked items currently using Answered patient/family questions:  none  Husband and sister at bedside for support with pouch change education.  Outpatient clinic appt. For 1/30 @ 930 am. Patient and family aware Supplies in the room 5 sets of pouches and barrier rings Patient to [facility] with [facility] nursing Provided patient with WOC contact information.    Enrolled patient in [employer] DC program: Yes  WOC Nurse will follow along with you for continued support with ostomy teaching and care Krishna Heuer [facility] MSN, RN, [place], CNS, [place] [phone]

## 2024-05-07 NOTE — TOC Transition Note (Signed)
 Transition of Care [facility]) - Discharge Note   Patient Details  Name: BRYANNAH BOSTON MRN: [id number] Date of Birth: [birth year]  [facility]) CM/SW Contact:  Alfonso Rummer, LCSW Phone Number: 05/07/2024, 10:47 AM   Clinical Narrative:     Pt will discharge home with self care. Family at bedside and will transport patient home.   Final next level of care: Home/Self Care Barriers to Discharge: Barriers Resolved   Patient Goals and CMS Choice            Discharge Placement                  Name of family member notified: family at bedside Patient and family notified of of transfer: 05/07/24  Discharge Plan and Services Additional resources added to the After Visit Summary for                                       Social Drivers of Health (SDOH) Interventions SDOH Screenings   Food Insecurity: No Food Insecurity (05/06/2024)  Housing: Low Risk (05/06/2024)  Transportation Needs: No Transportation Needs (05/06/2024)  Utilities: Not At Risk (05/06/2024)  Alcohol Screen: Low Risk (05/30/2023)  Depression (PHQ2-9): Low Risk (01/07/2024)  Financial Resource Strain: Low Risk (05/30/2023)  Physical Activity: Inactive (05/30/2023)  Social Connections: Socially Integrated (05/06/2024)  Stress: No Stress Concern Present (05/30/2023)  Tobacco Use: Low Risk (05/06/2024)  Health Literacy: Adequate Health Literacy (05/30/2023)     Readmission Risk Interventions     No data to display

## 2024-05-07 NOTE — Anesthesia Postprocedure Evaluation (Signed)
"   Anesthesia Post Note  Patient: Sierra Sparks  Procedure(s) Performed: CREATION, COLOSTOMY, ROBOT-ASSISTED  Patient location during evaluation: PACU Anesthesia Type: General Level of consciousness: awake and alert, oriented and patient cooperative Pain management: pain level controlled Vital Signs Assessment: post-procedure vital signs reviewed and stable Respiratory status: spontaneous breathing, nonlabored ventilation and respiratory function stable Cardiovascular status: blood pressure returned to baseline and stable Postop Assessment: adequate PO intake Anesthetic complications: no   No notable events documented.   Last Vitals:  Vitals:   05/06/24 2021 05/07/24 0400  BP: 117/66 115/67  Pulse: 81 82  Resp: 16 17  Temp: 36.8 C 36.7 C  SpO2: 92% 93%    Last Pain:  Vitals:   05/06/24 2149  TempSrc:   PainSc: 0-No pain                 Alfonso Ruths      "

## 2024-05-07 NOTE — Discharge Summary (Signed)
 [facility] SURGICAL DISCHARGE SUMMARY  Patient ID: Sierra Sparks MRN: [id number] DOB/AGE: 14-Nov-1940 84 y.o.  Admit date: 05/06/2024 Discharge date: 05/07/2024  Discharge Diagnoses Patient Active Problem List   Diagnosis Date Noted   Full incontinence of feces 05/06/2024   History of creation of ostomy [facility]) 05/06/2024   Incontinence of bowel 05/06/2024    Consultants WOC  Procedures 05/06/2024:  Robotic assisted laparoscopic loop colostomy creation   HPI: Sierra Sparks is 84 y.o. female with history of fecal incontinence who presents to [facility] for loop colostomy creation  Hospital Course: Informed consent was obtained and documented, and patient underwent uneventful Robotic assisted laparoscopic loop colostomy creation (Dr Jordis, 05/06/2024).  Post-operatively, patient did well with return of bowel function. The remainder of patient's hospital course was essentially unremarkable, and discharge planning was initiated accordingly with patient safely able to be discharged home with appropriate discharge instructions, pain control, and outpatient follow-up after all of her questions were answered to her expressed satisfaction.   Discharge Condition: Good   Physical Examination:  Constitutional: Well appearing female, NAD Pulmonary: Normal effort, no respiratory distress Gastrointestinal: Soft, non-tender, non-distended, loop colostomy in left abdomen; gas and liquid stool in bag  Skin: Laparoscopic incisions are CDI with dermabond    Allergies as of 05/07/2024       Reactions   Other Itching, Other (See Comments)   Cleaning agent used on pt's back prior to her last procedure caused welts        Medication List     STOP taking these medications    bisacodyl  5 MG EC tablet Commonly known as: DULCOLAX   metroNIDAZOLE  500 MG tablet Commonly known as: FLAGYL    neomycin  500 MG tablet Commonly known as: MYCIFRADIN    polyethylene glycol powder  17 GM/SCOOP powder Commonly known as: MiraLax        TAKE these medications    acetaminophen  500 MG tablet Commonly known as: TYLENOL  Take 1,000 mg by mouth every 6 (six) hours as needed for moderate pain (pain score 4-6).   amLODipine  5 MG tablet Commonly known as: NORVASC  Take 1 tablet by mouth once daily   Calcium  1200 1200-1000 MG-UNIT Chew Chew 600 mg by mouth 2 (two) times daily.   ezetimibe  10 MG tablet Commonly known as: ZETIA  Take 1 tablet by mouth once daily   famotidine -calcium  carbonate-magnesium  hydroxide 10-800-165 MG chewable tablet Commonly known as: PEPCID  COMPLETE Chew 1 tablet by mouth daily as needed (heartburn/indigestion).   Ferrex 150 150 MG capsule Generic drug: iron  polysaccharides Take 1 capsule by mouth twice daily   FLUoxetine  20 MG capsule Commonly known as: PROZAC  Take 1 capsule by mouth once daily   fluticasone  50 MCG/ACT nasal spray Commonly known as: FLONASE  Place 2 sprays into both nostrils daily.   meclizine  25 MG tablet Commonly known as: ANTIVERT  Take 1 tablet (25 mg total) by mouth 3 (three) times daily as needed for dizziness.   meloxicam  7.5 MG tablet Commonly known as: MOBIC  Take 1 tablet by mouth once daily   multivitamin with minerals Tabs tablet Take 1 tablet by mouth in the morning.   Oxycodone  HCl 10 MG Tabs Take 0.5 tablets (5 mg total) by mouth every 4 (four) hours as needed for severe pain (pain score 7-10).   rosuvastatin  5 MG tablet Commonly known as: CRESTOR  Take 1 tablet by mouth once daily   Vitamin C 500 MG Caps Take 500 mg by mouth in the morning.   vitamin D3 25 MCG  tablet Commonly known as: CHOLECALCIFEROL Take 1,000 Units by mouth in the morning.          Contact information for follow-up providers     Jordis Laneta FALCON, MD. Go on 05/24/2024.   Specialty: General Surgery Why: Go to appointment on 02/09 at 915 AM Contact information: [address] [place]  [address] [phone]              Contact information for after-discharge care     Home Medical Care     [facility]) .   Service: Home Health Services Contact information: [address] [place]  [address] [phone]                      Time spent on discharge management including discussion of hospital course, clinical condition, outpatient instructions, prescriptions, and follow up with the patient and members of the medical team: >30 minutes  -- Arthea Platt , PA-C [facility]  05/07/2024, 9:32 AM [phone] M-F: 7am - 4pm

## 2024-05-07 NOTE — Discharge Instructions (Addendum)
 In addition to included general post-operative instructions ,  Diet: Resume home diet.   Activity: No heavy lifting >20 pounds (children, pets, laundry, garbage) or strenuous activity for 4 weeks, but light activity and walking are encouraged. Do not drive or drink alcohol if taking narcotic pain medications or having pain that might distract from driving.  Wound care: You may shower/get incision wet with soapy water and pat dry (do not rub incisions), but no baths or submerging incision underwater until follow-up.   Medications: Resume all home medications. For mild to moderate pain: acetaminophen  (Tylenol ) or ibuprofen/naproxen (if no kidney disease). Combining Tylenol  with alcohol can substantially increase your risk of causing liver disease. Narcotic pain medications, if prescribed, can be used for severe pain, though may cause nausea, constipation, and drowsiness. Do not combine Tylenol  and Percocet (or similar) within a 6 hour period as Percocet (and similar) contain(s) Tylenol . If you do not need the narcotic pain medication, you do not need to fill the prescription.  Call office [phone] / [phone]) at any time if any questions, worsening pain, fevers/chills, bleeding, drainage from incision site, or other concerns.  [facility] will provide home health nursing please call [name] for questions or concerns regarding home health needs   [address], [place] [address] [phone] phone [phone] fax

## 2024-05-11 ENCOUNTER — Other Ambulatory Visit: Payer: Self-pay | Admitting: *Deleted

## 2024-05-11 DIAGNOSIS — R112 Nausea with vomiting, unspecified: Secondary | ICD-10-CM

## 2024-05-11 MED ORDER — ONDANSETRON HCL 4 MG PO TABS: 4.0000 mg | ORAL_TABLET | Freq: Four times a day (QID) | ORAL | 0 refills | Status: AC

## 2024-05-13 ENCOUNTER — Telehealth: Payer: Self-pay

## 2024-05-13 DIAGNOSIS — I10 Essential (primary) hypertension: Secondary | ICD-10-CM

## 2024-05-14 ENCOUNTER — Other Ambulatory Visit: Payer: Self-pay | Admitting: Family Medicine

## 2024-05-14 ENCOUNTER — Other Ambulatory Visit (HOSPITAL_COMMUNITY): Payer: Self-pay | Admitting: Nurse Practitioner

## 2024-05-14 ENCOUNTER — Ambulatory Visit (HOSPITAL_COMMUNITY)
Admission: RE | Admit: 2024-05-14 | Discharge: 2024-05-14 | Disposition: A | Source: Ambulatory Visit | Attending: *Deleted | Admitting: *Deleted

## 2024-05-14 ENCOUNTER — Telehealth: Payer: Self-pay

## 2024-05-14 DIAGNOSIS — L24B3 Irritant contact dermatitis related to fecal or urinary stoma or fistula: Secondary | ICD-10-CM | POA: Insufficient documentation

## 2024-05-14 DIAGNOSIS — Z433 Encounter for attention to colostomy: Secondary | ICD-10-CM | POA: Diagnosis present

## 2024-05-14 DIAGNOSIS — K5900 Constipation, unspecified: Secondary | ICD-10-CM | POA: Insufficient documentation

## 2024-05-14 NOTE — Progress Notes (Unsigned)
 Complex Care Management Note Care Guide Note  05/14/2024 Name: Sierra Sparks MRN: [id number] DOB: 01/23/1941   Complex Care Management Outreach Attempts: An unsuccessful telephone outreach was attempted today to offer the patient information about available complex care management services.  Follow Up Plan:  Additional outreach attempts will be made to offer the patient complex care management information and services.   Encounter Outcome:  Patient Request to Call Back  [facility]  [facility] VBCI Assistant Direct Dial: [phone]  Fax: [phone]

## 2024-05-14 NOTE — Progress Notes (Signed)
 [facility]   Reason for visit:  LLQ end colostomy.  Some leaks.  She is here with her sister and her spouse   Patient is performing her own care.  Spouse on stand by as needed.  HPI:  Fecal incontinence with creation of end colostomy  Past Medical History:  Diagnosis Date   Allergic rhinitis, cause unspecified    Allergy    Anal fissure 2017   Anemia    Anxiety state, unspecified    Arthritis    Cataract    forming    Colon polyps    CSF leak 12/01/2023   Depressive disorder, not elsewhere classified    Diffuse cystic mastopathy    Disorder of bone and cartilage, unspecified    Diverticulosis    Estrogen deficiency 2016   External hemorrhoids    Fibrocystic breast disease 2008   Gallstones    GERD (gastroesophageal reflux disease)    Glaucoma    narrow angle thatwas corrected with laser surgery    Hypokalemia 01/07/2024   Irregular heart beat 2023   Lumbar stenosis with neurogenic claudication 12/01/2023   Osteoarthrosis, unspecified whether generalized or localized, unspecified site    hands   Osteoporosis    Pre-diabetes    Prediabetes 2024   Pure hypercholesterolemia    Thyroid  disease    years past- no meds currently    Unspecified essential hypertension    Unspecified hemorrhoids without mention of complication    Family History  Problem Relation Age of Onset   Stroke Mother    Coronary artery disease Mother 5   Hypertension Mother    Hyperlipidemia Mother    Arthritis Father    Lung cancer Father        + smoker   Hypertension Sister    Migraines Sister    Colon cancer Brother        ? primary colon   Liver cancer Brother    Cancer Brother        type unknown   Stroke Maternal Grandmother        hemorrhagic   Cancer Maternal Grandmother        female cancer type unknown   Bone cancer Paternal Uncle    Esophageal cancer Neg Hx    Rectal cancer Neg Hx    Stomach cancer Neg Hx    Breast cancer Neg Hx    Colon polyps Neg Hx     Allergies[1] Current Outpatient Medications  Medication Sig Dispense Refill Last Dose/Taking   acetaminophen  (TYLENOL ) 500 MG tablet Take 1,000 mg by mouth every 6 (six) hours as needed for moderate pain (pain score 4-6).      amLODipine  (NORVASC ) 5 MG tablet Take 1 tablet by mouth once daily 90 tablet 1    Ascorbic Acid (VITAMIN C) 500 MG CAPS Take 500 mg by mouth in the morning.      Calcium  Carbonate-Vit D-Min (CALCIUM  1200) 1200-1000 MG-UNIT CHEW Chew 600 mg by mouth 2 (two) times daily.      ezetimibe  (ZETIA ) 10 MG tablet Take 1 tablet by mouth once daily 90 tablet 0    famotidine -calcium  carbonate-magnesium  hydroxide (PEPCID  COMPLETE) 10-800-165 MG chewable tablet Chew 1 tablet by mouth daily as needed (heartburn/indigestion).      FLUoxetine  (PROZAC ) 20 MG capsule Take 1 capsule by mouth once daily 90 capsule 0    fluticasone  (FLONASE ) 50 MCG/ACT nasal spray Place 2 sprays into both nostrils daily. 16 g 6    iron  polysaccharides (  FERREX 150) 150 MG capsule Take 1 capsule by mouth twice daily 180 capsule 2    meclizine  (ANTIVERT ) 25 MG tablet Take 1 tablet (25 mg total) by mouth 3 (three) times daily as needed for dizziness. 30 tablet 0    meloxicam  (MOBIC ) 7.5 MG tablet Take 1 tablet by mouth once daily 90 tablet 0    Multiple Vitamin (MULTIVITAMIN WITH MINERALS) TABS tablet Take 1 tablet by mouth in the morning.      ondansetron  (ZOFRAN ) 4 MG tablet Take 1 tablet (4 mg total) by mouth every 6 (six) hours. 20 tablet 0    Oxycodone  HCl 10 MG TABS Take 0.5 tablets (5 mg total) by mouth every 4 (four) hours as needed for severe pain (pain score 7-10). 10 tablet 0    rosuvastatin  (CRESTOR ) 5 MG tablet Take 1 tablet by mouth once daily 90 tablet 1    vitamin D3 (CHOLECALCIFEROL) 25 MCG tablet Take 1,000 Units by mouth in the morning.      No current facility-administered medications for this encounter.   ROS  Review of Systems  Constitutional:  Positive for fatigue.  Respiratory:  Negative.    Cardiovascular: Negative.   Gastrointestinal:  Positive for constipation.       Colostomy  Neurological:  Positive for numbness.       Fecal incontinence that began after spinal surgery.   All other systems reviewed and are negative.  Vital signs:  BP 130/64 (BP Location: Right Arm)   Pulse 72   Temp 97.7 F (36.5 C) (Oral)   Resp 18   SpO2 94%  Exam:  Physical Exam Vitals reviewed.  Constitutional:      Appearance: Normal appearance.  HENT:     Mouth/Throat:     Mouth: Mucous membranes are moist.  Cardiovascular:     Rate and Rhythm: Normal rate.  Pulmonary:     Effort: Pulmonary effort is normal.  Abdominal:     Palpations: Abdomen is soft.  Neurological:     Mental Status: She is alert and oriented to person, place, and time.     Sensory: Sensory deficit present.     Comments: Incontinence of bowel and neurogenic bladder.  CSF leak after lumbar surgery  Psychiatric:        Behavior: Behavior normal.        Thought Content: Thought content normal.     Comments: Tearful at struggles to manage ostomy care.      Stoma type/location:  LLQ colostomy  Stomal assessment/size:  1 1/2 flush pink and moist Peristomal assessment:  denuded skin, creasing at 3 o'clock.  Treatment options for stomal/peristomal skin: stoma powder and skin prep barrier ring  Output: thick brown stool  Ostomy pouching:  switching to 1pc. Soft convex for increased security and comfort.  She has had multiple leaks and is frustrated today.  Education provided:  Due to rounded abdomen, stoma is hard for patient to visualize.  We discuss sitting in front of bathroom mirror.      Impression/dx  Colostomy  Irritant contact dermatitis Discussion  She is leaking on arrival today. She had not been able to securely snap the 2 piece closed.  She is in favor of a 1 piece pouch.   We discuss switching to convex for a more secure seal . She understands if her 2 piece supplies arrive, the flat  barrier will not seal as well.  I will provide HH with item numbers for 1 piece convex and the  2 piece convex barrier number if they aren't able to exchange the 2 piece for now.  She understands that [facility] can't send supplies until [facility] has discharged.   We discuss skin irritation and I demonstrate stoma powder and skin prep.   Her stool is thick and pasty.  She admits to poor appetite and not drinking enough fluids.  She will increase this.  I have recommended high fiber diet and adding colace if she can drink more water.  She has a lifelong history of constipation Plan  I perform pouch change and provide additional pouch sets.  HH has been out, but no ostomy teaching.  THey have ordered supplies but none have arrived.   I personally spent a total of 50 minutes in the care of the patient today including preparing to see the patient, getting/reviewing separately obtained history, performing a medically appropriate exam/evaluation, counseling and educating, placing orders, referring and communicating with other health care professionals, documenting clinical information in the EHR, and communicating results.    Visit time: 50 minutes.   Darice Cooley FNP-BC        [1]  Allergies Allergen Reactions   Other Itching and Other (See Comments)    Cleaning agent used on pt's back prior to her last procedure caused welts

## 2024-05-14 NOTE — Discharge Instructions (Signed)
 We are switching to a 1 piece convex  ITEM # [id number]   filtered with cera plus Stoma powder  7906 Skin prep  NO STING any brand  7917 Barrier ring  8805  Barrier extenders  [phone] Belt Large 7299 Try lubricating deodorant

## 2024-05-14 NOTE — Progress Notes (Unsigned)
 Complex Care Management Note Care Guide Note  05/14/2024 Name: Sierra Sparks MRN: [id number] DOB: [birth year]   Complex Care Management Outreach Attempts: A second unsuccessful outreach was attempted today to offer the patient with information about available complex care management services.  Follow Up Plan:  Additional outreach attempts will be made to offer the patient complex care management information and services.   Encounter Outcome:  No Answer  [facility]  [facility] VBCI Assistant Direct Dial: [phone]  Fax: [phone]

## 2024-05-24 ENCOUNTER — Encounter: Admitting: Surgery

## 2024-06-01 ENCOUNTER — Ambulatory Visit: Payer: Medicare PPO

## 2024-06-15 ENCOUNTER — Encounter: Admitting: Family Medicine
